# Patient Record
Sex: Female | Born: 1945 | Race: White | Hispanic: No | Smoking: Former smoker
Health system: Southern US, Community
[De-identification: ages and names within clinical notes are randomized; demographics above are authoritative.]

## PROBLEM LIST (undated history)

## (undated) DIAGNOSIS — J449 Chronic obstructive pulmonary disease, unspecified: Secondary | ICD-10-CM

## (undated) DIAGNOSIS — D62 Acute posthemorrhagic anemia: Secondary | ICD-10-CM

## (undated) DIAGNOSIS — K5903 Drug induced constipation: Secondary | ICD-10-CM

## (undated) DIAGNOSIS — I5021 Acute systolic (congestive) heart failure: Secondary | ICD-10-CM

## (undated) DIAGNOSIS — K259 Gastric ulcer, unspecified as acute or chronic, without hemorrhage or perforation: Secondary | ICD-10-CM

## (undated) DIAGNOSIS — S22080A Wedge compression fracture of T11-T12 vertebra, initial encounter for closed fracture: Secondary | ICD-10-CM

## (undated) DIAGNOSIS — I10 Essential (primary) hypertension: Secondary | ICD-10-CM

## (undated) DIAGNOSIS — Z720 Tobacco use: Secondary | ICD-10-CM

## (undated) DIAGNOSIS — F419 Anxiety disorder, unspecified: Secondary | ICD-10-CM

## (undated) DIAGNOSIS — I509 Heart failure, unspecified: Secondary | ICD-10-CM

## (undated) DIAGNOSIS — M1990 Unspecified osteoarthritis, unspecified site: Secondary | ICD-10-CM

## (undated) DIAGNOSIS — N179 Acute kidney failure, unspecified: Secondary | ICD-10-CM

## (undated) DIAGNOSIS — M8580 Other specified disorders of bone density and structure, unspecified site: Secondary | ICD-10-CM

## (undated) DIAGNOSIS — E785 Hyperlipidemia, unspecified: Secondary | ICD-10-CM

## (undated) DIAGNOSIS — K219 Gastro-esophageal reflux disease without esophagitis: Secondary | ICD-10-CM

## (undated) DIAGNOSIS — R570 Cardiogenic shock: Secondary | ICD-10-CM

## (undated) DIAGNOSIS — K449 Diaphragmatic hernia without obstruction or gangrene: Secondary | ICD-10-CM

## (undated) DIAGNOSIS — C3490 Malignant neoplasm of unspecified part of unspecified bronchus or lung: Secondary | ICD-10-CM

## (undated) HISTORY — DX: Wedge compression fracture of T11-T12 vertebra, initial encounter for closed fracture: S22.080A

## (undated) HISTORY — DX: Unspecified osteoarthritis, unspecified site: M19.90

## (undated) HISTORY — DX: Heart failure, unspecified: I50.9

## (undated) HISTORY — DX: Drug induced constipation: K59.03

## (undated) HISTORY — DX: Cardiogenic shock: R57.0

## (undated) HISTORY — DX: Acute kidney failure, unspecified: N17.9

## (undated) HISTORY — DX: Diaphragmatic hernia without obstruction or gangrene: K44.9

## (undated) HISTORY — DX: Anxiety disorder, unspecified: F41.9

## (undated) HISTORY — DX: Acute posthemorrhagic anemia: D62

## (undated) HISTORY — DX: Other specified disorders of bone density and structure, unspecified site: M85.80

## (undated) HISTORY — DX: Gastric ulcer, unspecified as acute or chronic, without hemorrhage or perforation: K25.9

## (undated) HISTORY — DX: Acute systolic (congestive) heart failure: I50.21

---

## 2010-04-05 ENCOUNTER — Emergency Department: Payer: Self-pay | Admitting: Emergency Medicine

## 2012-08-05 HISTORY — PX: LUNG CANCER SURGERY: SHX702

## 2015-12-26 DIAGNOSIS — C3490 Malignant neoplasm of unspecified part of unspecified bronchus or lung: Secondary | ICD-10-CM

## 2016-01-17 DIAGNOSIS — Z85118 Personal history of other malignant neoplasm of bronchus and lung: Secondary | ICD-10-CM

## 2016-04-18 DIAGNOSIS — M8589 Other specified disorders of bone density and structure, multiple sites: Secondary | ICD-10-CM | POA: Diagnosis not present

## 2016-04-18 DIAGNOSIS — Z85118 Personal history of other malignant neoplasm of bronchus and lung: Secondary | ICD-10-CM | POA: Diagnosis not present

## 2016-04-18 DIAGNOSIS — C3412 Malignant neoplasm of upper lobe, left bronchus or lung: Secondary | ICD-10-CM | POA: Diagnosis not present

## 2016-07-15 DIAGNOSIS — R1012 Left upper quadrant pain: Secondary | ICD-10-CM | POA: Diagnosis not present

## 2016-07-16 DIAGNOSIS — J449 Chronic obstructive pulmonary disease, unspecified: Secondary | ICD-10-CM | POA: Diagnosis not present

## 2016-07-16 DIAGNOSIS — S2242XA Multiple fractures of ribs, left side, initial encounter for closed fracture: Secondary | ICD-10-CM | POA: Diagnosis not present

## 2016-07-18 DIAGNOSIS — C3412 Malignant neoplasm of upper lobe, left bronchus or lung: Secondary | ICD-10-CM | POA: Diagnosis not present

## 2016-09-19 DIAGNOSIS — M8589 Other specified disorders of bone density and structure, multiple sites: Secondary | ICD-10-CM | POA: Diagnosis not present

## 2016-09-19 DIAGNOSIS — R978 Other abnormal tumor markers: Secondary | ICD-10-CM | POA: Diagnosis not present

## 2016-09-19 DIAGNOSIS — C3412 Malignant neoplasm of upper lobe, left bronchus or lung: Secondary | ICD-10-CM | POA: Diagnosis not present

## 2016-09-20 DIAGNOSIS — C3412 Malignant neoplasm of upper lobe, left bronchus or lung: Secondary | ICD-10-CM | POA: Diagnosis not present

## 2016-09-20 DIAGNOSIS — R110 Nausea: Secondary | ICD-10-CM | POA: Diagnosis not present

## 2016-09-23 DIAGNOSIS — C3490 Malignant neoplasm of unspecified part of unspecified bronchus or lung: Secondary | ICD-10-CM | POA: Diagnosis not present

## 2016-09-23 DIAGNOSIS — R110 Nausea: Secondary | ICD-10-CM | POA: Diagnosis not present

## 2016-09-23 DIAGNOSIS — C3412 Malignant neoplasm of upper lobe, left bronchus or lung: Secondary | ICD-10-CM | POA: Diagnosis not present

## 2016-10-16 DIAGNOSIS — M8580 Other specified disorders of bone density and structure, unspecified site: Secondary | ICD-10-CM | POA: Diagnosis not present

## 2016-10-16 DIAGNOSIS — D72829 Elevated white blood cell count, unspecified: Secondary | ICD-10-CM | POA: Diagnosis not present

## 2016-10-16 DIAGNOSIS — R110 Nausea: Secondary | ICD-10-CM | POA: Diagnosis not present

## 2016-10-16 DIAGNOSIS — C3490 Malignant neoplasm of unspecified part of unspecified bronchus or lung: Secondary | ICD-10-CM | POA: Diagnosis not present

## 2016-10-16 DIAGNOSIS — E860 Dehydration: Secondary | ICD-10-CM | POA: Diagnosis not present

## 2016-10-16 DIAGNOSIS — R109 Unspecified abdominal pain: Secondary | ICD-10-CM | POA: Diagnosis not present

## 2016-10-16 DIAGNOSIS — M545 Low back pain: Secondary | ICD-10-CM | POA: Diagnosis not present

## 2016-10-17 DIAGNOSIS — G8929 Other chronic pain: Secondary | ICD-10-CM | POA: Diagnosis not present

## 2016-10-17 DIAGNOSIS — Z720 Tobacco use: Secondary | ICD-10-CM | POA: Diagnosis not present

## 2016-10-17 DIAGNOSIS — I21A1 Myocardial infarction type 2: Secondary | ICD-10-CM | POA: Diagnosis not present

## 2016-10-17 DIAGNOSIS — N179 Acute kidney failure, unspecified: Secondary | ICD-10-CM | POA: Diagnosis not present

## 2016-10-17 DIAGNOSIS — Z79891 Long term (current) use of opiate analgesic: Secondary | ICD-10-CM | POA: Diagnosis not present

## 2016-10-17 DIAGNOSIS — R110 Nausea: Secondary | ICD-10-CM | POA: Diagnosis not present

## 2016-10-17 DIAGNOSIS — I249 Acute ischemic heart disease, unspecified: Secondary | ICD-10-CM | POA: Diagnosis not present

## 2016-10-17 DIAGNOSIS — J449 Chronic obstructive pulmonary disease, unspecified: Secondary | ICD-10-CM | POA: Diagnosis not present

## 2016-10-17 DIAGNOSIS — I129 Hypertensive chronic kidney disease with stage 1 through stage 4 chronic kidney disease, or unspecified chronic kidney disease: Secondary | ICD-10-CM | POA: Diagnosis present

## 2016-10-17 DIAGNOSIS — R4182 Altered mental status, unspecified: Secondary | ICD-10-CM | POA: Diagnosis not present

## 2016-10-17 DIAGNOSIS — K253 Acute gastric ulcer without hemorrhage or perforation: Secondary | ICD-10-CM | POA: Diagnosis not present

## 2016-10-17 DIAGNOSIS — R112 Nausea with vomiting, unspecified: Secondary | ICD-10-CM | POA: Diagnosis not present

## 2016-10-17 DIAGNOSIS — K5900 Constipation, unspecified: Secondary | ICD-10-CM | POA: Diagnosis present

## 2016-10-17 DIAGNOSIS — K824 Cholesterolosis of gallbladder: Secondary | ICD-10-CM | POA: Diagnosis not present

## 2016-10-17 DIAGNOSIS — R748 Abnormal levels of other serum enzymes: Secondary | ICD-10-CM | POA: Diagnosis not present

## 2016-10-17 DIAGNOSIS — F17210 Nicotine dependence, cigarettes, uncomplicated: Secondary | ICD-10-CM | POA: Diagnosis present

## 2016-10-17 DIAGNOSIS — K259 Gastric ulcer, unspecified as acute or chronic, without hemorrhage or perforation: Secondary | ICD-10-CM | POA: Diagnosis not present

## 2016-10-17 DIAGNOSIS — D649 Anemia, unspecified: Secondary | ICD-10-CM | POA: Diagnosis not present

## 2016-10-17 DIAGNOSIS — R740 Nonspecific elevation of levels of transaminase and lactic acid dehydrogenase [LDH]: Secondary | ICD-10-CM | POA: Diagnosis present

## 2016-10-17 DIAGNOSIS — I214 Non-ST elevation (NSTEMI) myocardial infarction: Secondary | ICD-10-CM | POA: Diagnosis not present

## 2016-10-17 DIAGNOSIS — D62 Acute posthemorrhagic anemia: Secondary | ICD-10-CM | POA: Diagnosis not present

## 2016-10-17 DIAGNOSIS — I10 Essential (primary) hypertension: Secondary | ICD-10-CM | POA: Diagnosis not present

## 2016-10-17 DIAGNOSIS — N183 Chronic kidney disease, stage 3 (moderate): Secondary | ICD-10-CM | POA: Diagnosis present

## 2016-10-17 DIAGNOSIS — M1990 Unspecified osteoarthritis, unspecified site: Secondary | ICD-10-CM | POA: Diagnosis present

## 2016-10-17 DIAGNOSIS — F419 Anxiety disorder, unspecified: Secondary | ICD-10-CM | POA: Diagnosis present

## 2016-10-17 DIAGNOSIS — K920 Hematemesis: Secondary | ICD-10-CM | POA: Diagnosis not present

## 2016-10-17 DIAGNOSIS — K56699 Other intestinal obstruction unspecified as to partial versus complete obstruction: Secondary | ICD-10-CM | POA: Diagnosis not present

## 2016-10-17 DIAGNOSIS — K449 Diaphragmatic hernia without obstruction or gangrene: Secondary | ICD-10-CM | POA: Diagnosis not present

## 2016-10-17 DIAGNOSIS — Z85118 Personal history of other malignant neoplasm of bronchus and lung: Secondary | ICD-10-CM | POA: Diagnosis not present

## 2016-10-18 DIAGNOSIS — N179 Acute kidney failure, unspecified: Secondary | ICD-10-CM

## 2016-10-18 DIAGNOSIS — K259 Gastric ulcer, unspecified as acute or chronic, without hemorrhage or perforation: Secondary | ICD-10-CM

## 2016-10-18 DIAGNOSIS — D62 Acute posthemorrhagic anemia: Secondary | ICD-10-CM

## 2016-10-18 DIAGNOSIS — K449 Diaphragmatic hernia without obstruction or gangrene: Secondary | ICD-10-CM

## 2016-10-24 DIAGNOSIS — I959 Hypotension, unspecified: Secondary | ICD-10-CM | POA: Diagnosis not present

## 2016-10-24 DIAGNOSIS — K922 Gastrointestinal hemorrhage, unspecified: Secondary | ICD-10-CM | POA: Diagnosis not present

## 2016-10-24 DIAGNOSIS — K253 Acute gastric ulcer without hemorrhage or perforation: Secondary | ICD-10-CM | POA: Diagnosis not present

## 2016-10-24 DIAGNOSIS — K5900 Constipation, unspecified: Secondary | ICD-10-CM | POA: Diagnosis not present

## 2016-10-24 DIAGNOSIS — M818 Other osteoporosis without current pathological fracture: Secondary | ICD-10-CM | POA: Diagnosis not present

## 2016-10-24 DIAGNOSIS — Z79899 Other long term (current) drug therapy: Secondary | ICD-10-CM | POA: Diagnosis not present

## 2016-10-24 DIAGNOSIS — R110 Nausea: Secondary | ICD-10-CM | POA: Diagnosis not present

## 2016-10-24 DIAGNOSIS — Z6825 Body mass index (BMI) 25.0-25.9, adult: Secondary | ICD-10-CM | POA: Diagnosis not present

## 2016-10-24 DIAGNOSIS — M549 Dorsalgia, unspecified: Secondary | ICD-10-CM | POA: Diagnosis not present

## 2016-10-24 DIAGNOSIS — E663 Overweight: Secondary | ICD-10-CM | POA: Diagnosis not present

## 2016-10-25 DIAGNOSIS — E538 Deficiency of other specified B group vitamins: Secondary | ICD-10-CM | POA: Diagnosis not present

## 2016-10-25 DIAGNOSIS — D649 Anemia, unspecified: Secondary | ICD-10-CM | POA: Diagnosis not present

## 2016-10-25 DIAGNOSIS — K253 Acute gastric ulcer without hemorrhage or perforation: Secondary | ICD-10-CM | POA: Diagnosis not present

## 2016-10-25 DIAGNOSIS — K922 Gastrointestinal hemorrhage, unspecified: Secondary | ICD-10-CM | POA: Diagnosis not present

## 2016-10-25 DIAGNOSIS — Z6825 Body mass index (BMI) 25.0-25.9, adult: Secondary | ICD-10-CM | POA: Diagnosis not present

## 2016-10-25 DIAGNOSIS — F17200 Nicotine dependence, unspecified, uncomplicated: Secondary | ICD-10-CM | POA: Diagnosis not present

## 2016-10-25 DIAGNOSIS — K5900 Constipation, unspecified: Secondary | ICD-10-CM | POA: Diagnosis not present

## 2016-10-31 DIAGNOSIS — E538 Deficiency of other specified B group vitamins: Secondary | ICD-10-CM | POA: Diagnosis not present

## 2016-10-31 DIAGNOSIS — K253 Acute gastric ulcer without hemorrhage or perforation: Secondary | ICD-10-CM | POA: Diagnosis not present

## 2016-10-31 DIAGNOSIS — D649 Anemia, unspecified: Secondary | ICD-10-CM | POA: Diagnosis not present

## 2016-10-31 DIAGNOSIS — K5900 Constipation, unspecified: Secondary | ICD-10-CM | POA: Diagnosis not present

## 2016-10-31 DIAGNOSIS — M549 Dorsalgia, unspecified: Secondary | ICD-10-CM | POA: Diagnosis not present

## 2016-10-31 DIAGNOSIS — K922 Gastrointestinal hemorrhage, unspecified: Secondary | ICD-10-CM | POA: Diagnosis not present

## 2016-10-31 DIAGNOSIS — Z6824 Body mass index (BMI) 24.0-24.9, adult: Secondary | ICD-10-CM | POA: Diagnosis not present

## 2016-11-14 DIAGNOSIS — K253 Acute gastric ulcer without hemorrhage or perforation: Secondary | ICD-10-CM | POA: Diagnosis not present

## 2016-11-14 DIAGNOSIS — K5900 Constipation, unspecified: Secondary | ICD-10-CM | POA: Diagnosis not present

## 2016-11-14 DIAGNOSIS — F17200 Nicotine dependence, unspecified, uncomplicated: Secondary | ICD-10-CM | POA: Diagnosis not present

## 2016-11-14 DIAGNOSIS — K259 Gastric ulcer, unspecified as acute or chronic, without hemorrhage or perforation: Secondary | ICD-10-CM | POA: Diagnosis not present

## 2016-11-14 DIAGNOSIS — D649 Anemia, unspecified: Secondary | ICD-10-CM | POA: Diagnosis not present

## 2016-11-14 DIAGNOSIS — K922 Gastrointestinal hemorrhage, unspecified: Secondary | ICD-10-CM | POA: Diagnosis not present

## 2016-11-14 DIAGNOSIS — C3490 Malignant neoplasm of unspecified part of unspecified bronchus or lung: Secondary | ICD-10-CM | POA: Diagnosis not present

## 2016-11-14 DIAGNOSIS — Z6824 Body mass index (BMI) 24.0-24.9, adult: Secondary | ICD-10-CM | POA: Diagnosis not present

## 2016-11-14 DIAGNOSIS — M549 Dorsalgia, unspecified: Secondary | ICD-10-CM | POA: Diagnosis not present

## 2016-11-20 DIAGNOSIS — C3412 Malignant neoplasm of upper lobe, left bronchus or lung: Secondary | ICD-10-CM | POA: Diagnosis not present

## 2016-11-20 DIAGNOSIS — Z85118 Personal history of other malignant neoplasm of bronchus and lung: Secondary | ICD-10-CM | POA: Diagnosis not present

## 2016-11-20 DIAGNOSIS — N289 Disorder of kidney and ureter, unspecified: Secondary | ICD-10-CM | POA: Diagnosis not present

## 2016-11-20 DIAGNOSIS — D500 Iron deficiency anemia secondary to blood loss (chronic): Secondary | ICD-10-CM | POA: Diagnosis not present

## 2016-11-20 DIAGNOSIS — D72829 Elevated white blood cell count, unspecified: Secondary | ICD-10-CM | POA: Diagnosis not present

## 2016-12-16 DIAGNOSIS — F17200 Nicotine dependence, unspecified, uncomplicated: Secondary | ICD-10-CM | POA: Diagnosis not present

## 2016-12-16 DIAGNOSIS — E538 Deficiency of other specified B group vitamins: Secondary | ICD-10-CM | POA: Diagnosis not present

## 2016-12-16 DIAGNOSIS — C3490 Malignant neoplasm of unspecified part of unspecified bronchus or lung: Secondary | ICD-10-CM | POA: Diagnosis not present

## 2016-12-16 DIAGNOSIS — D649 Anemia, unspecified: Secondary | ICD-10-CM | POA: Diagnosis not present

## 2016-12-16 DIAGNOSIS — K5900 Constipation, unspecified: Secondary | ICD-10-CM | POA: Diagnosis not present

## 2016-12-16 DIAGNOSIS — Z6824 Body mass index (BMI) 24.0-24.9, adult: Secondary | ICD-10-CM | POA: Diagnosis not present

## 2016-12-16 DIAGNOSIS — M549 Dorsalgia, unspecified: Secondary | ICD-10-CM | POA: Diagnosis not present

## 2016-12-16 DIAGNOSIS — K922 Gastrointestinal hemorrhage, unspecified: Secondary | ICD-10-CM | POA: Diagnosis not present

## 2017-01-08 DIAGNOSIS — Z79891 Long term (current) use of opiate analgesic: Secondary | ICD-10-CM | POA: Diagnosis not present

## 2017-01-08 DIAGNOSIS — R109 Unspecified abdominal pain: Secondary | ICD-10-CM | POA: Diagnosis not present

## 2017-01-08 DIAGNOSIS — J449 Chronic obstructive pulmonary disease, unspecified: Secondary | ICD-10-CM | POA: Diagnosis not present

## 2017-01-08 DIAGNOSIS — I10 Essential (primary) hypertension: Secondary | ICD-10-CM | POA: Diagnosis not present

## 2017-01-08 DIAGNOSIS — I517 Cardiomegaly: Secondary | ICD-10-CM | POA: Diagnosis not present

## 2017-01-08 DIAGNOSIS — Z720 Tobacco use: Secondary | ICD-10-CM | POA: Diagnosis not present

## 2017-01-08 DIAGNOSIS — Z85118 Personal history of other malignant neoplasm of bronchus and lung: Secondary | ICD-10-CM | POA: Diagnosis not present

## 2017-01-08 DIAGNOSIS — R748 Abnormal levels of other serum enzymes: Secondary | ICD-10-CM | POA: Diagnosis not present

## 2017-01-08 DIAGNOSIS — K219 Gastro-esophageal reflux disease without esophagitis: Secondary | ICD-10-CM | POA: Diagnosis not present

## 2017-01-08 DIAGNOSIS — R110 Nausea: Secondary | ICD-10-CM | POA: Diagnosis not present

## 2017-01-08 DIAGNOSIS — Z862 Personal history of diseases of the blood and blood-forming organs and certain disorders involving the immune mechanism: Secondary | ICD-10-CM | POA: Diagnosis not present

## 2017-01-08 DIAGNOSIS — I214 Non-ST elevation (NSTEMI) myocardial infarction: Secondary | ICD-10-CM | POA: Diagnosis not present

## 2017-01-08 DIAGNOSIS — K2971 Gastritis, unspecified, with bleeding: Secondary | ICD-10-CM | POA: Diagnosis not present

## 2017-01-08 DIAGNOSIS — R918 Other nonspecific abnormal finding of lung field: Secondary | ICD-10-CM | POA: Diagnosis not present

## 2017-01-08 DIAGNOSIS — G8929 Other chronic pain: Secondary | ICD-10-CM | POA: Diagnosis not present

## 2017-01-08 DIAGNOSIS — Z79899 Other long term (current) drug therapy: Secondary | ICD-10-CM | POA: Diagnosis not present

## 2017-01-08 DIAGNOSIS — Z8711 Personal history of peptic ulcer disease: Secondary | ICD-10-CM | POA: Diagnosis not present

## 2017-01-09 ENCOUNTER — Encounter (HOSPITAL_COMMUNITY): Payer: Self-pay | Admitting: *Deleted

## 2017-01-09 ENCOUNTER — Encounter (HOSPITAL_COMMUNITY)
Admission: AD | Disposition: A | Discharge status: Home / Self Care | Payer: Self-pay | Source: Other Acute Inpatient Hospital | Attending: Cardiology

## 2017-01-09 ENCOUNTER — Inpatient Hospital Stay (HOSPITAL_COMMUNITY)
Admission: AD | Admit: 2017-01-09 | Discharge: 2017-01-13 | DRG: 280 | Disposition: A | Discharge status: Home / Self Care | Payer: Medicare Other | Source: Other Acute Inpatient Hospital | Attending: Cardiology | Admitting: Cardiology

## 2017-01-09 DIAGNOSIS — Z923 Personal history of irradiation: Secondary | ICD-10-CM

## 2017-01-09 DIAGNOSIS — R109 Unspecified abdominal pain: Secondary | ICD-10-CM | POA: Diagnosis not present

## 2017-01-09 DIAGNOSIS — I5021 Acute systolic (congestive) heart failure: Secondary | ICD-10-CM

## 2017-01-09 DIAGNOSIS — Z8249 Family history of ischemic heart disease and other diseases of the circulatory system: Secondary | ICD-10-CM | POA: Diagnosis not present

## 2017-01-09 DIAGNOSIS — D649 Anemia, unspecified: Secondary | ICD-10-CM | POA: Diagnosis present

## 2017-01-09 DIAGNOSIS — I428 Other cardiomyopathies: Secondary | ICD-10-CM | POA: Diagnosis present

## 2017-01-09 DIAGNOSIS — F17210 Nicotine dependence, cigarettes, uncomplicated: Secondary | ICD-10-CM | POA: Diagnosis present

## 2017-01-09 DIAGNOSIS — C3490 Malignant neoplasm of unspecified part of unspecified bronchus or lung: Secondary | ICD-10-CM | POA: Diagnosis not present

## 2017-01-09 DIAGNOSIS — I2510 Atherosclerotic heart disease of native coronary artery without angina pectoris: Secondary | ICD-10-CM | POA: Diagnosis present

## 2017-01-09 DIAGNOSIS — R079 Chest pain, unspecified: Secondary | ICD-10-CM | POA: Diagnosis not present

## 2017-01-09 DIAGNOSIS — R570 Cardiogenic shock: Secondary | ICD-10-CM | POA: Diagnosis not present

## 2017-01-09 DIAGNOSIS — J449 Chronic obstructive pulmonary disease, unspecified: Secondary | ICD-10-CM | POA: Diagnosis present

## 2017-01-09 DIAGNOSIS — I360 Nonrheumatic tricuspid (valve) stenosis: Secondary | ICD-10-CM | POA: Diagnosis not present

## 2017-01-09 DIAGNOSIS — E876 Hypokalemia: Secondary | ICD-10-CM | POA: Diagnosis not present

## 2017-01-09 DIAGNOSIS — K219 Gastro-esophageal reflux disease without esophagitis: Secondary | ICD-10-CM | POA: Diagnosis not present

## 2017-01-09 DIAGNOSIS — I10 Essential (primary) hypertension: Secondary | ICD-10-CM | POA: Diagnosis not present

## 2017-01-09 DIAGNOSIS — G8929 Other chronic pain: Secondary | ICD-10-CM | POA: Diagnosis not present

## 2017-01-09 DIAGNOSIS — I2109 ST elevation (STEMI) myocardial infarction involving other coronary artery of anterior wall: Secondary | ICD-10-CM | POA: Diagnosis not present

## 2017-01-09 DIAGNOSIS — R748 Abnormal levels of other serum enzymes: Secondary | ICD-10-CM | POA: Diagnosis not present

## 2017-01-09 DIAGNOSIS — Z85118 Personal history of other malignant neoplasm of bronchus and lung: Secondary | ICD-10-CM | POA: Diagnosis not present

## 2017-01-09 DIAGNOSIS — E785 Hyperlipidemia, unspecified: Secondary | ICD-10-CM | POA: Diagnosis present

## 2017-01-09 DIAGNOSIS — I214 Non-ST elevation (NSTEMI) myocardial infarction: Secondary | ICD-10-CM | POA: Diagnosis present

## 2017-01-09 DIAGNOSIS — R110 Nausea: Secondary | ICD-10-CM | POA: Diagnosis not present

## 2017-01-09 DIAGNOSIS — Z9221 Personal history of antineoplastic chemotherapy: Secondary | ICD-10-CM

## 2017-01-09 DIAGNOSIS — I110 Hypertensive heart disease with heart failure: Secondary | ICD-10-CM | POA: Diagnosis present

## 2017-01-09 DIAGNOSIS — Z8711 Personal history of peptic ulcer disease: Secondary | ICD-10-CM | POA: Diagnosis not present

## 2017-01-09 DIAGNOSIS — I252 Old myocardial infarction: Secondary | ICD-10-CM

## 2017-01-09 HISTORY — DX: Essential (primary) hypertension: I10

## 2017-01-09 HISTORY — DX: Tobacco use: Z72.0

## 2017-01-09 HISTORY — DX: Malignant neoplasm of unspecified part of unspecified bronchus or lung: C34.90

## 2017-01-09 HISTORY — PX: LEFT HEART CATH AND CORONARY ANGIOGRAPHY: CATH118249

## 2017-01-09 HISTORY — DX: Non-ST elevation (NSTEMI) myocardial infarction: I21.4

## 2017-01-09 HISTORY — DX: Chronic obstructive pulmonary disease, unspecified: J44.9

## 2017-01-09 HISTORY — DX: Hyperlipidemia, unspecified: E78.5

## 2017-01-09 HISTORY — DX: Gastro-esophageal reflux disease without esophagitis: K21.9

## 2017-01-09 LAB — TROPONIN I: TROPONIN I: 22.44 ng/mL — AB (ref <0.03)

## 2017-01-09 LAB — CBC
HCT: 35.9 % — ABNORMAL LOW (ref 36.0–46.0)
HEMOGLOBIN: 11.3 g/dL — AB (ref 12.0–15.0)
MCH: 26.9 pg (ref 26.0–34.0)
MCHC: 31.5 g/dL (ref 30.0–36.0)
MCV: 85.5 fL (ref 78.0–100.0)
Platelets: 312 10*3/uL (ref 150–400)
RBC: 4.2 MIL/uL (ref 3.87–5.11)
RDW: 14.9 % (ref 11.5–15.5)
WBC: 12.6 10*3/uL — AB (ref 4.0–10.5)

## 2017-01-09 LAB — MRSA PCR SCREENING: MRSA by PCR: NEGATIVE

## 2017-01-09 LAB — POCT ACTIVATED CLOTTING TIME: ACTIVATED CLOTTING TIME: 307 s

## 2017-01-09 SURGERY — LEFT HEART CATH AND CORONARY ANGIOGRAPHY
Anesthesia: LOCAL

## 2017-01-09 MED ORDER — TICAGRELOR 90 MG PO TABS
ORAL_TABLET | ORAL | Status: DC | PRN
  Administered 2017-01-09: 180 mg via ORAL

## 2017-01-09 MED ORDER — TICAGRELOR 90 MG PO TABS
ORAL_TABLET | ORAL | Status: AC
  Filled 2017-01-09: qty 2

## 2017-01-09 MED ORDER — TICAGRELOR 90 MG PO TABS
90.0000 mg | ORAL_TABLET | Freq: Two times a day (BID) | ORAL | Status: DC
  Administered 2017-01-10 – 2017-01-13 (×7): 90 mg via ORAL
  Filled 2017-01-09 (×7): qty 1

## 2017-01-09 MED ORDER — HEPARIN SODIUM (PORCINE) 1000 UNIT/ML IJ SOLN
INTRAMUSCULAR | Status: AC
  Filled 2017-01-09: qty 1

## 2017-01-09 MED ORDER — NITROGLYCERIN 0.4 MG SL SUBL: 0.4000 mg | SUBLINGUAL_TABLET | SUBLINGUAL | Status: DC | PRN

## 2017-01-09 MED ORDER — HEPARIN (PORCINE) IN NACL 100-0.45 UNIT/ML-% IJ SOLN: 800.0000 [IU]/h | INTRAMUSCULAR | Status: DC

## 2017-01-09 MED ORDER — HEPARIN (PORCINE) IN NACL 2-0.9 UNIT/ML-% IJ SOLN
INTRAMUSCULAR | Status: AC
Start: 2017-01-09 — End: 2017-01-09
  Filled 2017-01-09: qty 1000

## 2017-01-09 MED ORDER — HEPARIN SODIUM (PORCINE) 1000 UNIT/ML IJ SOLN
INTRAMUSCULAR | Status: DC | PRN
  Administered 2017-01-09 (×2): 4000 [IU] via INTRAVENOUS

## 2017-01-09 MED ORDER — HEPARIN (PORCINE) IN NACL 2-0.9 UNIT/ML-% IJ SOLN
INTRAMUSCULAR | Status: DC | PRN
  Administered 2017-01-09: 10 mL via INTRA_ARTERIAL

## 2017-01-09 MED ORDER — SODIUM CHLORIDE 0.9% FLUSH
3.0000 mL | Freq: Two times a day (BID) | INTRAVENOUS | Status: DC
  Administered 2017-01-10 – 2017-01-12 (×6): 3 mL via INTRAVENOUS

## 2017-01-09 MED ORDER — IOPAMIDOL (ISOVUE-370) INJECTION 76%
INTRAVENOUS | Status: AC
Start: 2017-01-09 — End: 2017-01-09
  Filled 2017-01-09: qty 100

## 2017-01-09 MED ORDER — SODIUM CHLORIDE 0.9 % IV SOLN: INTRAVENOUS | Status: DC

## 2017-01-09 MED ORDER — OXYCODONE HCL 5 MG PO TABS
5.0000 mg | ORAL_TABLET | Freq: Four times a day (QID) | ORAL | Status: DC | PRN
  Administered 2017-01-09 – 2017-01-13 (×9): 5 mg via ORAL
  Filled 2017-01-09 (×9): qty 1

## 2017-01-09 MED ORDER — SODIUM CHLORIDE 0.9% FLUSH: 3.0000 mL | INTRAVENOUS | Status: DC | PRN

## 2017-01-09 MED ORDER — IOPAMIDOL (ISOVUE-370) INJECTION 76%
INTRAVENOUS | Status: AC
Start: 2017-01-09 — End: 2017-01-09
  Filled 2017-01-09: qty 50

## 2017-01-09 MED ORDER — SODIUM CHLORIDE 0.9% FLUSH: 3.0000 mL | Freq: Two times a day (BID) | INTRAVENOUS | Status: DC

## 2017-01-09 MED ORDER — ACETAMINOPHEN 325 MG PO TABS: 650.0000 mg | ORAL_TABLET | ORAL | Status: DC | PRN

## 2017-01-09 MED ORDER — IOPAMIDOL (ISOVUE-370) INJECTION 76%
INTRAVENOUS | Status: DC | PRN
  Administered 2017-01-09: 150 mL

## 2017-01-09 MED ORDER — LIDOCAINE HCL (PF) 1 % IJ SOLN
INTRAMUSCULAR | Status: DC | PRN
  Administered 2017-01-09: 2 mL

## 2017-01-09 MED ORDER — VERAPAMIL HCL 2.5 MG/ML IV SOLN
INTRAVENOUS | Status: AC
  Filled 2017-01-09: qty 2

## 2017-01-09 MED ORDER — SODIUM CHLORIDE 0.9 % IV SOLN
INTRAVENOUS | Status: DC
  Administered 2017-01-09: 20:00:00 via INTRAVENOUS

## 2017-01-09 MED ORDER — LIDOCAINE HCL 1 % IJ SOLN
INTRAMUSCULAR | Status: AC
  Filled 2017-01-09: qty 20

## 2017-01-09 MED ORDER — OXYCODONE-ACETAMINOPHEN 10-325 MG PO TABS: 1.0000 | ORAL_TABLET | Freq: Four times a day (QID) | ORAL | Status: DC | PRN

## 2017-01-09 MED ORDER — SODIUM CHLORIDE 0.9 % IV BOLUS (SEPSIS)
250.0000 mL | Freq: Once | INTRAVENOUS | Status: AC
  Administered 2017-01-09: 250 mL via INTRAVENOUS

## 2017-01-09 MED ORDER — SODIUM CHLORIDE 0.9 % IV SOLN: 250.0000 mL | INTRAVENOUS | Status: DC | PRN

## 2017-01-09 MED ORDER — HEPARIN (PORCINE) IN NACL 2-0.9 UNIT/ML-% IJ SOLN
INTRAMUSCULAR | Status: DC | PRN
  Administered 2017-01-09: 18:00:00

## 2017-01-09 MED ORDER — PANTOPRAZOLE SODIUM 40 MG PO TBEC
40.0000 mg | DELAYED_RELEASE_TABLET | Freq: Two times a day (BID) | ORAL | Status: DC
  Administered 2017-01-09 – 2017-01-13 (×8): 40 mg via ORAL
  Filled 2017-01-09 (×8): qty 1

## 2017-01-09 MED ORDER — MORPHINE SULFATE ER 15 MG PO TBCR
15.0000 mg | EXTENDED_RELEASE_TABLET | Freq: Two times a day (BID) | ORAL | Status: DC
  Administered 2017-01-09 – 2017-01-13 (×8): 15 mg via ORAL
  Filled 2017-01-09 (×8): qty 1

## 2017-01-09 MED ORDER — OXYCODONE-ACETAMINOPHEN 5-325 MG PO TABS
1.0000 | ORAL_TABLET | Freq: Four times a day (QID) | ORAL | Status: DC | PRN
  Administered 2017-01-09 – 2017-01-13 (×8): 1 via ORAL
  Filled 2017-01-09 (×8): qty 1

## 2017-01-09 MED ORDER — ONDANSETRON HCL 4 MG/2ML IJ SOLN: 4.0000 mg | Freq: Four times a day (QID) | INTRAMUSCULAR | Status: DC | PRN

## 2017-01-09 MED ORDER — ASPIRIN 81 MG PO CHEW: 81.0000 mg | CHEWABLE_TABLET | ORAL | Status: DC

## 2017-01-09 MED ORDER — LABETALOL HCL 5 MG/ML IV SOLN: 10.0000 mg | INTRAVENOUS | Status: AC | PRN

## 2017-01-09 MED ORDER — ATORVASTATIN CALCIUM 80 MG PO TABS
80.0000 mg | ORAL_TABLET | Freq: Every day | ORAL | Status: DC
  Administered 2017-01-09 – 2017-01-12 (×4): 80 mg via ORAL
  Filled 2017-01-09 (×5): qty 1

## 2017-01-09 MED ORDER — ASPIRIN EC 81 MG PO TBEC
81.0000 mg | DELAYED_RELEASE_TABLET | Freq: Every day | ORAL | Status: DC
  Administered 2017-01-10 – 2017-01-13 (×4): 81 mg via ORAL
  Filled 2017-01-09 (×4): qty 1

## 2017-01-09 MED ORDER — HYDRALAZINE HCL 20 MG/ML IJ SOLN: 5.0000 mg | INTRAMUSCULAR | Status: AC | PRN

## 2017-01-09 SURGICAL SUPPLY — 16 items
CATH INFINITI 5 FR JL3.5 (CATHETERS) ×2 IMPLANT
CATH INFINITI JR4 5F (CATHETERS) ×2 IMPLANT
CATH LAUNCHER 5F EBU3.0 (CATHETERS) ×1 IMPLANT
CATH VISTA GUIDE 6FRXBLAD3.5SH (CATHETERS) ×2 IMPLANT
CATHETER LAUNCHER 5F EBU3.0 (CATHETERS) ×2
DEVICE RAD COMP TR BAND LRG (VASCULAR PRODUCTS) ×2 IMPLANT
GLIDESHEATH SLEND SS 6F .021 (SHEATH) ×2 IMPLANT
GUIDEWIRE INQWIRE 1.5J.035X260 (WIRE) ×1 IMPLANT
INQWIRE 1.5J .035X260CM (WIRE) ×2
KIT ENCORE 26 ADVANTAGE (KITS) ×2 IMPLANT
KIT HEART LEFT (KITS) ×2 IMPLANT
PACK CARDIAC CATHETERIZATION (CUSTOM PROCEDURE TRAY) ×2 IMPLANT
TRANSDUCER W/STOPCOCK (MISCELLANEOUS) ×2 IMPLANT
TUBING CIL FLEX 10 FLL-RA (TUBING) ×2 IMPLANT
WIRE COUGAR XT STRL 190CM (WIRE) ×6 IMPLANT
WIRE HI TORQ WHISPER MS 190CM (WIRE) ×2 IMPLANT

## 2017-01-09 NOTE — Progress Notes (Signed)
Cardiology fellow updated on pt's hypotension SBP 50-70s. Verbal order received to administer 250 cc NS bolus. Will implement and continue to monitor pt closely.

## 2017-01-09 NOTE — Progress Notes (Signed)
Nurse called indicated hypotension Examined patient and BP anywhere from 72-98 Some improvement with saline Currently getting 100cc/hr No signs of CHF  Patient asymptomatic Lungs clear No murmur Abdomen soft  Hct stable 35.9 ECG with evolved anterior MI loss of R waves Telemetry with NSR no ectopy  Bedside Echo performed by myself  Trivial pericardial effusion No mechanical complication of MI No MR/VSD RV normal and IVC flat EF 35% with RWMAls in both LAD/RCA territory suggesting Takatsubo or wrap around LAD infarct  Plan:  Discussed with Dr Ellyn Hack patient to cath lab hydrate has port a cath For access if dopamine needed for support during any intervention Daugther Updated at bedside. Consent for cath signed  Total direct patient care time and performing echo 40 minutes  Jenkins Rouge

## 2017-01-09 NOTE — Interval H&P Note (Signed)
History and Physical Interval Note:  01/09/2017 5:37 PM  Ashley Miles  has presented today for cardiac cath with the diagnosis of chest pain/NSTEMI.  The various methods of treatment have been discussed with the patient and family. After consideration of risks, benefits and other options for treatment, the patient has consented to  Procedure(s): Left Heart Cath and Coronary Angiography (N/A) as a surgical intervention .  The patient's history has been reviewed, patient examined, no change in status, stable for surgery.  I have reviewed the patient's chart and labs.  Questions were answered to the patient's satisfaction.    Cath Lab Visit (complete for each Cath Lab visit)  Clinical Evaluation Leading to the Procedure:   ACS: Yes.    Non-ACS:    Anginal Classification: CCS III  Anti-ischemic medical therapy: No Therapy  Non-Invasive Test Results: No non-invasive testing performed  Prior CABG: No previous CABG           [name]

## 2017-01-09 NOTE — Progress Notes (Signed)
CRITICAL VALUE ALERT  Critical Value:  Troponin 22.44  Date & Time Notied: 01/09/17  Provider Notified: No - this is an expected value that is consistent with previous results  Orders Received/Actions taken: Will trend value with follow labs.

## 2017-01-09 NOTE — H&P (Signed)
History & Physical    Patient ID: Ashley Miles MRN: [id number], DOB/AGE: [birth year]   Admit date: 01/09/2017   Primary Physician: Ocie Doyne., MD Primary Cardiologist: Agustin Cree   Patient Profile    71 yo female with PMH of COPD, GERD, anemia, small cell lung Ca s/p chemo/radiation, chronic pain and tobacco use who presented to [facility] with nausea, abd pain and vomiting.   Past Medical History    Past Medical History:  Diagnosis Date  . COPD (chronic obstructive pulmonary disease) ([place])   . GERD (gastroesophageal reflux disease)   . Hyperlipidemia   . Hypertension   . Small cell lung cancer ([place])   . Tobacco use     No past surgical history on file.   Allergies  No Known Allergies  History of Present Illness    71 yo female with PMH of COPD, GERD, anemia, small cell lung Ca s/p chemo/radiation, and tobacco use. Underwent chemo and radiation about 3/4 years ago and was followed by oncology in [place]. Reports being hospitalized in March with hematemesis, and had an elevated troponin at that time that was felt to be demand ischemia. Cardiology was consulted at that time, but no intervention was felt necessary. Had an echo that admission reported as normal.    Reports she developed a sudden onset of epigastric pain yesterday with nausea. She vomited twice. Presented to the ED at [facility]. Troponin peaked at 23. She was started on IV heparin. Echo there showed newly reduced EF of 30-35% with diffuse akinesis. EKG today showed SR with deep TWI in anterolateral leads. She was transferred to [facility] for cardiac cath after being seen by Dr. Agustin Cree. On arrival to the unit was noted to be hypotensive with systolic blood pressure in the 70s, but asymptomatic. No chest pain, or dyspnea.   Home Medications    Prior to Admission medications   Not on File    Family History     Family History  Problem Relation Age of Onset  . Hypertension Father     Social History      Social History   Social History  . Marital status: Divorced    Spouse name: N/A  . Number of children: N/A  . Years of education: N/A   Occupational History  . Not on file.   Social History Main Topics  . Smoking status: Current Every Day Smoker    Types: Cigarettes  . Smokeless tobacco: Never Used  . Alcohol use Not on file  . Drug use: Unknown  . Sexual activity: Not on file   Other Topics Concern  . Not on file   Social History Narrative  . No narrative on file     Review of Systems    See HPI  All other systems reviewed and are otherwise negative except as noted above.  Physical Exam    Blood pressure (!) 91/42, pulse 79, resp. rate 18, height 5\' 6"  (1.676 m), weight 145 lb 8.1 oz (66 kg), SpO2 97 %.  General: Pleasant, older female NAD Psych: Normal affect. Neuro: Alert and oriented X 3. Moves all extremities spontaneously. HEENT: Normal  Neck: Supple without bruits or JVD. Lungs:  Resp regular and unlabored, CTA. Heart: RRR no s3, s4, or murmurs. Abdomen: Soft, non-tender, non-distended, BS + x 4.  Extremities: No clubbing, cyanosis or edema. DP/PT/Radials 2+ and equal bilaterally.  Labs    Troponin (Point of Care Test) No results for input(s): TROPIPOC in the last 72 hours.  No results for input(s): CKTOTAL, CKMB, TROPONINI in the last 72 hours. Lab Results  Component Value Date   WBC 12.6 (H) 01/09/2017   HGB 11.3 (L) 01/09/2017   HCT 35.9 (L) 01/09/2017   MCV 85.5 01/09/2017   PLT 312 01/09/2017   No results for input(s): NA, K, CL, CO2, BUN, CREATININE, CALCIUM, PROT, BILITOT, ALKPHOS, ALT, AST, GLUCOSE in the last 168 hours.  Invalid input(s): LABALBU No results found for: CHOL, HDL, LDLCALC, TRIG No results found for: [facility]   Radiology Studies    No results found.  ECG & Cardiac Imaging    EKG: SR with deep TWI in anterolateral leads  Echo: 01/08/17 (At [facility])  Newly reduced EF of 30-35% with diffuse akinesis  Assessment & Plan     71 yo female with PMH of COPD, GERD, anemia, small cell lung Ca s/p chemo/radiation, chronic pain and tobacco use who presented to [facility] with nausea, abd pain and vomiting.   1. NSTEMI: Presented to [facility] with atypical symptoms of abd pain, with nausea. Trop peaked at 23. Echo with new EF of 30%. EKG with TWI in anterolateral leads. Given these findings it was recommended that she undergo cath after being seen by Dr. Agustin Cree. Hgb A1c 5.6. Cr 0.9, Hgb 13.5. Suspicious of RCA infarct given hypotension.  -- continue IV heparin  -- no BB at this time as she is hypotensive on admission -- plan cardiac cath -- The patient understands that risks included but are not limited to stroke (1 in 1000), death (1 in 1000), kidney failure [usually temporary] (1 in 500), bleeding (1 in 200), allergic reaction [possibly serious] (1 in 200).   2. HL: LDL 162 at [place], AST 159 -- recheck CMET, consider the addition of statin therapy  3. COPD: stable  4. Small Cell Lung Ca s/p chemo, radiation: Followed in [place]  5. Anemia: Hgb 13.5 this morning. Hypotensive on arrival, thought asymtomatic -- recheck CBC  6. Hypotension: Initially in the 77O systolic on arrival, but improved to the 90s.  -- will hold on aggressive IVFs given reduced EF.   Barnet Pall, NP-C Pager [phone] 01/09/2017, 3:38 PM   Patient examined chart reviewed. Atypical symptoms history of ulcer mostly epigastric pain And nausea. ECG with evolved anterior MI no ST elevation now. Suspect subacute LAD occlusion Vs Takatsubo DCM.  Hct stable at 39  Exam with pale chronically ill female Portacath right subclavian from cancer Rx. Lungs clear no murmur no edema abdomen soft Plus 2 radial pulse Discussed cath with patient and daughter. Willing to proceed Lab called orders written should be next case for Dr Robyne Askew

## 2017-01-10 ENCOUNTER — Encounter (HOSPITAL_COMMUNITY): Payer: Self-pay | Admitting: Cardiovascular Disease

## 2017-01-10 ENCOUNTER — Inpatient Hospital Stay (HOSPITAL_COMMUNITY): Payer: Medicare Other

## 2017-01-10 DIAGNOSIS — I360 Nonrheumatic tricuspid (valve) stenosis: Secondary | ICD-10-CM

## 2017-01-10 LAB — CBC
HCT: 31 % — ABNORMAL LOW (ref 36.0–46.0)
Hemoglobin: 9.7 g/dL — ABNORMAL LOW (ref 12.0–15.0)
MCH: 26.9 pg (ref 26.0–34.0)
MCHC: 31.3 g/dL (ref 30.0–36.0)
MCV: 85.9 fL (ref 78.0–100.0)
PLATELETS: 274 10*3/uL (ref 150–400)
RBC: 3.61 MIL/uL — ABNORMAL LOW (ref 3.87–5.11)
RDW: 15.2 % (ref 11.5–15.5)
WBC: 9.5 10*3/uL (ref 4.0–10.5)

## 2017-01-10 LAB — HEMOGLOBIN AND HEMATOCRIT, BLOOD
HEMATOCRIT: 31.8 % — AB (ref 36.0–46.0)
HEMOGLOBIN: 10 g/dL — AB (ref 12.0–15.0)

## 2017-01-10 LAB — COMPREHENSIVE METABOLIC PANEL
ALBUMIN: 2.4 g/dL — AB (ref 3.5–5.0)
ALT: 13 U/L — ABNORMAL LOW (ref 14–54)
ANION GAP: 7 (ref 5–15)
AST: 57 U/L — ABNORMAL HIGH (ref 15–41)
Alkaline Phosphatase: 48 U/L (ref 38–126)
BILIRUBIN TOTAL: 0.7 mg/dL (ref 0.3–1.2)
BUN: 18 mg/dL (ref 6–20)
CALCIUM: 7.8 mg/dL — AB (ref 8.9–10.3)
CO2: 22 mmol/L (ref 22–32)
Chloride: 103 mmol/L (ref 101–111)
Creatinine, Ser: 1.09 mg/dL — ABNORMAL HIGH (ref 0.44–1.00)
GFR calc non Af Amer: 50 mL/min — ABNORMAL LOW (ref >60)
GFR, EST AFRICAN AMERICAN: 58 mL/min — AB (ref >60)
GLUCOSE: 88 mg/dL (ref 65–99)
POTASSIUM: 3.4 mmol/L — AB (ref 3.5–5.1)
SODIUM: 132 mmol/L — AB (ref 135–145)
TOTAL PROTEIN: 4.9 g/dL — AB (ref 6.5–8.1)

## 2017-01-10 LAB — BASIC METABOLIC PANEL
Anion gap: 6 (ref 5–15)
BUN: 17 mg/dL (ref 6–20)
CHLORIDE: 103 mmol/L (ref 101–111)
CO2: 24 mmol/L (ref 22–32)
CREATININE: 1.1 mg/dL — AB (ref 0.44–1.00)
Calcium: 7.9 mg/dL — ABNORMAL LOW (ref 8.9–10.3)
GFR calc Af Amer: 58 mL/min — ABNORMAL LOW (ref >60)
GFR calc non Af Amer: 50 mL/min — ABNORMAL LOW (ref >60)
Glucose, Bld: 94 mg/dL (ref 65–99)
Potassium: 3.4 mmol/L — ABNORMAL LOW (ref 3.5–5.1)
SODIUM: 133 mmol/L — AB (ref 135–145)

## 2017-01-10 LAB — LACTIC ACID, PLASMA: LACTIC ACID, VENOUS: 1.4 mmol/L (ref 0.5–1.9)

## 2017-01-10 LAB — TYPE AND SCREEN
ABO/RH(D): A POS
Antibody Screen: NEGATIVE

## 2017-01-10 LAB — ECHOCARDIOGRAM COMPLETE
Height: 66 in
Weight: 2328.06 oz

## 2017-01-10 LAB — TROPONIN I
TROPONIN I: 19.75 ng/mL — AB (ref <0.03)
Troponin I: 24.11 ng/mL (ref <0.03)

## 2017-01-10 LAB — ABO/RH: ABO/RH(D): A POS

## 2017-01-10 MED ORDER — NICOTINE 14 MG/24HR TD PT24
14.0000 mg | MEDICATED_PATCH | Freq: Every day | TRANSDERMAL | Status: DC
  Administered 2017-01-10 – 2017-01-13 (×4): 14 mg via TRANSDERMAL
  Filled 2017-01-10 (×4): qty 1

## 2017-01-10 MED ORDER — ADULT MULTIVITAMIN W/MINERALS CH
1.0000 | ORAL_TABLET | Freq: Every day | ORAL | Status: DC
  Administered 2017-01-10 – 2017-01-13 (×4): 1 via ORAL
  Filled 2017-01-10 (×4): qty 1

## 2017-01-10 MED ORDER — METOPROLOL TARTRATE 12.5 MG HALF TABLET
12.5000 mg | ORAL_TABLET | Freq: Two times a day (BID) | ORAL | Status: DC
  Administered 2017-01-10 – 2017-01-13 (×7): 12.5 mg via ORAL
  Filled 2017-01-10 (×7): qty 1

## 2017-01-10 MED ORDER — POTASSIUM CHLORIDE CRYS ER 20 MEQ PO TBCR
40.0000 meq | EXTENDED_RELEASE_TABLET | Freq: Once | ORAL | Status: AC
  Administered 2017-01-10: 40 meq via ORAL
  Filled 2017-01-10: qty 2

## 2017-01-10 NOTE — Progress Notes (Signed)
EKG CRITICAL VALUE     12 lead EKG performed.  Critical value noted.  Janett Billow, RN notified.   Martie Lee, CCT 01/10/2017 7:01 AM

## 2017-01-10 NOTE — Care Management Note (Signed)
Case Management Note Marvetta Gibbons RN, BSN Unit 2W-Case Manager-- [place] coverage [phone]  Patient Details  Name: Ashley Miles MRN: [id number] Date of Birth: 16-Aug-1945  Subjective/Objective:  Nstemi- admitted on transfer from [facility] on 6/7 with n, v, abd pain w/ subacute anterior MI  cath w/ occluded diag  unsuccessful PCI  med rx.                  Action/Plan: PTA pt lived at home- per MD note- plan for [facility] consult- CM to follow  Expected Discharge Date:                  Expected Discharge Plan:     In-House Referral:     Discharge planning Services  CM Consult  Post Acute Care Choice:    Choice offered to:     DME Arranged:    DME Agency:     HH Arranged:    [place] Agency:     Status of Service:  In process, will continue to follow  If discussed at Long Length of Stay Meetings, dates discussed:    Discharge Disposition:   Additional Comments:  Dawayne Patricia, RN 01/10/2017, 10:12 AM

## 2017-01-10 NOTE — Progress Notes (Signed)
Progress Note  Patient Name: Ashley Miles Date of Encounter: 01/10/2017  Primary Cardiologist: Ashley Miles  Subjective   No chest pain, sob, n, v.    Inpatient Medications    Scheduled Meds: . aspirin EC  81 mg Oral Daily  . atorvastatin  80 mg Oral q1800  . morphine  15 mg Oral BID  . pantoprazole  40 mg Oral BID  . sodium chloride flush  3 mL Intravenous Q12H  . ticagrelor  90 mg Oral BID   Continuous Infusions: . sodium chloride     PRN Meds: sodium chloride, acetaminophen, nitroGLYCERIN, ondansetron (ZOFRAN) IV, oxyCODONE-acetaminophen **AND** oxyCODONE, sodium chloride flush   Vital Signs    Vitals:   01/10/17 0630 01/10/17 0645 01/10/17 0700 01/10/17 0800  BP: (!) 107/53 (!) 110/51 (!) 116/51   Pulse: 82 84 81   Resp: 14 13 15    Temp:    99.3 F (37.4 C)  TempSrc:    Oral  SpO2: 96% 95% 96%   Weight:      Height:        Intake/Output Summary (Last 24 hours) at 01/10/17 0946 Last data filed at 01/10/17 0700  Gross per 24 hour  Intake             1110 ml  Output              200 ml  Net              910 ml   Filed Weights   01/09/17 1430  Weight: 145 lb 8.1 oz (66 kg)    Physical Exam   GEN: Well nourished, well developed, in no acute distress.  HEENT: Grossly normal.  Neck: Supple, no JVD, carotid bruits, or masses. Cardiac: RRR, no murmurs, rubs, or gallops. No clubbing, cyanosis, edema.  Radials/DP/PT 2+ and equal bilaterally. R wrist cath site w/o bleeding/bruit/hematoma. Respiratory:  Respirations regular and unlabored, coarse breath sounds bilat with exp wheezing. GI: Soft, nontender, nondistended, BS + x 4. MS: no deformity or atrophy. Skin: warm and dry, no rash. Neuro:  Strength and sensation are intact. Psych: AAOx3.  Normal affect.  Labs    Chemistry Recent Labs Lab 01/09/17 2350 01/10/17 0452  NA 132* 133*  K 3.4* 3.4*  CL 103 103  CO2 22 24  GLUCOSE 88 94  BUN 18 17  CREATININE 1.09* 1.10*  CALCIUM 7.8* 7.9*  PROT  4.9*  --   ALBUMIN 2.4*  --   AST 57*  --   ALT 13*  --   ALKPHOS 48  --   BILITOT 0.7  --   GFRNONAA 50* 50*  GFRAA 58* 58*  ANIONGAP 7 6     Hematology Recent Labs Lab 01/09/17 1520 01/10/17 0801  WBC 12.6* 9.5  RBC 4.20 3.61*  HGB 11.3* 9.7*  HCT 35.9* 31.0*  MCV 85.5 85.9  MCH 26.9 26.9  MCHC 31.5 31.3  RDW 14.9 15.2  PLT 312 274    Cardiac Enzymes Recent Labs Lab 01/09/17 2137 01/09/17 2350 01/10/17 0452  TROPONINI 22.44* 24.11* 19.75*       Radiology    No results found.  Telemetry    Sinus, pvc's - Personally Reviewed  ECG    RSR 81, inf, anterolateral deep twi with mild anterolateral ST elev - similar to yesterday - Personally Reviewed  Cardiac Studies   Cardiac Catheterization 6.7.2018   Ost RCA lesion, 80 %stenosed.  Prox RCA lesion, 70 %stenosed.  Ramus lesion,  60 %stenosed.  Ost 2nd Diag to 2nd Diag lesion, 100 %stenosed.  Mid LAD lesion, 30 %stenosed.   1. Cardiomyopathy in setting of elevated troponin. The diagonal is occluded. This may have occurred in the last 36 hours. This could explain the elevated troponin. The small non-dominant RCA has severe disease. The degree of LV systolic dysfunction is out of proportion to the degree of CAD. 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  2. Unsuccessful attempt at PCI of the occluded Diagonal branch 3. Cardiogenic shock   Recommendations: I will admit her to the ICU. At this time, I do not think that she needs a hemodynamic support device. Her LV filling pressure is low. Will attempt gentle hydration. No good options for PCI. Medical management of CAD and cardiomyopathy. I have reviewed her films with the rounding team and the IC team. I have suggested that she consider stopping smoking given her end stage lung disease, h/o lung cancer and now cardiomyopathy. Consider palliative care consult to begin planning for end of life issues.  _____________   Patient  Profile     71 y.o. female w/ a h/o COPD, GERD, PUD, anemia, small cell lung cancer s/p chemo/radiation, chronic pain, and tobacco abuse, who was admitted on transfer from [facility] on 6/7 with n, v, abd pain w/ subacute anterior MI  cath w/ occluded diag  unsuccessful PCI  med rx.  Assessment & Plan    1.  NSTEMI/Evolved anterior MI/CAD/Cardiogenic shock:  Pt admitted on tx from [facility] on 6/7 with a h/o n, v, and anterior ST/T changes on ECG.  Cath revealed an occluded diag and moderate dzs in a nondominant RCA.  Attempt was made @ PCI of the diag but was unsuccessful, thus she will be medically managed.  She has been hypotensive but has not required vasopressors.  No recurrent nausea/vomiting.  No chest pain.  Troponin peaked @ 24.11 and now trending down.  Cont asa, brilinta (consider switch to plavix in setting of Med rx only), and high potency statin therapy.  No  blocker added yesterday due to hypotension.  Will add low dose today and see if she tolerates.  If so, can consider low dose arb next.  F/u echo.  Cardiac rehab.  Smoking cessation.  2.  Hypotension:  In setting of above/cardiogenic shock.  BPs better this am.  Follow on low dose  blocker w/ strict hold parameters.  3.  HL:  F/u lipids.  Cont high potency statin therapy.  4.  Normocytic anemia:  In setting of PUD with peptic ulcers dx in 10/2016.  Cont BID PPI.  Guaiac stools.  Type and screen and f/u H/H later today and in am.  She denies any recent dark stools/blood in stools.  5.  Tob Abuse/COPD:  Says she has quit as of this admission.  Add nicotine patch (one was placed @ [facility], but no order here).  Smoking cessation advised.  6.  Hypokalemia:  Supplemented this am.  Signed, Ashley Hodgkins, NP  01/10/2017, 9:46 AM    Patient examined chart reviewed. Large diagonal occlusion with ? Component Takatsubo unsuccessful attempt at PCI/ EF 35% [facility] Rx limited by low BP Start low dose beta blocker. Exam with pale  chronically ill female Port a cath under right clavicle Rhonchi no murmur no LE edema.  Needs to stop smoking follow anemia. Continue Brillinta for now given troponin 20 but may need to stop if anemia gets Worse Recent w/u  for ulcer guaiac stools  Overall prognosis poor  Jenkins Rouge

## 2017-01-10 NOTE — Progress Notes (Signed)
Dr. Grandville Silos (Cardiology) updated on pt's morning labs - K+ 3.4, will be replaced with 40 mEq PO.

## 2017-01-10 NOTE — Progress Notes (Signed)
Initial Nutrition Assessment  INTERVENTION:   Magic cup TID with meals, each supplement provides 290 kcal and 9 grams of protein  MVI daily  Encouraged adequate nutrition intake at home to prevent weight loss.   NUTRITION DIAGNOSIS:   Inadequate oral intake related to  (decreased appetite) as evidenced by per patient/family report.  GOAL:   Patient will meet greater than or equal to 90% of their needs  MONITOR:   PO intake, Supplement acceptance, Weight trends  REASON FOR ASSESSMENT:   Malnutrition Screening Tool    ASSESSMENT:   Pt with PMH of COPD, GERD, anemia, small cell lung Ca s/p chemo/radiation, chronic pain and tobacco use admitted with nausea, abd pain and vomiting transferred to [facility] ICU for NSTEMI and planned cath lab.    Pt reports weight loss starting in March 2018 due to bleeding ulcer but has seen no changes in how her clothes are fitting. She reports decreased appetite.  Pt lives alone and prepares her own meals. Breakfast is cereal, lunch is sandwich and chips, dinner is meat and vegetable. She does reports sometimes skipping meals.  Pt reports no change to her functional status.    Nutrition-Focused physical exam completed. Findings are no fat depletion, mild/moderate muscle depletion hands and calves, and no edema.   Labs reviewed: K+ 3.4 (L) Meal Completion: 50%  Diet Order:  Diet Heart Room service appropriate? Yes; Fluid consistency: Thin  Skin:  Reviewed, no issues  Last BM:  6/7  Height:   Ht Readings from Last 1 Encounters:  01/09/17 5\' 6"  (1.676 m)    Weight:   Wt Readings from Last 1 Encounters:  01/09/17 145 lb 8.1 oz (66 kg)    Ideal Body Weight:  59 kg  BMI:  Body mass index is 23.48 kg/m.  Estimated Nutritional Needs:   Kcal:  1600-1800  Protein:  75-85 grams  Fluid:  >/= 1.6 L/day  EDUCATION NEEDS:   Education needs addressed  Maylon Peppers RD, [place], [place] Pager [phone] After Hours Pager

## 2017-01-11 LAB — CBC
HCT: 31.3 % — ABNORMAL LOW (ref 36.0–46.0)
Hemoglobin: 9.9 g/dL — ABNORMAL LOW (ref 12.0–15.0)
MCH: 27.3 pg (ref 26.0–34.0)
MCHC: 31.6 g/dL (ref 30.0–36.0)
MCV: 86.2 fL (ref 78.0–100.0)
PLATELETS: 273 10*3/uL (ref 150–400)
RBC: 3.63 MIL/uL — ABNORMAL LOW (ref 3.87–5.11)
RDW: 15 % (ref 11.5–15.5)
WBC: 9.3 10*3/uL (ref 4.0–10.5)

## 2017-01-11 LAB — LIPID PANEL
Cholesterol: 164 mg/dL (ref 0–200)
HDL: 33 mg/dL — AB (ref >40)
LDL Cholesterol: 104 mg/dL — ABNORMAL HIGH (ref 0–99)
Total CHOL/HDL Ratio: 5 RATIO
Triglycerides: 133 mg/dL (ref <150)
VLDL: 27 mg/dL (ref 0–40)

## 2017-01-11 LAB — BASIC METABOLIC PANEL
ANION GAP: 8 (ref 5–15)
BUN: 14 mg/dL (ref 6–20)
CHLORIDE: 103 mmol/L (ref 101–111)
CO2: 23 mmol/L (ref 22–32)
Calcium: 8.1 mg/dL — ABNORMAL LOW (ref 8.9–10.3)
Creatinine, Ser: 1.03 mg/dL — ABNORMAL HIGH (ref 0.44–1.00)
GFR calc Af Amer: >60 mL/min (ref >60)
GFR calc non Af Amer: 54 mL/min — ABNORMAL LOW (ref >60)
Glucose, Bld: 102 mg/dL — ABNORMAL HIGH (ref 65–99)
POTASSIUM: 4 mmol/L (ref 3.5–5.1)
SODIUM: 134 mmol/L — AB (ref 135–145)

## 2017-01-11 MED ORDER — LORAZEPAM 0.5 MG PO TABS
0.5000 mg | ORAL_TABLET | Freq: Once | ORAL | Status: AC
  Administered 2017-01-11: 0.5 mg via ORAL
  Filled 2017-01-11: qty 1

## 2017-01-11 NOTE — Progress Notes (Signed)
CARDIAC REHAB PHASE I   PRE:  Rate/Rhythm: 75 SR  BP:  Supine: 119/59 Sitting:   Standing:    SaO2: 100% RA  MODE:  Ambulation: 150 ft   POST:  Rate/Rhythm: 77 SR  BP:  Supine: 121/54 Sitting:   Standing:    SaO2: 100% RA  [id number] Patient ambulated 150 ft with assist x2 and pushing a rollator. Gait slow, steady, one standing rest break taken. To bed after ambulation, VSS. MI education complete with patient and patient's family including restrictions, Brilinta use, CP, NTG use and calling 911, risk factor modification, tobacco cessation and activity progression. Pt verbalizes understanding of instructions given. MI book given. Discussed Phase 2 cardiac rehab, and patient is interested in the program at [facility], will send referral.  Seward Carol, MS, ACSM CEP

## 2017-01-11 NOTE — Progress Notes (Signed)
Progress Note  Patient Name: Ashley Miles Date of Encounter: 01/11/2017  Primary Cardiologist: Agustin Cree  Subjective   Denies chest pain or shortness of breath. Wants to go home.  Inpatient Medications    Scheduled Meds: . aspirin EC  81 mg Oral Daily  . atorvastatin  80 mg Oral q1800  . metoprolol tartrate  12.5 mg Oral BID  . morphine  15 mg Oral BID  . multivitamin with minerals  1 tablet Oral Daily  . nicotine  14 mg Transdermal Daily  . pantoprazole  40 mg Oral BID  . sodium chloride flush  3 mL Intravenous Q12H  . ticagrelor  90 mg Oral BID   Continuous Infusions: . sodium chloride     PRN Meds: sodium chloride, acetaminophen, nitroGLYCERIN, ondansetron (ZOFRAN) IV, oxyCODONE-acetaminophen **AND** oxyCODONE, sodium chloride flush   Vital Signs    Vitals:   01/11/17 0500 01/11/17 0600 01/11/17 0700 01/11/17 0747  BP: (!) 112/50 (!) 135/57 129/68   Pulse: 80 76 79   Resp: (!) 22 14 (!) 21   Temp:    98.2 F (36.8 C)  TempSrc:    Oral  SpO2: 97% 98% 97%   Weight:      Height:        Intake/Output Summary (Last 24 hours) at 01/11/17 3086 Last data filed at 01/11/17 0700  Gross per 24 hour  Intake              473 ml  Output              800 ml  Net             -327 ml   Filed Weights   01/09/17 1430  Weight: 145 lb 8.1 oz (66 kg)    Telemetry    Normal sinus rhythm - Personally Reviewed  ECG    Normal sinus rhythm with persistent anterolateral ST elevation - Personally Reviewed  Physical Exam   GEN: Tearful, No acute distress.   Neck:  6 cm JVD Cardiac: RRR, no murmurs, rubs, or gallops.  Respiratory: Clear to auscultation bilaterally. GI: Soft, nontender, non-distended  MS: No edema; No deformity. Neuro:  Nonfocal  Psych: Normal affect   Labs    Chemistry Recent Labs Lab 01/09/17 2350 01/10/17 0452 01/11/17 0521  NA 132* 133* 134*  K 3.4* 3.4* 4.0  CL 103 103 103  CO2 22 24 23   GLUCOSE 88 94 102*  BUN 18 17 14     CREATININE 1.09* 1.10* 1.03*  CALCIUM 7.8* 7.9* 8.1*  PROT 4.9*  --   --   ALBUMIN 2.4*  --   --   AST 57*  --   --   ALT 13*  --   --   ALKPHOS 48  --   --   BILITOT 0.7  --   --   GFRNONAA 50* 50* 54*  GFRAA 58* 58* >60  ANIONGAP 7 6 8      Hematology Recent Labs Lab 01/09/17 1520 01/10/17 0801 01/10/17 1700 01/11/17 0521  WBC 12.6* 9.5  --  9.3  RBC 4.20 3.61*  --  3.63*  HGB 11.3* 9.7* 10.0* 9.9*  HCT 35.9* 31.0* 31.8* 31.3*  MCV 85.5 85.9  --  86.2  MCH 26.9 26.9  --  27.3  MCHC 31.5 31.3  --  31.6  RDW 14.9 15.2  --  15.0  PLT 312 274  --  273    Cardiac Enzymes Recent Labs Lab 01/09/17  2137 01/09/17 2350 01/10/17 0452  TROPONINI 22.44* 24.11* 19.75*   No results for input(s): TROPIPOC in the last 168 hours.   BNPNo results for input(s): BNP, PROBNP in the last 168 hours.   DDimer No results for input(s): DDIMER in the last 168 hours.   Radiology    No results found.  Cardiac Studies   Left heart cath results reviewed personally  Patient Profile     71 y.o. female admitted with acute anterior myocardial infarction, with diagonal occlusion status post attempted angioplasty, unsuccessful in crossing the stenosis which was 100% occluded in the setting of severe LV dysfunction  Assessment & Plan    1. Acute anterior myocardial infarction secondary to diagonal occlusion, probably with late presentation - she will continue with medical therapy as described previously. Troponin peak at 24 2. Hypotension - her blood pressure has improved. We will start/continue low-dose beta blocker therapy, and will plan to add afterload reduction as blood pressure allows 3. Dyslipidemia - continue statin therapy 4. Ongoing tobacco abuse - the patient states that she has stop smoking. Previously she smoked 2 packs a day 5. Anemia, normocytic - her hemoglobin appears to have stabilized at around 10. No obvious active bleeding at the present time. She will continue acid  suppression.  Signed, Cristopher Peru, MD  01/11/2017, 8:22 AM  Patient ID: Ashley Miles, female   DOB: [birth year], 71 y.o.   MRN: [id number]

## 2017-01-12 LAB — CBC
HEMATOCRIT: 33.3 % — AB (ref 36.0–46.0)
Hemoglobin: 10.6 g/dL — ABNORMAL LOW (ref 12.0–15.0)
MCH: 27.2 pg (ref 26.0–34.0)
MCHC: 31.8 g/dL (ref 30.0–36.0)
MCV: 85.6 fL (ref 78.0–100.0)
Platelets: 312 10*3/uL (ref 150–400)
RBC: 3.89 MIL/uL (ref 3.87–5.11)
RDW: 15.2 % (ref 11.5–15.5)
WBC: 10.6 10*3/uL — ABNORMAL HIGH (ref 4.0–10.5)

## 2017-01-12 MED ORDER — CHLORHEXIDINE GLUCONATE CLOTH 2 % EX PADS
6.0000 | MEDICATED_PAD | Freq: Every day | CUTANEOUS | Status: DC
  Administered 2017-01-12 – 2017-01-13 (×2): 6 via TOPICAL

## 2017-01-12 NOTE — Progress Notes (Signed)
Progress Note  Patient Name: Ashley Miles Date of Encounter: 01/12/2017  Primary Cardiologist: Agustin Cree  Subjective   Denies chest pain or shortness of breath. Wants to go home  Inpatient Medications    Scheduled Meds: . aspirin EC  81 mg Oral Daily  . atorvastatin  80 mg Oral q1800  . Chlorhexidine Gluconate Cloth  6 each Topical Q0600  . metoprolol tartrate  12.5 mg Oral BID  . morphine  15 mg Oral BID  . multivitamin with minerals  1 tablet Oral Daily  . nicotine  14 mg Transdermal Daily  . pantoprazole  40 mg Oral BID  . sodium chloride flush  3 mL Intravenous Q12H  . ticagrelor  90 mg Oral BID   Continuous Infusions: . sodium chloride     PRN Meds: sodium chloride, acetaminophen, nitroGLYCERIN, ondansetron (ZOFRAN) IV, oxyCODONE-acetaminophen **AND** oxyCODONE, sodium chloride flush   Vital Signs    Vitals:   01/12/17 0500 01/12/17 0600 01/12/17 0700 01/12/17 0806  BP: (!) 109/46  (!) 119/53   Pulse: 72 70 72   Resp: 13 (!) 9 20   Temp:    97.7 F (36.5 C)  TempSrc:    Oral  SpO2: 96% 96% 97%   Weight:      Height:        Intake/Output Summary (Last 24 hours) at 01/12/17 0912 Last data filed at 01/12/17 2355  Gross per 24 hour  Intake              850 ml  Output              650 ml  Net              200 ml   Filed Weights   01/09/17 1430  Weight: 145 lb 8.1 oz (66 kg)    Telemetry    Normal sinus rhythm - Personally Reviewed  ECG    Normal sinus rhythm with residual ST elevation - Personally Reviewed  Physical Exam   GEN: No acute distress.   Neck: 6 cm JVD Cardiac: RRR, no murmurs, rubs, or gallops.  Respiratory: Clear to auscultation bilaterally. GI: Soft, nontender, non-distended  MS: No edema; No deformity. Neuro:  Nonfocal  Psych: Normal affect   Labs    Chemistry Recent Labs Lab 01/09/17 2350 01/10/17 0452 01/11/17 0521  NA 132* 133* 134*  K 3.4* 3.4* 4.0  CL 103 103 103  CO2 22 24 23   GLUCOSE 88 94 102*    BUN 18 17 14   CREATININE 1.09* 1.10* 1.03*  CALCIUM 7.8* 7.9* 8.1*  PROT 4.9*  --   --   ALBUMIN 2.4*  --   --   AST 57*  --   --   ALT 13*  --   --   ALKPHOS 48  --   --   BILITOT 0.7  --   --   GFRNONAA 50* 50* 54*  GFRAA 58* 58* >60  ANIONGAP 7 6 8      Hematology Recent Labs Lab 01/09/17 1520 01/10/17 0801 01/10/17 1700 01/11/17 0521  WBC 12.6* 9.5  --  9.3  RBC 4.20 3.61*  --  3.63*  HGB 11.3* 9.7* 10.0* 9.9*  HCT 35.9* 31.0* 31.8* 31.3*  MCV 85.5 85.9  --  86.2  MCH 26.9 26.9  --  27.3  MCHC 31.5 31.3  --  31.6  RDW 14.9 15.2  --  15.0  PLT 312 274  --  273  Cardiac Enzymes Recent Labs Lab 01/09/17 2137 01/09/17 2350 01/10/17 0452  TROPONINI 22.44* 24.11* 19.75*   No results for input(s): TROPIPOC in the last 168 hours.   BNPNo results for input(s): BNP, PROBNP in the last 168 hours.   DDimer No results for input(s): DDIMER in the last 168 hours.   Radiology    No results found.  Cardiac Studies  Moderate left ventricular dysfunction   Patient Profile     71 y.o. female admitted with an acute MI, status post attempted PCI, now chest pain-free.  Assessment & Plan    1. Acute MI - the patient is hemodynamically stable and pain-free. We'll plan to transfer the patient to telemetry with the expectation that she be discharged home tomorrow 2. Ongoing tobacco abuse - I strongly encouraged the patient to stop smoking. 3. Dyslipidemia - she will be discharged on aggressive statin therapy 4. Anemia, normocytic - her hemoglobin remained stable. No evidence of bleeding.  Signed, Cristopher Peru, MD  01/12/2017, 9:12 AM  Patient ID: Ashley Miles, female   DOB: 1946-04-16, 71 y.o.   MRN: [id number]

## 2017-01-12 NOTE — Progress Notes (Signed)
Report called at this time to RN on 2W

## 2017-01-12 NOTE — Progress Notes (Signed)
RN called to patients room this AM. Pt tearful stating that she found a gnat in her french toast. RN saw [employer] around food. Pt is upset about this. Rn offered to get patient a new meal or whatever she needed to feel better. Patient declined stating that she did not even want lunch to be delivered today. RN will continue to monitor.

## 2017-01-13 ENCOUNTER — Telehealth: Payer: Self-pay | Admitting: Cardiology

## 2017-01-13 DIAGNOSIS — I2510 Atherosclerotic heart disease of native coronary artery without angina pectoris: Secondary | ICD-10-CM

## 2017-01-13 DIAGNOSIS — E785 Hyperlipidemia, unspecified: Secondary | ICD-10-CM

## 2017-01-13 DIAGNOSIS — I5021 Acute systolic (congestive) heart failure: Secondary | ICD-10-CM

## 2017-01-13 LAB — CBC
HEMATOCRIT: 32 % — AB (ref 36.0–46.0)
Hemoglobin: 10 g/dL — ABNORMAL LOW (ref 12.0–15.0)
MCH: 27.1 pg (ref 26.0–34.0)
MCHC: 31.3 g/dL (ref 30.0–36.0)
MCV: 86.7 fL (ref 78.0–100.0)
Platelets: 324 10*3/uL (ref 150–400)
RBC: 3.69 MIL/uL — ABNORMAL LOW (ref 3.87–5.11)
RDW: 15.5 % (ref 11.5–15.5)
WBC: 8.6 10*3/uL (ref 4.0–10.5)

## 2017-01-13 MED ORDER — METOPROLOL TARTRATE 25 MG PO TABS: 12.5000 mg | ORAL_TABLET | Freq: Two times a day (BID) | ORAL | 6 refills | Status: DC

## 2017-01-13 MED ORDER — NITROGLYCERIN 0.4 MG SL SUBL: 0.4000 mg | SUBLINGUAL_TABLET | SUBLINGUAL | 12 refills | Status: DC | PRN

## 2017-01-13 MED ORDER — ASPIRIN 81 MG PO TBEC: 81.0000 mg | DELAYED_RELEASE_TABLET | Freq: Every day | ORAL | 11 refills | Status: DC

## 2017-01-13 MED ORDER — HEPARIN SOD (PORK) LOCK FLUSH 100 UNIT/ML IV SOLN
500.0000 [IU] | INTRAVENOUS | Status: AC | PRN
  Administered 2017-01-13: 500 [IU]

## 2017-01-13 MED ORDER — ATORVASTATIN CALCIUM 80 MG PO TABS: 80.0000 mg | ORAL_TABLET | Freq: Every day | ORAL | 6 refills | Status: DC

## 2017-01-13 MED ORDER — TICAGRELOR 90 MG PO TABS: 90.0000 mg | ORAL_TABLET | Freq: Two times a day (BID) | ORAL | 11 refills | Status: DC

## 2017-01-13 NOTE — Telephone Encounter (Signed)
New message       TCM appt on 01-20-17 with Ashley Miles per Cephus Shelling

## 2017-01-13 NOTE — Discharge Summary (Signed)
Discharge Summary    Patient ID: Ashley Miles,  MRN: [id number], DOB/AGE: 71-Aug-1947 71 y.o.  Admit date: 01/09/2017 Discharge date: 01/13/2017  Primary Care Provider: Ocie Doyne Primary Cardiologist: Dr. Agustin Cree   Discharge Diagnoses    Active Problems:   NSTEMI (non-ST elevated myocardial infarction) [facility])   Cardiogenic shock ([place])   Acute systolic heart failure (HCC)   Hypotension   HLD   COPD/tobacco abuse  Hypokalemia   Allergies No Known Allergies  Diagnostic Studies/Procedures    Cardiac Catheterization 01/09/17   Ost RCA lesion, 80 %stenosed.  Prox RCA lesion, 70 %stenosed.  Ramus lesion, 60 %stenosed.  Ost 2nd Diag to 2nd Diag lesion, 100 %stenosed.  Mid LAD lesion, 30 %stenosed.  1. Cardiomyopathy in setting of elevated troponin. The diagonal is occluded. This may have occurred in the last 36 hours. This could explain the elevated troponin. The small non-dominant RCA has severe disease. The degree of LV systolic dysfunction is out of proportion to the degree of CAD. 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  2. Unsuccessful attempt at PCI of the occluded Diagonal branch 3. Cardiogenic shock  Recommendations: I will admit her to the ICU. At this time, I do not think that she needs a hemodynamic support device. Her LV filling pressure is low. Will attempt gentle hydration. No good options for PCI. Medical management of CAD and cardiomyopathy. I have reviewed her films with the rounding team and the IC team. I have suggested that she consider stopping smoking given her end stage lung disease, h/o lung cancer and now cardiomyopathy. Consider palliative care consult to begin planning for end of life issues  Echo 01/10/17 Study Conclusions  - Left ventricle: Septal and apical akinesis. Distal and mid   anterior and inferior hypokinesis Normal basal function The   cavity size was mildly dilated. Wall thickness was  increased in a   pattern of mild LVH. Systolic function was moderately to severely   reduced. The estimated ejection fraction was in the range of 30%   to 35%. - Atrial septum: No defect or patent foramen ovale was identified. - Pericardium, extracardiac: A trivial pericardial effusion was   identified.   History of Present Illness     71 yo female with PMH of COPD, GERD, anemia, small cell lung Ca s/p chemo/radiation, chronic pain and tobacco use who presented to [facility] with nausea, abd pain and vomiting.   Reports she developed a sudden onset of epigastric pain yesterday with nausea. She vomited twice. Presented to the ED at [facility]. Troponin peaked at 23. She was started on IV heparin. Echo there showed newly reduced EF of 30-35% with diffuse akinesis. EKG today showed SR with deep TWI in anterolateral leads. She was transferred to [facility] for cardiac cath after being seen by Dr. Agustin Cree.  Hospital Course     Consultants: None  1.  NSTEMI/subacute anterior MI/CAD/Cardiogenic shock:   - Pt admitted on tx from [facility] on 6/7 with a h/o n, v, and anterior ST/T changes on ECG.  Cath revealed an occluded diag and moderate dzs in a nondominant RCA.  Attempt was made @ PCI of the diag but was unsuccessful, thus she will be medically managed.  She was hypotensive but has not required vasopressors.   No chest pain.  Troponin peaked @ 24.11 and now trending down.  Cont asa, brilinta (consider switch to plavix in setting of Med rx only), and high potency  statin therapy.  Cardiac rehab.  Smoking cessation.  2.  Hypotension:  In setting ofardiogenic shock.  Able to tolerate low dose BB.   3.  HL: 01/11/2017: Cholesterol 164; HDL 33; LDL Cholesterol 104; Triglycerides 133; VLDL 27  - LFT and Lipid in 6 weeks.   4.  Normocytic anemia:  In setting of PUD with peptic ulcers dx in 10/2016.  Cont BID PPI.   5.  Tob Abuse/COPD:  On patch during admission. Smoking cessation advised.  6.   Hypokalemia: Resolved with supplementation.   The patient has been seen by Dr. Debara Pickett  today and deemed ready for discharge home. All follow-up appointments have been scheduled. Discharge medications are listed below.    Discharge Vitals Blood pressure (!) 99/59, pulse 78, temperature 98.4 F (36.9 C), temperature source Oral, resp. rate 20, height 5\' 6"  (1.676 m), weight 144 lb 6.4 oz (65.5 kg), SpO2 99 %.  Filed Weights   01/09/17 1430 01/12/17 1054 01/13/17 0359  Weight: 145 lb 8.1 oz (66 kg) 147 lb 4.3 oz (66.8 kg) 144 lb 6.4 oz (65.5 kg)    Labs & Radiologic Studies     CBC  Recent Labs  01/12/17 1117 01/13/17 0154  WBC 10.6* 8.6  HGB 10.6* 10.0*  HCT 33.3* 32.0*  MCV 85.6 86.7  PLT 312 169   Basic Metabolic Panel  Recent Labs  01/11/17 0521  NA 134*  K 4.0  CL 103  CO2 23  GLUCOSE 102*  BUN 14  CREATININE 1.03*  CALCIUM 8.1*  Fasting Lipid Panel  Recent Labs  01/11/17 0521  CHOL 164  HDL 33*  LDLCALC 104*  TRIG 133  CHOLHDL 5.0     Disposition   Pt is being discharged home today in good condition.  Follow-up Plans & Appointments    Follow-up Information    Isaiah Serge, NP. Go on 01/20/2017.   Specialties:  Cardiology, Radiology Why:  @2 :30 post hospital follow up Contact information: [place] [id number] [phone]        [facility]. Go on 02/24/2017.   Specialty:  Cardiology Why:  @9 :40 am for pospital follow up with Dr.  Napoleon Form information: [address] [place] Follow up.   Why:  rollator arranged- to be delivered to room prior to discharge Contact information: [address] [place] [address] [phone]          Discharge Instructions    Amb Referral to Cardiac Rehabilitation    Complete by:  As directed    Diagnosis:  NSTEMI   Diet - low sodium heart  healthy    Complete by:  As directed    Discharge instructions    Complete by:  As directed    NO HEAVY LIFTING (>10lbs) X 2 WEEKS. NO SEXUAL ACTIVITY X 2 WEEKS. NO DRIVING X 1 WEEK. NO SOAKING BATHS, HOT TUBS, POOLS, ETC., X 7 DAYS.   Increase activity slowly    Complete by:  As directed       Discharge Medications   Current Discharge Medication List    START taking these medications   Details  aspirin EC 81 MG EC tablet Take 1 tablet (81 mg total) by mouth daily. Qty: 30 tablet, Refills: 11    atorvastatin (LIPITOR) 80 MG tablet Take 1 tablet (80 mg total) by  mouth daily at 6 PM. Qty: 30 tablet, Refills: 6    metoprolol tartrate (LOPRESSOR) 25 MG tablet Take 0.5 tablets (12.5 mg total) by mouth 2 (two) times daily. Qty: 30 tablet, Refills: 6    nitroGLYCERIN (NITROSTAT) 0.4 MG SL tablet Place 1 tablet (0.4 mg total) under the tongue every 5 (five) minutes x 3 doses as needed for chest pain. Qty: 25 tablet, Refills: 12    ticagrelor (BRILINTA) 90 MG TABS tablet Take 1 tablet (90 mg total) by mouth 2 (two) times daily. Qty: 60 tablet, Refills: 11      CONTINUE these medications which have NOT CHANGED   Details  morphine (MS CONTIN) 15 MG 12 hr tablet Take 15 mg by mouth 2 (two) times daily.    MOVANTIK 25 MG TABS tablet Take 25 mg by mouth daily.    oxyCODONE-acetaminophen (PERCOCET) 10-325 MG tablet Take 1 tablet by mouth every 6 (six) hours as needed for pain.    pantoprazole (PROTONIX) 40 MG tablet Take 40 mg by mouth 2 (two) times daily.    prochlorperazine (COMPAZINE) 10 MG tablet Take 10 mg by mouth 3 (three) times daily as needed for nausea/vomiting.         Aspirin prescribed at discharge?  Yes High Intensity Statin Prescribed? (Lipitor 40-80mg  or Crestor 20-40mg ): Yes Beta Blocker Prescribed? Yes For EF 45% or less, Was ACEI/ARB Prescribed? Unable to add due to soft BP ADP Receptor Inhibitor Prescribed? (i.e. Plavix etc.-Includes Medically Managed  Patients): Yes For EF <40%, Aldosterone Inhibitor Prescribed? Unable to add due to soft BP Was EF assessed during THIS hospitalization? Yes Was Cardiac Rehab II ordered? (Included Medically managed Patients): Yes   Outstanding Labs/Studies   LFT and lipid panel in 6 weeks  Duration of Discharge Encounter   Greater than 30 minutes including physician time.  Signed, Latravia Southgate PA-C 01/13/2017, 1:41 PM

## 2017-01-13 NOTE — Progress Notes (Signed)
CARDIAC REHAB PHASE I   PRE:  Rate/Rhythm: 81 SR  BP:  Sitting: 99/62        SaO2: 99 RA  MODE:  Ambulation: 350 ft   POST:  Rate/Rhythm: 102 ST  BP:  Sitting: 100/50         SaO2: 99 RA  Pt in bed, eager to go home, agreeable to walk.  Pt incontinent of stool, assisted in personal hygiene prior to ambulation. Pt ambulated 350 ft on RA, rolling walker, assist x1, mildly unsteady gait, tolerated fairly well. Pt c/o DOE, fatigue with distance and back pain, declined rest stop. Pt lives alone, fairly sedentary, does not have a walker at home. Pt would benefit from rollator for home use, RN aware. Pt to recliner after walk, feet elevated, call bell within reach. Will follow.   [id number] Lenna Sciara, RN, BSN 01/13/2017 8:39 AM

## 2017-01-13 NOTE — Progress Notes (Signed)
Per insurance check for Brilinta-  # 1.  MEDICARE EFF-DATE 05/06/2011       [place] [id number]      CO-PAY- $ 3.70 FOR EACH RX

## 2017-01-13 NOTE — Care Management Note (Signed)
Case Management Note Marvetta Gibbons RN, BSN Unit 2W-Case Manager-- [place] coverage [phone]  Patient Details  Name: Ashley Miles MRN: [id number] Date of Birth: 1945/10/02  Subjective/Objective:  Nstemi- admitted on transfer from [facility] on 6/7 with n, v, abd pain w/ subacute anterior MI  cath w/ occluded diag  unsuccessful PCI  med rx.                  Action/Plan: PTA pt lived at home- per MD note- plan for [facility] consult- CM to follow  Expected Discharge Date:  01/13/17               Expected Discharge Plan:  Home/Self Care  In-House Referral:     Discharge planning Services  CM Consult, Medication Assistance  Post Acute Care Choice:  Durable Medical Equipment Choice offered to:  Patient  DME Arranged:  Walker rolling with seat DME Agency:  [place]:  NA [place] Agency:  NA  Status of Service:  Completed, signed off  If discussed at [place] of Stay Meetings, dates discussed:    Discharge Disposition: home/self care   Additional Comments:  01/13/17- 1130 - Steffanie Dunn Geovanny Sartin rN, CM- pt for d/c home today- has been started on Brilinta- spoke with pt at bedside- 30 day free card given to pt to use on discharge- per pt she uses [facility]- call made to pharmacy- however they do not have drug in stock- [facility] [place]- the one on [address]- has drug in stock to fill today- pt informed of this. Pt also requested rollator for home- order has been placed- karen with [place] notified- DME to be delivered to room prior to discharge.  Per insurance check on Brilinta- # 1.  MEDICARE EFF-DATE 05/06/2011       [place]FF[place] [id number]      CO-PAY- $ 3.70 FOR [address]   Marvetta Gibbons [place], [place] 01/13/2017, 11:50 AM

## 2017-01-13 NOTE — Progress Notes (Signed)
DAILY PROGRESS NOTE   Patient Name: Ashley Miles Date of Encounter: 01/13/2017  Hospital Problem List   Active Problems:   NSTEMI (non-ST elevated myocardial infarction) [facility])   Cardiogenic shock [facility])    Chief Complaint   "Wants to go home"  Subjective   Ambulated with cardiac rehab today. Some stool incontinence noted today. Found to have new cardiomyopathy, mixed picture with underlying CAD but PCI attempted and not successful. No chest pain complaints. On aspirin and Brillinta for NSTEMI - troponin up to 24.11. Also metoprolol and atorvastatin. BP will not allow addition of ACE-I/ARB or Entresto at this time.  Objective   Vitals:   01/12/17 1054 01/12/17 2004 01/12/17 2110 01/13/17 0359  BP: (!) 192/69 (!) 90/59 (!) 91/52 (!) 99/59  Pulse: 94 87 76 78  Resp: 20 18  20   Temp: 98.3 F (36.8 C) 98.6 F (37 C)  98.4 F (36.9 C)  TempSrc: Oral Oral  Oral  SpO2: 98% 100%  99%  Weight: 147 lb 4.3 oz (66.8 kg)   144 lb 6.4 oz (65.5 kg)  Height:        Intake/Output Summary (Last 24 hours) at 01/13/17 0957 Last data filed at 01/12/17 1000  Gross per 24 hour  Intake              240 ml  Output              200 ml  Net               40 ml   Filed Weights   01/09/17 1430 01/12/17 1054 01/13/17 0359  Weight: 145 lb 8.1 oz (66 kg) 147 lb 4.3 oz (66.8 kg) 144 lb 6.4 oz (65.5 kg)    Physical Exam   General appearance: alert and no distress Lungs: clear to auscultation bilaterally Heart: regular rate and rhythm Extremities: extremities normal, atraumatic, no cyanosis or edema Neurologic: Grossly normal  Inpatient Medications    Scheduled Meds: . aspirin EC  81 mg Oral Daily  . atorvastatin  80 mg Oral q1800  . Chlorhexidine Gluconate Cloth  6 each Topical Q0600  . metoprolol tartrate  12.5 mg Oral BID  . morphine  15 mg Oral BID  . multivitamin with minerals  1 tablet Oral Daily  . nicotine  14 mg Transdermal Daily  . pantoprazole  40 mg Oral BID  .  sodium chloride flush  3 mL Intravenous Q12H  . ticagrelor  90 mg Oral BID    Continuous Infusions: . sodium chloride      PRN Meds: sodium chloride, acetaminophen, nitroGLYCERIN, ondansetron (ZOFRAN) IV, oxyCODONE-acetaminophen **AND** oxyCODONE, sodium chloride flush   Labs   Results for orders placed or performed during the hospital encounter of 01/09/17 (from the past 48 hour(s))  CBC     Status: Abnormal   Collection Time: 01/12/17 11:17 AM  Result Value Ref Range   WBC 10.6 (H) 4.0 - 10.5 K/uL   RBC 3.89 3.87 - 5.11 MIL/uL   Hemoglobin 10.6 (L) 12.0 - 15.0 g/dL   HCT 33.3 (L) 36.0 - 46.0 %   MCV 85.6 78.0 - 100.0 fL   MCH 27.2 26.0 - 34.0 pg   MCHC 31.8 30.0 - 36.0 g/dL   RDW 15.2 11.5 - 15.5 %   Platelets 312 150 - 400 K/uL  CBC     Status: Abnormal   Collection Time: 01/13/17  1:54 AM  Result Value Ref Range   WBC 8.6  4.0 - 10.5 K/uL   RBC 3.69 (L) 3.87 - 5.11 MIL/uL   Hemoglobin 10.0 (L) 12.0 - 15.0 g/dL   HCT 32.0 (L) 36.0 - 46.0 %   MCV 86.7 78.0 - 100.0 fL   MCH 27.1 26.0 - 34.0 pg   MCHC 31.3 30.0 - 36.0 g/dL   RDW 15.5 11.5 - 15.5 %   Platelets 324 150 - 400 K/uL    ECG   None today  Telemetry   NSR - Personally Reviewed  Radiology    No results found.  Cardiac Studies   LV EF: 30% -   35%  ------------------------------------------------------------------- Indications:      Chest pain 786.51.  ------------------------------------------------------------------- History:   PMH:   Chronic obstructive pulmonary disease.  Risk factors:  Hypertension. Dyslipidemia.  ------------------------------------------------------------------- Study Conclusions  - Left ventricle: Septal and apical akinesis. Distal and mid   anterior and inferior hypokinesis Normal basal function The   cavity size was mildly dilated. Wall thickness was increased in a   pattern of mild LVH. Systolic function was moderately to severely   reduced. The estimated  ejection fraction was in the range of 30%   to 35%. - Atrial septum: No defect or patent foramen ovale was identified. - Pericardium, extracardiac: A trivial pericardial effusion was   identified.  Procedures   Left Heart Cath and Coronary Angiography  Conclusion     Ost RCA lesion, 80 %stenosed.  Prox RCA lesion, 70 %stenosed.  Ramus lesion, 60 %stenosed.  Ost 2nd Diag to 2nd Diag lesion, 100 %stenosed.  Mid LAD lesion, 30 %stenosed.   1. Cardiomyopathy in setting of elevated troponin. The diagonal is occluded. This may have occurred in the last 36 hours. This could explain the elevated troponin. The small non-dominant RCA has severe disease. The degree of LV systolic dysfunction is out of proportion to the degree of CAD. 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  2. Unsuccessful attempt at PCI of the occluded Diagonal branch 3. Cardiogenic shock  Recommendations: I will admit her to the ICU. At this time, I do not think that she needs a hemodynamic support device. Her LV filling pressure is low. Will attempt gentle hydration. No good options for PCI. Medical management of CAD and cardiomyopathy. I have reviewed her films with the rounding team and the IC team. I have suggested that she consider stopping smoking given her end stage lung disease, h/o lung cancer and now cardiomyopathy. Consider palliative care consult to begin planning for end of life issues    Assessment   1. Active Problems: 2.   NSTEMI (non-ST elevated myocardial infarction) ([place]) 3.   Cardiogenic shock ([place]) 4. CAD with unsuccessful PCI 5. Dyslipidemia - LDL-C goal <70 (current 104)  Plan   1. Stable and appears euvolemic. BP stable on low dose metoprolol. ASA and Brillinta for NSTEMI - if cost is an issue, may consider switching Brillinta to Plavix at some point. Will not d/c on lasix - appears euvolemic. Not a candidate for ACE/ARB/Entresto at this time d/t resolving  hypotension. Will provide order for rolling-walker at home. Contact case management for [place] co-pay card. Ok to d/c home today. Follow-up with Dr. Agustin Cree in [place] or [facility]  Time Spent Directly with Patient:  15 minutes  Length of Stay:  LOS: 4 days   Pixie Casino, MD, [place]  Attending Cardiologist  Direct Dial: [phone]  Fax: [phone]  Website:  [email or web address] 01/13/2017, 9:57 AM

## 2017-01-13 NOTE — Progress Notes (Signed)
Discussed with the patient and all questioned fully answered. She will call me if any problems arise.  IV removed. Portacath deaccessed by IV team. Telemetry removed, CCMD notified. Pt given paper prescriptions, rollerator, and brilinta card. Discharge instructions gone over with pt and her daughter.   Fritz Pickerel, RN

## 2017-01-14 NOTE — Telephone Encounter (Signed)
Called pt. RN was unable to leave a message due to mailbox is not set up at this time.

## 2017-01-15 NOTE — Telephone Encounter (Signed)
Attempted to call Pt at # provided.  Call goes directly to VM and states VM is not set up.  Per review of Pt chart, 2 numbers listed for Geanie Kenning, emergency contact.  Call placed to [employer] [phone]).  Per Maudie Mercury, she is patient's daughter and emergency contact.  Will have Pt fill out DPR at upcoming TCM visit.  Per daughter, Pt has cell phone but does not get good service.  Pt states she will give all information to mother.  Patient contacted regarding discharge from [facility] 01/13/2017. Patient understands to follow up with provider Cecilie Kicks 01/21/2015 @ 2:30 pm @ [facility] office Patient understands discharge instructions? Did not speak with Pt Patient understands medications and regiment? Asked daughter if Pt was able to get her Brilinta.  Daughter states she did. Patient understands to bring all medications to this visit? Requested daughter have Pt bring all her medications to TCM visit Monday.  Daughter indicates understanding.  Daughter states Pt is doing ok.  No needs expressed during call.

## 2017-01-17 DIAGNOSIS — Z79899 Other long term (current) drug therapy: Secondary | ICD-10-CM | POA: Diagnosis not present

## 2017-01-17 DIAGNOSIS — D649 Anemia, unspecified: Secondary | ICD-10-CM | POA: Diagnosis not present

## 2017-01-17 DIAGNOSIS — C3490 Malignant neoplasm of unspecified part of unspecified bronchus or lung: Secondary | ICD-10-CM | POA: Diagnosis not present

## 2017-01-17 DIAGNOSIS — K922 Gastrointestinal hemorrhage, unspecified: Secondary | ICD-10-CM | POA: Diagnosis not present

## 2017-01-17 DIAGNOSIS — E538 Deficiency of other specified B group vitamins: Secondary | ICD-10-CM | POA: Diagnosis not present

## 2017-01-20 ENCOUNTER — Ambulatory Visit: Payer: Medicare Other | Admitting: Cardiology

## 2017-01-20 NOTE — Progress Notes (Deleted)
Cardiology Office Note   Date:  01/20/2017   ID:  Ashley Miles, DOB [birth year], MRN [id number]  PCP:  Ashley Miles., MD  Cardiologist:  Dr. Agustin Cree     No chief complaint on file.     History of Present Illness: Ashley Miles is a 71 y.o. female who presents for post hospitalization for NSTEMI 01/09/2017 to 01/13/2017.  Pt also with cardiogenic shock,.   Pt with hx of COPD, GERD with PUD in March, anemia, small cell lund Ca and chemo and radiation with chronic pain and tobacco use.  She presented to [facility] 01/09/17 with nausea, abd pain and vomiting.   She had been hospitalized in March with hematemesis and had elevated troponin thought to be demand ischemia and cards saw, no other studies but echo which was normal.     With recent admit she had epigastric pain and vomiting.  Her troponin pk was 23 and final pk ar [facility] 24.11and echo with EF 30-35% with diffuse akinesis.  EKG with SR with deep TWI in anterolateral leads.  She was transferred to [facility] and on arrival systolic BP was 70, though no pain or complaints.   Plan was for cardiac cath.  Found to have 80% ost. RCA lesion, prox RCA at 70%, ramus 60% stenosis, ost 2nd diag, to 2nd diag 100% stenosis, mLAD 30% stenosis.  Per Dr. Milton Ferguson "This may have occurred in the last 36 hours. This could explain the elevated troponin. The small non-dominant RCA has severe disease. The degree of LV systolic dysfunction is out of proportion to the degree of CAD. 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 "  She had unsuccessful attempt at PCI of the occluded Diag branch.   Also with cardiogenic shock.  Gentle hydration was started.   Echo post cath with EF 30-35%, trivial pericardial effusion noted.  She progressed nd BB was added at low dose but no ACE or ARB due to low BP  She is on high dose lipitor  Asa and brilinta.  Today***    Past Medical History:  Diagnosis Date  . COPD (chronic  obstructive pulmonary disease) ([place])   . GERD (gastroesophageal reflux disease)   . Hyperlipidemia   . Hypertension   . Small cell lung cancer ([place])   . Tobacco use     Past Surgical History:  Procedure Laterality Date  . LEFT HEART CATH AND CORONARY ANGIOGRAPHY N/A 01/09/2017   Procedure: Left Heart Cath and Coronary Angiography;  Surgeon: Burnell Blanks, MD;  Location: [facility];  Service: Cardiovascular;  Laterality: N/A;     Current Outpatient Prescriptions  Medication Sig Dispense Refill  . aspirin EC 81 MG EC tablet Take 1 tablet (81 mg total) by mouth daily. 30 tablet 11  . atorvastatin (LIPITOR) 80 MG tablet Take 1 tablet (80 mg total) by mouth daily at 6 PM. 30 tablet 6  . metoprolol tartrate (LOPRESSOR) 25 MG tablet Take 0.5 tablets (12.5 mg total) by mouth 2 (two) times daily. 30 tablet 6  . morphine (MS CONTIN) 15 MG 12 hr tablet Take 15 mg by mouth 2 (two) times daily.    Marland Kitchen MOVANTIK 25 MG TABS tablet Take 25 mg by mouth daily.    . nitroGLYCERIN (NITROSTAT) 0.4 MG SL tablet Place 1 tablet (0.4 mg total) under the tongue every 5 (five) minutes x 3 doses as needed for chest pain. 25 tablet 12  . oxyCODONE-acetaminophen (PERCOCET) 10-325  MG tablet Take 1 tablet by mouth every 6 (six) hours as needed for pain.    . pantoprazole (PROTONIX) 40 MG tablet Take 40 mg by mouth 2 (two) times daily.    . prochlorperazine (COMPAZINE) 10 MG tablet Take 10 mg by mouth 3 (three) times daily as needed for nausea/vomiting.    . ticagrelor (BRILINTA) 90 MG TABS tablet Take 1 tablet (90 mg total) by mouth 2 (two) times daily. 60 tablet 11   No current facility-administered medications for this visit.     Allergies:   Patient has no known allergies.    Social History:  The patient  reports that she has been smoking Cigarettes.  She has never used smokeless tobacco.   Family History:  The patient's family history includes Hypertension in her father.    ROS:  General:no  colds or fevers, no weight changes Skin:no rashes or ulcers HEENT:no blurred vision, no congestion CV:see HPI PUL:see HPI GI:no diarrhea constipation or melena, no indigestion GU:no hematuria, no dysuria MS:no joint pain, no claudication Neuro:no syncope, no lightheadedness Endo:no diabetes, no thyroid disease Wt Readings from Last 3 Encounters:  01/13/17 144 lb 6.4 oz (65.5 kg)     PHYSICAL EXAM: VS:  There were no vitals taken for this visit. , BMI There is no height or weight on file to calculate BMI. General:Pleasant affect, NAD Skin:Warm and dry, brisk capillary refill HEENT:normocephalic, sclera clear, mucus membranes moist Neck:supple, no JVD, no bruits  Heart:S1S2 RRR without murmur, gallup, rub or click Lungs:clear without rales, rhonchi, or wheezes KDT:OIZT, non tender, + BS, do not palpate liver spleen or masses Ext:no lower ext edema, 2+ pedal pulses, 2+ radial pulses Neuro:alert and oriented, MAE, follows commands, + facial symmetry    EKG:  EKG is ordered today. The ekg ordered today demonstrates ***   Recent Labs: 01/09/2017: ALT 13 01/11/2017: BUN 14; Creatinine, Ser 1.03; Potassium 4.0; Sodium 134 01/13/2017: Hemoglobin 10.0; Platelets 324    Lipid Panel    Component Value Date/Time   CHOL 164 01/11/2017 0521   TRIG 133 01/11/2017 0521   HDL 33 (L) 01/11/2017 0521   CHOLHDL 5.0 01/11/2017 0521   VLDL 27 01/11/2017 0521   LDLCALC 104 (H) 01/11/2017 0521       Other studies Reviewed: Additional studies/ records that were reviewed today include: ***.   ASSESSMENT AND PLAN:  1.  ***   Current medicines are reviewed with the patient today.  The patient Has no concerns regarding medicines.  The following changes have been made:  See above Labs/ tests ordered today include:see above  Disposition:   FU:  see above  Signed, Cecilie Kicks, NP  01/20/2017 12:25 PM    [facility] [place], [place], [place], [place] Phone: [phone]; Fax: [phone]

## 2017-01-21 DIAGNOSIS — D649 Anemia, unspecified: Secondary | ICD-10-CM | POA: Diagnosis not present

## 2017-01-21 DIAGNOSIS — E538 Deficiency of other specified B group vitamins: Secondary | ICD-10-CM | POA: Diagnosis not present

## 2017-01-21 DIAGNOSIS — M549 Dorsalgia, unspecified: Secondary | ICD-10-CM | POA: Diagnosis not present

## 2017-01-21 DIAGNOSIS — E784 Other hyperlipidemia: Secondary | ICD-10-CM | POA: Diagnosis not present

## 2017-01-28 DIAGNOSIS — Z1389 Encounter for screening for other disorder: Secondary | ICD-10-CM | POA: Diagnosis not present

## 2017-01-28 DIAGNOSIS — C3490 Malignant neoplasm of unspecified part of unspecified bronchus or lung: Secondary | ICD-10-CM | POA: Diagnosis not present

## 2017-01-28 DIAGNOSIS — I2510 Atherosclerotic heart disease of native coronary artery without angina pectoris: Secondary | ICD-10-CM | POA: Diagnosis not present

## 2017-01-28 DIAGNOSIS — E784 Other hyperlipidemia: Secondary | ICD-10-CM | POA: Diagnosis not present

## 2017-02-03 ENCOUNTER — Ambulatory Visit (INDEPENDENT_AMBULATORY_CARE_PROVIDER_SITE_OTHER): Payer: Medicare Other | Admitting: Nurse Practitioner

## 2017-02-03 ENCOUNTER — Encounter: Payer: Self-pay | Admitting: Nurse Practitioner

## 2017-02-03 VITALS — BP 102/68 | HR 76 | Ht 66.0 in | Wt 140.1 lb

## 2017-02-03 DIAGNOSIS — I214 Non-ST elevation (NSTEMI) myocardial infarction: Secondary | ICD-10-CM | POA: Diagnosis not present

## 2017-02-03 NOTE — Patient Instructions (Addendum)
We will be checking the following labs today - BMET, LFTs & CBC   Medication Instructions:    Continue with your current medicines.     Testing/Procedures To Be Arranged:  N/A  Follow-Up:   See Dr. Agustin Cree in 2 to 3 weeks with fasting labs    Other Special Instructions:   Keep working on smoking cessation    If you need a refill on your cardiac medications before your next appointment, please call your pharmacy.   Call the [place] office at [phone] if you have any questions, problems or concerns.

## 2017-02-03 NOTE — Progress Notes (Signed)
CARDIOLOGY OFFICE NOTE  Date:  02/03/2017    Ashley Miles Date of Birth: 05-24-1946 Medical Record #[id number]  PCP:  Ocie Doyne., MD  Cardiologist:  Agustin Cree    Chief Complaint  Patient presents with  . Coronary Artery Disease    Post hospital visit - seen for Dr. Agustin Cree    History of Present Illness: Ashley Miles is a 71 y.o. female who presents today for a post hospital visit/TOC visit. She is out of the timeframe for TOC. Seen for Dr. Agustin Cree.   She has a PMH of COPD, GERD, anemia, small cell lung Ca s/p chemo/radiation, chronic pain and tobacco use who presented earlier this month to [facility] [place] with nausea, abd pain and vomiting.  Troponin peaked at 23. She was started on IV heparin. Echo there showed newly reduced EF of 30-35% with diffuse akinesis. EKG showed SR with deep TWI in anterolateral leads. She was transferred to [facility] for cardiac cath after being seen by Dr. Agustin Cree.  Cath revealed an occluded diag and moderate dzs in a nondominant RCA. Attempt was made @ PCI of the diag but was unsuccessful, thus she will be medically managed. She was hypotensive but did not require vasopressors. Troponin peaked @ 24.11. Placed on asa, brilinta (consider switch to plavix in setting of Med rx only), and high potency statin therapy. Smoking cessation encouraged. She was able to tolerate only low dose beta blocker.   Comes in today. Here with her daughter. Home about 3 weeks. No real problems. No chest pain. Breathing is stable. Smoking still - trying to quit. Sounds like she is tapering down. Says her lung cancer is "fine". Chronic back pain - on chronic narcotics. No chest pain. Taking her medicines. Not doing much in the way of activities but overall stable.   Past Medical History:  Diagnosis Date  . COPD (chronic obstructive pulmonary disease) ([place])   . GERD (gastroesophageal reflux disease)   . Hyperlipidemia   . Hypertension   . Small cell lung cancer  ([place])   . Tobacco use     Past Surgical History:  Procedure Laterality Date  . CESAREAN SECTION    . LEFT HEART CATH AND CORONARY ANGIOGRAPHY N/A 01/09/2017   Procedure: Left Heart Cath and Coronary Angiography;  Surgeon: Burnell Blanks, MD;  Location: [facility];  Service: Cardiovascular;  Laterality: N/A;  . LUNG CANCER SURGERY  2014     Medications: Current Meds  Medication Sig  . aspirin EC 81 MG EC tablet Take 1 tablet (81 mg total) by mouth daily.  Marland Kitchen atorvastatin (LIPITOR) 80 MG tablet Take 1 tablet (80 mg total) by mouth daily at 6 PM.  . buPROPion (WELLBUTRIN SR) 150 MG 12 hr tablet Take 150 mg by mouth 2 (two) times daily.  . Ferrous Sulfate (FEROSUL PO) Take 325 mg by mouth daily.  . metoprolol tartrate (LOPRESSOR) 25 MG tablet Take 0.5 tablets (12.5 mg total) by mouth 2 (two) times daily.  Marland Kitchen morphine (MS CONTIN) 15 MG 12 hr tablet Take 15 mg by mouth 2 (two) times daily.  Marland Kitchen MOVANTIK 25 MG TABS tablet Take 25 mg by mouth daily.  . nitroGLYCERIN (NITROSTAT) 0.4 MG SL tablet Place 1 tablet (0.4 mg total) under the tongue every 5 (five) minutes x 3 doses as needed for chest pain.  Marland Kitchen ondansetron (ZOFRAN) 4 MG tablet Take 4 mg by mouth every 8 (eight) hours as needed for nausea or vomiting.  Marland Kitchen  oxyCODONE-acetaminophen (PERCOCET) 10-325 MG tablet Take 1 tablet by mouth every 6 (six) hours as needed for pain.  . pantoprazole (PROTONIX) 40 MG tablet Take 40 mg by mouth 2 (two) times daily.  . polyethylene glycol (MIRALAX / GLYCOLAX) packet Take 17 g by mouth daily.  . prochlorperazine (COMPAZINE) 10 MG tablet Take 10 mg by mouth 3 (three) times daily as needed for nausea/vomiting.  . ticagrelor (BRILINTA) 90 MG TABS tablet Take 1 tablet (90 mg total) by mouth 2 (two) times daily.     Allergies: No Known Allergies  Social History: The patient  reports that she has been smoking Cigarettes.  She has never used smokeless tobacco.   Family History: The patient's  family history includes Blindness in her mother; Cancer in her father; Hypertension in her father.   Review of Systems: Please see the history of present illness.   Otherwise, the review of systems is positive for none.   All other systems are reviewed and negative.   Physical Exam: VS:  BP 102/68 (BP Location: Left Arm, Patient Position: Sitting, Cuff Size: Normal)   Pulse 76   Ht 5\' 6"  (1.676 m)   Wt 140 lb 1.9 oz (63.6 kg)   BMI 22.62 kg/m  .  BMI Body mass index is 22.62 kg/m.  Wt Readings from Last 3 Encounters:  02/03/17 140 lb 1.9 oz (63.6 kg)  01/13/17 144 lb 6.4 oz (65.5 kg)    General: Pleasant. Chronically ill. Quite pale. She is alert and in no acute distress.   HEENT: Normal.  Neck: Supple, no JVD, carotid bruits, or masses noted.  Cardiac: Regular rate and rhythm. No murmurs, rubs, or gallops. No edema.  Respiratory:  Lungs are coarse but with normal work of breathing.  GI: Soft and nontender.  MS: No deformity or atrophy. Gait and ROM intact.  Skin: Warm and dry. Color is normal.  Neuro:  Strength and sensation are intact and no gross focal deficits noted.  Psych: Alert, appropriate and with normal affect.   LABORATORY DATA:  EKG:  EKG is not ordered today.  Lab Results  Component Value Date   WBC 8.6 01/13/2017   HGB 10.0 (L) 01/13/2017   HCT 32.0 (L) 01/13/2017   PLT 324 01/13/2017   GLUCOSE 102 (H) 01/11/2017   CHOL 164 01/11/2017   TRIG 133 01/11/2017   HDL 33 (L) 01/11/2017   LDLCALC 104 (H) 01/11/2017   ALT 13 (L) 01/09/2017   AST 57 (H) 01/09/2017   NA 134 (L) 01/11/2017   K 4.0 01/11/2017   CL 103 01/11/2017   CREATININE 1.03 (H) 01/11/2017   BUN 14 01/11/2017   CO2 23 01/11/2017     BNP (last 3 results) No results for input(s): BNP in the last 8760 hours.  ProBNP (last 3 results) No results for input(s): PROBNP in the last 8760 hours.   Other Studies Reviewed Today:  Cardiac Catheterization6/7/18   Ost RCA lesion, 80  %stenosed.  Prox RCA lesion, 70 %stenosed.  [place]amus lesion, 60 %stenosed.  Ost 2nd Diag to 2nd Diag lesion, 100 %stenosed.  Mid LAD lesion, 30 %stenosed.  1. Cardiomyopathy in setting of elevated troponin. The diagonal is occluded. This may have occurred in the last 36 hours. This could explain the elevated troponin. The small non-dominant RCA has severe disease. The degree of LV systolic dysfunction is out of proportion to the degree of CAD. 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  2. Unsuccessful  attempt at PCI of the occluded Diagonal branch 3. Cardiogenic shock  Recommendations: I will admit her to the ICU. At this time, I do not think that she needs a hemodynamic support device. Her LV filling pressure is low. Will attempt gentle hydration. No good options for PCI. Medical management of CAD and cardiomyopathy. I have reviewed her films with the rounding team and the IC team. I have suggested that she consider stopping smoking given her end stage lung disease, h/o lung cancer and now cardiomyopathy. Consider palliative care consult to begin planning for end of life issues  Echo 01/10/17 Study Conclusions  - Left ventricle: Septal and apical akinesis. Distal and mid anterior and inferior hypokinesis Normal basal function The cavity size was mildly dilated. Wall thickness was increased in a pattern of mild LVH. Systolic function was moderately to severely reduced. The estimated ejection fraction was in the range of 30% to 35%. - Atrial septum: No defect or patent foramen ovale was identified. - Pericardium, extracardiac: A trivial pericardial effusion was identified.     Assessment/Plan:  1. NSTEMI/subacute anterior MI/CAD/Cardiogenic shock:  - Pt admitted on tx from [facility] on 6/7 with a h/o n, v, and anterior ST/T changes on ECG. Cath revealed an occluded diag and moderate dzs in a nondominant RCA. Attempt was made @  PCI of the diag but was unsuccessful, thus she will be medically managed. She was hypotensive but did not required vasopressors. No chest pain. Troponin peaked @ 24.11She is on asa, brilinta (consider switch to plavix in setting of Med rx only), and high potency statin therapy.  Smoking cessation encouraged greatly.  She is doing ok clinically. BP will not support additional medicine at this time. Lab today.   2. Chronic systolic HF - not able to place on ARB - weight stable. Symptoms stable. Lab today. Plan for repeat echo in 3 months to reassess LV function.   3. Hypotension: BP still soft.  4. HLD - on statin therapy - needs labs on return. LFTs were up - rechecking today.   5. Normocytic anemia: In setting of PUD with peptic ulcers dx in 10/2016. Cont BID PPI.   6. Tob Abuse/COPD: Smoking cessation advised.  Current medicines are reviewed with the patient today.  The patient does not have concerns regarding medicines other than what has been noted above.  The following changes have been made:  See above.  Labs/ tests ordered today include:    Orders Placed This Encounter  Procedures  . Basic metabolic panel  . CBC  . Hepatic function panel     Disposition:   FU with her primary cardiologist in 2 to 3 weeks with fasting labs.   Patient is agreeable to this plan and will call if any problems develop in the interim.   SignedTruitt Merle, NP  02/03/2017 11:51 AM  [facility] [address] [place], [place]  [address] Phone: [phone] Fax: [phone]

## 2017-02-04 LAB — BASIC METABOLIC PANEL
BUN/Creatinine Ratio: 10 — ABNORMAL LOW (ref 12–28)
BUN: 14 mg/dL (ref 8–27)
CO2: 21 mmol/L (ref 20–29)
Calcium: 9.2 mg/dL (ref 8.7–10.3)
Chloride: 103 mmol/L (ref 96–106)
Creatinine, Ser: 1.36 mg/dL — ABNORMAL HIGH (ref 0.57–1.00)
GFR calc Af Amer: 45 mL/min/{1.73_m2} — ABNORMAL LOW (ref >59)
GFR calc non Af Amer: 39 mL/min/{1.73_m2} — ABNORMAL LOW (ref >59)
Glucose: 89 mg/dL (ref 65–99)
Potassium: 4.8 mmol/L (ref 3.5–5.2)
Sodium: 144 mmol/L (ref 134–144)

## 2017-02-04 LAB — HEPATIC FUNCTION PANEL
ALT: 10 IU/L (ref 0–32)
AST: 17 IU/L (ref 0–40)
Albumin: 3.9 g/dL (ref 3.5–4.8)
Alkaline Phosphatase: 83 IU/L (ref 39–117)
Bilirubin Total: 0.2 mg/dL (ref 0.0–1.2)
Bilirubin, Direct: 0.07 mg/dL (ref 0.00–0.40)
Total Protein: 6.7 g/dL (ref 6.0–8.5)

## 2017-02-04 LAB — CBC
Hematocrit: 34.1 % (ref 34.0–46.6)
Hemoglobin: 10.7 g/dL — ABNORMAL LOW (ref 11.1–15.9)
MCH: 26.9 pg (ref 26.6–33.0)
MCHC: 31.4 g/dL — ABNORMAL LOW (ref 31.5–35.7)
MCV: 86 fL (ref 79–97)
Platelets: 406 10*3/uL — ABNORMAL HIGH (ref 150–379)
RBC: 3.98 x10E6/uL (ref 3.77–5.28)
RDW: 16.6 % — ABNORMAL HIGH (ref 12.3–15.4)
WBC: 8.2 10*3/uL (ref 3.4–10.8)

## 2017-02-17 DIAGNOSIS — K449 Diaphragmatic hernia without obstruction or gangrene: Secondary | ICD-10-CM | POA: Diagnosis not present

## 2017-02-17 DIAGNOSIS — I10 Essential (primary) hypertension: Secondary | ICD-10-CM | POA: Diagnosis not present

## 2017-02-17 DIAGNOSIS — K219 Gastro-esophageal reflux disease without esophagitis: Secondary | ICD-10-CM | POA: Diagnosis not present

## 2017-02-17 DIAGNOSIS — I252 Old myocardial infarction: Secondary | ICD-10-CM | POA: Diagnosis not present

## 2017-02-17 DIAGNOSIS — Z79899 Other long term (current) drug therapy: Secondary | ICD-10-CM | POA: Diagnosis not present

## 2017-02-17 DIAGNOSIS — J449 Chronic obstructive pulmonary disease, unspecified: Secondary | ICD-10-CM | POA: Diagnosis not present

## 2017-02-17 DIAGNOSIS — Z85118 Personal history of other malignant neoplasm of bronchus and lung: Secondary | ICD-10-CM | POA: Diagnosis not present

## 2017-02-17 DIAGNOSIS — K3189 Other diseases of stomach and duodenum: Secondary | ICD-10-CM | POA: Diagnosis not present

## 2017-02-17 DIAGNOSIS — K319 Disease of stomach and duodenum, unspecified: Secondary | ICD-10-CM | POA: Diagnosis not present

## 2017-02-17 DIAGNOSIS — K259 Gastric ulcer, unspecified as acute or chronic, without hemorrhage or perforation: Secondary | ICD-10-CM | POA: Diagnosis not present

## 2017-02-17 DIAGNOSIS — Z09 Encounter for follow-up examination after completed treatment for conditions other than malignant neoplasm: Secondary | ICD-10-CM | POA: Diagnosis not present

## 2017-02-17 HISTORY — PX: ESOPHAGOGASTRODUODENOSCOPY: SHX1529

## 2017-02-18 DIAGNOSIS — E538 Deficiency of other specified B group vitamins: Secondary | ICD-10-CM | POA: Diagnosis not present

## 2017-02-18 DIAGNOSIS — E784 Other hyperlipidemia: Secondary | ICD-10-CM | POA: Diagnosis not present

## 2017-02-18 DIAGNOSIS — C3490 Malignant neoplasm of unspecified part of unspecified bronchus or lung: Secondary | ICD-10-CM | POA: Diagnosis not present

## 2017-02-18 DIAGNOSIS — I2510 Atherosclerotic heart disease of native coronary artery without angina pectoris: Secondary | ICD-10-CM | POA: Diagnosis not present

## 2017-02-18 DIAGNOSIS — D649 Anemia, unspecified: Secondary | ICD-10-CM | POA: Diagnosis not present

## 2017-02-18 DIAGNOSIS — K5900 Constipation, unspecified: Secondary | ICD-10-CM | POA: Diagnosis not present

## 2017-02-18 DIAGNOSIS — R110 Nausea: Secondary | ICD-10-CM | POA: Diagnosis not present

## 2017-02-19 ENCOUNTER — Encounter: Payer: Self-pay | Admitting: *Deleted

## 2017-02-19 DIAGNOSIS — M4680 Other specified inflammatory spondylopathies, site unspecified: Secondary | ICD-10-CM | POA: Diagnosis not present

## 2017-02-19 DIAGNOSIS — Z9221 Personal history of antineoplastic chemotherapy: Secondary | ICD-10-CM | POA: Diagnosis not present

## 2017-02-19 DIAGNOSIS — Z85118 Personal history of other malignant neoplasm of bronchus and lung: Secondary | ICD-10-CM | POA: Diagnosis not present

## 2017-02-19 DIAGNOSIS — Z923 Personal history of irradiation: Secondary | ICD-10-CM | POA: Diagnosis not present

## 2017-02-19 DIAGNOSIS — E876 Hypokalemia: Secondary | ICD-10-CM | POA: Diagnosis not present

## 2017-02-19 DIAGNOSIS — N189 Chronic kidney disease, unspecified: Secondary | ICD-10-CM | POA: Diagnosis not present

## 2017-02-19 DIAGNOSIS — D649 Anemia, unspecified: Secondary | ICD-10-CM | POA: Diagnosis not present

## 2017-02-19 DIAGNOSIS — G8929 Other chronic pain: Secondary | ICD-10-CM | POA: Diagnosis not present

## 2017-02-20 DIAGNOSIS — Z1231 Encounter for screening mammogram for malignant neoplasm of breast: Secondary | ICD-10-CM | POA: Diagnosis not present

## 2017-02-22 DIAGNOSIS — R197 Diarrhea, unspecified: Secondary | ICD-10-CM | POA: Diagnosis not present

## 2017-02-22 DIAGNOSIS — G8929 Other chronic pain: Secondary | ICD-10-CM | POA: Diagnosis not present

## 2017-02-22 DIAGNOSIS — M549 Dorsalgia, unspecified: Secondary | ICD-10-CM | POA: Diagnosis not present

## 2017-02-22 DIAGNOSIS — I509 Heart failure, unspecified: Secondary | ICD-10-CM | POA: Diagnosis not present

## 2017-02-22 DIAGNOSIS — R110 Nausea: Secondary | ICD-10-CM | POA: Diagnosis not present

## 2017-02-22 DIAGNOSIS — Z7982 Long term (current) use of aspirin: Secondary | ICD-10-CM | POA: Diagnosis not present

## 2017-02-22 DIAGNOSIS — Z79891 Long term (current) use of opiate analgesic: Secondary | ICD-10-CM | POA: Diagnosis not present

## 2017-02-22 DIAGNOSIS — I252 Old myocardial infarction: Secondary | ICD-10-CM | POA: Diagnosis not present

## 2017-02-22 DIAGNOSIS — Z79899 Other long term (current) drug therapy: Secondary | ICD-10-CM | POA: Diagnosis not present

## 2017-02-22 DIAGNOSIS — E860 Dehydration: Secondary | ICD-10-CM | POA: Diagnosis not present

## 2017-02-22 DIAGNOSIS — J449 Chronic obstructive pulmonary disease, unspecified: Secondary | ICD-10-CM | POA: Diagnosis not present

## 2017-02-22 DIAGNOSIS — I110 Hypertensive heart disease with heart failure: Secondary | ICD-10-CM | POA: Diagnosis not present

## 2017-02-23 DIAGNOSIS — I2510 Atherosclerotic heart disease of native coronary artery without angina pectoris: Secondary | ICD-10-CM | POA: Diagnosis not present

## 2017-02-23 DIAGNOSIS — R0600 Dyspnea, unspecified: Secondary | ICD-10-CM | POA: Diagnosis not present

## 2017-02-23 DIAGNOSIS — R112 Nausea with vomiting, unspecified: Secondary | ICD-10-CM | POA: Diagnosis not present

## 2017-02-23 DIAGNOSIS — I509 Heart failure, unspecified: Secondary | ICD-10-CM | POA: Diagnosis not present

## 2017-02-23 DIAGNOSIS — I110 Hypertensive heart disease with heart failure: Secondary | ICD-10-CM | POA: Diagnosis not present

## 2017-02-23 DIAGNOSIS — R1110 Vomiting, unspecified: Secondary | ICD-10-CM | POA: Diagnosis not present

## 2017-02-23 DIAGNOSIS — K529 Noninfective gastroenteritis and colitis, unspecified: Secondary | ICD-10-CM | POA: Diagnosis not present

## 2017-02-23 DIAGNOSIS — Z7982 Long term (current) use of aspirin: Secondary | ICD-10-CM | POA: Diagnosis not present

## 2017-02-23 DIAGNOSIS — Z79899 Other long term (current) drug therapy: Secondary | ICD-10-CM | POA: Diagnosis not present

## 2017-02-23 DIAGNOSIS — Z85118 Personal history of other malignant neoplasm of bronchus and lung: Secondary | ICD-10-CM | POA: Diagnosis not present

## 2017-02-23 DIAGNOSIS — J449 Chronic obstructive pulmonary disease, unspecified: Secondary | ICD-10-CM | POA: Diagnosis not present

## 2017-02-23 DIAGNOSIS — I252 Old myocardial infarction: Secondary | ICD-10-CM | POA: Diagnosis not present

## 2017-02-24 ENCOUNTER — Ambulatory Visit: Payer: Medicare Other | Admitting: Cardiology

## 2017-02-24 ENCOUNTER — Telehealth: Payer: Self-pay | Admitting: Cardiology

## 2017-02-24 NOTE — Telephone Encounter (Signed)
Attempted to call and no answer. Pt should be taking Brilinta 90 mg twice daily.

## 2017-02-24 NOTE — Telephone Encounter (Signed)
[facility] needs verficcation on [place],, please call them

## 2017-02-25 DIAGNOSIS — R140 Abdominal distension (gaseous): Secondary | ICD-10-CM | POA: Diagnosis not present

## 2017-02-25 DIAGNOSIS — M549 Dorsalgia, unspecified: Secondary | ICD-10-CM | POA: Diagnosis not present

## 2017-02-25 DIAGNOSIS — R748 Abnormal levels of other serum enzymes: Secondary | ICD-10-CM | POA: Diagnosis not present

## 2017-02-25 DIAGNOSIS — I5042 Chronic combined systolic (congestive) and diastolic (congestive) heart failure: Secondary | ICD-10-CM | POA: Diagnosis not present

## 2017-02-25 DIAGNOSIS — M1990 Unspecified osteoarthritis, unspecified site: Secondary | ICD-10-CM | POA: Diagnosis not present

## 2017-02-25 DIAGNOSIS — N179 Acute kidney failure, unspecified: Secondary | ICD-10-CM | POA: Diagnosis not present

## 2017-02-25 DIAGNOSIS — Z7982 Long term (current) use of aspirin: Secondary | ICD-10-CM | POA: Diagnosis not present

## 2017-02-25 DIAGNOSIS — I130 Hypertensive heart and chronic kidney disease with heart failure and stage 1 through stage 4 chronic kidney disease, or unspecified chronic kidney disease: Secondary | ICD-10-CM | POA: Diagnosis not present

## 2017-02-25 DIAGNOSIS — K219 Gastro-esophageal reflux disease without esophagitis: Secondary | ICD-10-CM | POA: Diagnosis not present

## 2017-02-25 DIAGNOSIS — K5909 Other constipation: Secondary | ICD-10-CM | POA: Diagnosis not present

## 2017-02-25 DIAGNOSIS — M4854XA Collapsed vertebra, not elsewhere classified, thoracic region, initial encounter for fracture: Secondary | ICD-10-CM | POA: Diagnosis not present

## 2017-02-25 DIAGNOSIS — E785 Hyperlipidemia, unspecified: Secondary | ICD-10-CM | POA: Diagnosis not present

## 2017-02-25 DIAGNOSIS — R079 Chest pain, unspecified: Secondary | ICD-10-CM | POA: Diagnosis not present

## 2017-02-25 DIAGNOSIS — Z79891 Long term (current) use of opiate analgesic: Secondary | ICD-10-CM | POA: Diagnosis not present

## 2017-02-25 DIAGNOSIS — J449 Chronic obstructive pulmonary disease, unspecified: Secondary | ICD-10-CM | POA: Diagnosis not present

## 2017-02-25 DIAGNOSIS — I214 Non-ST elevation (NSTEMI) myocardial infarction: Secondary | ICD-10-CM | POA: Diagnosis not present

## 2017-02-25 DIAGNOSIS — I2510 Atherosclerotic heart disease of native coronary artery without angina pectoris: Secondary | ICD-10-CM | POA: Diagnosis not present

## 2017-02-25 DIAGNOSIS — D649 Anemia, unspecified: Secondary | ICD-10-CM | POA: Diagnosis not present

## 2017-02-25 DIAGNOSIS — K253 Acute gastric ulcer without hemorrhage or perforation: Secondary | ICD-10-CM | POA: Diagnosis not present

## 2017-02-25 DIAGNOSIS — I255 Ischemic cardiomyopathy: Secondary | ICD-10-CM | POA: Diagnosis not present

## 2017-02-25 DIAGNOSIS — R9431 Abnormal electrocardiogram [ECG] [EKG]: Secondary | ICD-10-CM | POA: Diagnosis not present

## 2017-02-25 DIAGNOSIS — G8929 Other chronic pain: Secondary | ICD-10-CM | POA: Diagnosis not present

## 2017-02-25 DIAGNOSIS — I509 Heart failure, unspecified: Secondary | ICD-10-CM | POA: Diagnosis not present

## 2017-02-25 DIAGNOSIS — R110 Nausea: Secondary | ICD-10-CM | POA: Diagnosis not present

## 2017-02-25 DIAGNOSIS — I252 Old myocardial infarction: Secondary | ICD-10-CM | POA: Diagnosis not present

## 2017-02-25 DIAGNOSIS — N183 Chronic kidney disease, stage 3 (moderate): Secondary | ICD-10-CM | POA: Diagnosis not present

## 2017-02-25 DIAGNOSIS — Z79899 Other long term (current) drug therapy: Secondary | ICD-10-CM | POA: Diagnosis not present

## 2017-02-25 DIAGNOSIS — K279 Peptic ulcer, site unspecified, unspecified as acute or chronic, without hemorrhage or perforation: Secondary | ICD-10-CM | POA: Diagnosis not present

## 2017-02-26 DIAGNOSIS — R748 Abnormal levels of other serum enzymes: Secondary | ICD-10-CM | POA: Diagnosis not present

## 2017-02-26 DIAGNOSIS — I2510 Atherosclerotic heart disease of native coronary artery without angina pectoris: Secondary | ICD-10-CM | POA: Diagnosis not present

## 2017-02-26 DIAGNOSIS — R110 Nausea: Secondary | ICD-10-CM | POA: Diagnosis not present

## 2017-02-26 DIAGNOSIS — M1990 Unspecified osteoarthritis, unspecified site: Secondary | ICD-10-CM | POA: Diagnosis not present

## 2017-02-26 DIAGNOSIS — I509 Heart failure, unspecified: Secondary | ICD-10-CM | POA: Diagnosis not present

## 2017-02-26 DIAGNOSIS — N183 Chronic kidney disease, stage 3 (moderate): Secondary | ICD-10-CM | POA: Diagnosis not present

## 2017-02-26 DIAGNOSIS — R112 Nausea with vomiting, unspecified: Secondary | ICD-10-CM | POA: Diagnosis not present

## 2017-02-26 DIAGNOSIS — N179 Acute kidney failure, unspecified: Secondary | ICD-10-CM | POA: Diagnosis not present

## 2017-02-26 DIAGNOSIS — I214 Non-ST elevation (NSTEMI) myocardial infarction: Secondary | ICD-10-CM | POA: Diagnosis not present

## 2017-02-27 DIAGNOSIS — R112 Nausea with vomiting, unspecified: Secondary | ICD-10-CM | POA: Diagnosis not present

## 2017-02-27 DIAGNOSIS — I509 Heart failure, unspecified: Secondary | ICD-10-CM | POA: Diagnosis not present

## 2017-02-27 DIAGNOSIS — R110 Nausea: Secondary | ICD-10-CM | POA: Diagnosis not present

## 2017-02-27 DIAGNOSIS — R1314 Dysphagia, pharyngoesophageal phase: Secondary | ICD-10-CM | POA: Diagnosis not present

## 2017-02-27 DIAGNOSIS — N183 Chronic kidney disease, stage 3 (moderate): Secondary | ICD-10-CM | POA: Diagnosis not present

## 2017-02-27 DIAGNOSIS — R748 Abnormal levels of other serum enzymes: Secondary | ICD-10-CM | POA: Diagnosis not present

## 2017-02-27 DIAGNOSIS — I214 Non-ST elevation (NSTEMI) myocardial infarction: Secondary | ICD-10-CM | POA: Diagnosis not present

## 2017-02-27 DIAGNOSIS — N179 Acute kidney failure, unspecified: Secondary | ICD-10-CM | POA: Diagnosis not present

## 2017-02-27 DIAGNOSIS — M1990 Unspecified osteoarthritis, unspecified site: Secondary | ICD-10-CM | POA: Diagnosis not present

## 2017-02-27 DIAGNOSIS — I2510 Atherosclerotic heart disease of native coronary artery without angina pectoris: Secondary | ICD-10-CM | POA: Diagnosis not present

## 2017-02-28 DIAGNOSIS — R748 Abnormal levels of other serum enzymes: Secondary | ICD-10-CM | POA: Diagnosis not present

## 2017-02-28 DIAGNOSIS — R110 Nausea: Secondary | ICD-10-CM | POA: Diagnosis not present

## 2017-02-28 DIAGNOSIS — N179 Acute kidney failure, unspecified: Secondary | ICD-10-CM | POA: Diagnosis not present

## 2017-02-28 DIAGNOSIS — I2510 Atherosclerotic heart disease of native coronary artery without angina pectoris: Secondary | ICD-10-CM | POA: Diagnosis not present

## 2017-02-28 DIAGNOSIS — I509 Heart failure, unspecified: Secondary | ICD-10-CM | POA: Diagnosis not present

## 2017-03-03 ENCOUNTER — Encounter: Payer: Self-pay | Admitting: Cardiology

## 2017-03-03 ENCOUNTER — Ambulatory Visit (INDEPENDENT_AMBULATORY_CARE_PROVIDER_SITE_OTHER): Payer: Medicare Other | Admitting: Cardiology

## 2017-03-03 VITALS — BP 100/56 | HR 76 | Resp 16 | Ht 65.0 in | Wt 146.0 lb

## 2017-03-03 DIAGNOSIS — C3490 Malignant neoplasm of unspecified part of unspecified bronchus or lung: Secondary | ICD-10-CM

## 2017-03-03 DIAGNOSIS — I214 Non-ST elevation (NSTEMI) myocardial infarction: Secondary | ICD-10-CM

## 2017-03-03 DIAGNOSIS — I5022 Chronic systolic (congestive) heart failure: Secondary | ICD-10-CM

## 2017-03-03 DIAGNOSIS — I2510 Atherosclerotic heart disease of native coronary artery without angina pectoris: Secondary | ICD-10-CM

## 2017-03-03 DIAGNOSIS — I428 Other cardiomyopathies: Secondary | ICD-10-CM

## 2017-03-03 HISTORY — DX: Malignant neoplasm of unspecified part of unspecified bronchus or lung: C34.90

## 2017-03-03 HISTORY — DX: Atherosclerotic heart disease of native coronary artery without angina pectoris: I25.10

## 2017-03-03 HISTORY — DX: Chronic systolic (congestive) heart failure: I50.22

## 2017-03-03 MED ORDER — CLOPIDOGREL BISULFATE 75 MG PO TABS: 75.0000 mg | ORAL_TABLET | Freq: Every day | ORAL | 6 refills | Status: DC

## 2017-03-03 NOTE — Patient Instructions (Signed)
Medication Instructions:  Please stop taking the Brilinta. The provider would like you to start Plavix 75 mg daily. In 2 weeks, call our office and let us know how the medication switch is doing and if the nausea has gotten better.   Labwork: None   Testing/Procedures: None   Follow-Up: Your physician recommends that you schedule a follow-up appointment in: 1 month with Dr. Agustin Cree.   Any Other Special Instructions Will Be Listed Below (If Applicable).     If you need a refill on your cardiac medications before your next appointment, please call your pharmacy.

## 2017-03-03 NOTE — Progress Notes (Signed)
Cardiology Office Note:    Date:  03/03/2017   ID:  Ashley Miles, [name] 10/15/1945, MRN [id number]  PCP:  Ocie Doyne., MD  Cardiologist:  Jenne Campus, MD    Referring MD: Ocie Doyne., MD   Chief Complaint  Patient presents with  . Follow-up  Coronary artery disease, coronary myopathy  History of Present Illness:    Ashley Miles is a 71 y.o. female  with cardiomyopathy, lung cancer which appears to be in remission, penicillin and aspirin about one and a half month ago when she can with non-STEMI. She was also find to have significant coronary myopathy worse than before. She was transferred for cardiac catheterization which showed moderate disease of right coronary artery with completely occluded diagonal branch. Attempt of opening diagonal branch failed, right coronary artery was very small and nondominant therefore was not intervened on. She comes today to my office for follow-up. Chief complaint is nausea and vomiting. She thinks it may be related to some of her medication Brilinta. We had a long discussion about that she would like to try Plavix for 2 weeks to see if she concern about her. Hemodynamically appears to be compensated. On small dose of beta blocker but not ACE inhibitor. We'll retrieve data from hospital regarding her kidney function. Low blood pressure prevent me from increasing dose of beta blocker or adding ACE inhibitor. Therefore for now since she is compensated we'll continue present management.  Past Medical History:  Diagnosis Date  . COPD (chronic obstructive pulmonary disease) ([place])   . GERD (gastroesophageal reflux disease)   . Hyperlipidemia   . Hypertension   . Small cell lung cancer ([place])   . Tobacco use     Past Surgical History:  Procedure Laterality Date  . CESAREAN SECTION    . LEFT HEART CATH AND CORONARY ANGIOGRAPHY N/A 01/09/2017   Procedure: Left Heart Cath and Coronary Angiography;  Surgeon: Burnell Blanks, MD;  Location:  [facility];  Service: Cardiovascular;  Laterality: N/A;  . LUNG CANCER SURGERY  2014    Current Medications: Current Meds  Medication Sig  . aspirin EC 81 MG EC tablet Take 1 tablet (81 mg total) by mouth daily.  Marland Kitchen atorvastatin (LIPITOR) 80 MG tablet Take 1 tablet (80 mg total) by mouth daily at 6 PM.  . buPROPion (WELLBUTRIN SR) 150 MG 12 hr tablet Take 150 mg by mouth 2 (two) times daily.  Marland Kitchen doxylamine, Sleep, (UNISOM) 25 MG tablet Take 25 mg by mouth at bedtime as needed.  . Ferrous Sulfate (FEROSUL PO) Take 325 mg by mouth daily.  Marland Kitchen lubiprostone (AMITIZA) 24 MCG capsule Take 24 mcg by mouth 2 (two) times daily with a meal.  . metoprolol tartrate (LOPRESSOR) 25 MG tablet Take 0.5 tablets (12.5 mg total) by mouth 2 (two) times daily. (Patient taking differently: Take 12.5 mg by mouth at bedtime. )  . morphine (MS CONTIN) 15 MG 12 hr tablet Take 15 mg by mouth 2 (two) times daily.  Marland Kitchen MOVANTIK 25 MG TABS tablet Take 25 mg by mouth daily.  . nitroGLYCERIN (NITROSTAT) 0.4 MG SL tablet Place 1 tablet (0.4 mg total) under the tongue every 5 (five) minutes x 3 doses as needed for chest pain.  Marland Kitchen ondansetron (ZOFRAN) 4 MG tablet Take 4 mg by mouth every 8 (eight) hours as needed for nausea or vomiting.  Marland Kitchen oxyCODONE-acetaminophen (PERCOCET) 10-325 MG tablet Take 1 tablet by mouth every 6 (six) hours as needed  for pain.  . polyethylene glycol (MIRALAX / GLYCOLAX) packet Take 17 g by mouth daily.  . potassium chloride (K-DUR) 10 MEQ tablet Take 10 mEq by mouth daily.  . prochlorperazine (COMPAZINE) 10 MG tablet Take 10 mg by mouth 3 (three) times daily as needed for nausea/vomiting.  . ticagrelor (BRILINTA) 90 MG TABS tablet Take 1 tablet (90 mg total) by mouth 2 (two) times daily.     Allergies:   Patient has no known allergies.   Social History   Social History  . Marital status: Divorced    Spouse name: N/A  . Number of children: N/A  . Years of education: N/A   Social History  Main Topics  . Smoking status: Current Every Day Smoker    Types: Cigarettes  . Smokeless tobacco: Never Used  . Alcohol use No  . Drug use: No  . Sexual activity: Not Asked   Other Topics Concern  . None   Social History Narrative  . None     Family History: The patient's family history includes Blindness in her mother; Cancer in her father; Hypertension in her father. ROS:   Please see the history of present illness.    All 14 point review of systems negative except as described per history of present illness  EKGs/Labs/Other Studies Reviewed:      Recent Labs: 02/03/2017: ALT 10; BUN 14; Creatinine, Ser 1.36; Hemoglobin 10.7; Platelets 406; Potassium 4.8; Sodium 144  Recent Lipid Panel    Component Value Date/Time   CHOL 164 01/11/2017 0521   TRIG 133 01/11/2017 0521   HDL 33 (L) 01/11/2017 0521   CHOLHDL 5.0 01/11/2017 0521   VLDL 27 01/11/2017 0521   LDLCALC 104 (H) 01/11/2017 0521    Physical Exam:    VS:  BP (!) 100/56   Pulse 76   Resp 16   Ht 5\' 5"  (1.651 m)   Wt 146 lb (66.2 kg) Comment: Reported, Wheel chair  BMI 24.30 kg/m     Wt Readings from Last 3 Encounters:  03/03/17 146 lb (66.2 kg)  02/03/17 140 lb 1.9 oz (63.6 kg)  01/13/17 144 lb 6.4 oz (65.5 kg)     GEN:  Well nourished, well developed in no acute distress HEENT: Normal NECK: No JVD; No carotid bruits LYMPHATICS: No lymphadenopathy CARDIAC: RRR, no murmurs, no rubs, no gallops RESPIRATORY:  Clear to auscultation without rales, wheezing or rhonchi  ABDOMEN: Soft, non-tender, non-distended MUSCULOSKELETAL:  No edema; No deformity  SKIN: Warm and dry LOWER EXTREMITIES: no swelling NEUROLOGIC:  Alert and oriented x 3 PSYCHIATRIC:  Normal affect   ASSESSMENT:    1. NSTEMI (non-ST elevated myocardial infarction) ([place])   2. Coronary artery disease involving native coronary artery of native heart without angina pectoris   3. Nonischemic cardiomyopathy ([place])   4. Malignant neoplasm of  lung, unspecified laterality, unspecified part of lung ([place])    PLAN:    In order of problems listed above:  1. History of non-STEMI, seems to be doing well from that point we will continue present management. 2. Nonischemic cardiomyopathy. I cannot increase medication anymore because of low blood pressure. 3. History of lung cancer in remission now.   Medication Adjustments/Labs and Tests Ordered: Current medicines are reviewed at length with the patient today.  Concerns regarding medicines are outlined above.  No orders of the defined types were placed in this encounter.  Medication changes: No orders of the defined types were placed in this encounter.  Signed, Park Liter, MD, [facility] 03/03/2017 4:42 PM    [facility]

## 2017-03-04 DIAGNOSIS — I2510 Atherosclerotic heart disease of native coronary artery without angina pectoris: Secondary | ICD-10-CM | POA: Diagnosis not present

## 2017-03-04 DIAGNOSIS — D649 Anemia, unspecified: Secondary | ICD-10-CM | POA: Diagnosis not present

## 2017-03-04 DIAGNOSIS — R110 Nausea: Secondary | ICD-10-CM | POA: Diagnosis not present

## 2017-03-04 DIAGNOSIS — M549 Dorsalgia, unspecified: Secondary | ICD-10-CM | POA: Diagnosis not present

## 2017-03-04 DIAGNOSIS — Z79899 Other long term (current) drug therapy: Secondary | ICD-10-CM | POA: Diagnosis not present

## 2017-03-07 ENCOUNTER — Encounter: Payer: Self-pay | Admitting: *Deleted

## 2017-04-03 ENCOUNTER — Encounter: Payer: Self-pay | Admitting: Cardiology

## 2017-04-03 ENCOUNTER — Ambulatory Visit (INDEPENDENT_AMBULATORY_CARE_PROVIDER_SITE_OTHER): Payer: Medicare Other | Admitting: Cardiology

## 2017-04-03 VITALS — BP 110/68 | HR 84 | Resp 16 | Ht 65.0 in | Wt 142.8 lb

## 2017-04-03 DIAGNOSIS — I2510 Atherosclerotic heart disease of native coronary artery without angina pectoris: Secondary | ICD-10-CM

## 2017-04-03 DIAGNOSIS — C3490 Malignant neoplasm of unspecified part of unspecified bronchus or lung: Secondary | ICD-10-CM | POA: Diagnosis not present

## 2017-04-03 DIAGNOSIS — E785 Hyperlipidemia, unspecified: Secondary | ICD-10-CM | POA: Diagnosis not present

## 2017-04-03 HISTORY — DX: Hyperlipidemia, unspecified: E78.5

## 2017-04-03 NOTE — Patient Instructions (Signed)
Medication Instructions:  Your physician recommends that you continue on your current medications as directed. Please refer to the Current Medication list given to you today.  Labwork: Your physician recommends that you have labs today.  Testing/Procedures: None   Follow-Up: Your physician recommends that you schedule a follow-up appointment in: 6 weeks   Any Other Special Instructions Will Be Listed Below (If Applicable).  Please note that any paperwork needing to be filled out by the provider will need to be addressed at the front desk prior to seeing the provider. Please note that any paperwork FMLA, Disability or other documents regarding health condition is subject to a $25.00 charge that must be received prior to completion of paperwork in the form of a money order or check.    If you need a refill on your cardiac medications before your next appointment, please call your pharmacy.

## 2017-04-03 NOTE — Progress Notes (Signed)
Cardiology Office Note:    Date:  04/03/2017   ID:  Ashley Miles, [name] Oct 12, 1945, MRN [id number]  PCP:  Ocie Doyne., MD  Cardiologist:  Jenne Campus, MD    Referring MD: Ocie Doyne., MD   Chief Complaint  Patient presents with  . 1 month follow up  Doing much better  History of Present Illness:    Ashley Miles is a 71 y.o. female  with coronary artery disease status post myocardial infarction. Last time I saw her she was taking Brelinta and simply didn't feel well. She is complaining of being tired and fatigued and short of breath ice end up switching her to Plavix and she is feeling dramatically better. Denies having any nausea or vomiting and overall doing well. She said she can go to [place] and do shopping with no difficulties.  Past Medical History:  Diagnosis Date  . COPD (chronic obstructive pulmonary disease) ([place])   . GERD (gastroesophageal reflux disease)   . Hyperlipidemia   . Hypertension   . Small cell lung cancer ([place])   . Tobacco use     Past Surgical History:  Procedure Laterality Date  . CESAREAN SECTION    . LEFT HEART CATH AND CORONARY ANGIOGRAPHY N/A 01/09/2017   Procedure: Left Heart Cath and Coronary Angiography;  Surgeon: Burnell Blanks, MD;  Location: [place] [facility];  Service: Cardiovascular;  Laterality: N/A;  . LUNG CANCER SURGERY  2014    Current Medications: Current Meds  Medication Sig  . aspirin EC 81 MG EC tablet Take 1 tablet (81 mg total) by mouth daily.  Marland Kitchen atorvastatin (LIPITOR) 80 MG tablet Take 1 tablet (80 mg total) by mouth daily at 6 PM.  . buPROPion (WELLBUTRIN SR) 150 MG 12 hr tablet Take 150 mg by mouth 2 (two) times daily.  . clopidogrel (PLAVIX) 75 MG tablet Take 1 tablet (75 mg total) by mouth daily.  Marland Kitchen doxylamine, Sleep, (UNISOM) 25 MG tablet Take 25 mg by mouth at bedtime as needed.  . Ferrous Sulfate (FEROSUL PO) Take 325 mg by mouth daily.  Marland Kitchen lubiprostone (AMITIZA) 24 MCG capsule Take 24 mcg  by mouth 2 (two) times daily with a meal.  . metoprolol tartrate (LOPRESSOR) 25 MG tablet Take 0.5 tablets (12.5 mg total) by mouth 2 (two) times daily. (Patient taking differently: Take 12.5 mg by mouth at bedtime. )  . morphine (MS CONTIN) 15 MG 12 hr tablet Take 15 mg by mouth 2 (two) times daily.  Marland Kitchen MOVANTIK 25 MG TABS tablet Take 25 mg by mouth daily.  . nitroGLYCERIN (NITROSTAT) 0.4 MG SL tablet Place 1 tablet (0.4 mg total) under the tongue every 5 (five) minutes x 3 doses as needed for chest pain.  Marland Kitchen ondansetron (ZOFRAN) 4 MG tablet Take 4 mg by mouth every 8 (eight) hours as needed for nausea or vomiting.  Marland Kitchen oxyCODONE-acetaminophen (PERCOCET) 10-325 MG tablet Take 1 tablet by mouth every 6 (six) hours as needed for pain.  . polyethylene glycol (MIRALAX / GLYCOLAX) packet Take 17 g by mouth daily.  . potassium chloride (K-DUR) 10 MEQ tablet Take 10 mEq by mouth daily.  . prochlorperazine (COMPAZINE) 10 MG tablet Take 10 mg by mouth 3 (three) times daily as needed for nausea/vomiting.     Allergies:   Patient has no known allergies.   Social History   Social History  . Marital status: Divorced    Spouse name: N/A  . Number of children: N/A  .  Years of education: N/A   Social History Main Topics  . Smoking status: Current Every Day Smoker    Types: Cigarettes  . Smokeless tobacco: Never Used  . Alcohol use No  . Drug use: No  . Sexual activity: Not Asked   Other Topics Concern  . None   Social History Narrative  . None     Family History: The patient's family history includes Blindness in her mother; Cancer in her father; Hypertension in her father. ROS:   Please see the history of present illness.    All 14 point review of systems negative except as described per history of present illness  EKGs/Labs/Other Studies Reviewed:      Recent Labs: 02/03/2017: ALT 10; BUN 14; Creatinine, Ser 1.36; Hemoglobin 10.7; Platelets 406; Potassium 4.8; Sodium 144  Recent Lipid  Panel    Component Value Date/Time   CHOL 164 01/11/2017 0521   TRIG 133 01/11/2017 0521   HDL 33 (L) 01/11/2017 0521   CHOLHDL 5.0 01/11/2017 0521   VLDL 27 01/11/2017 0521   LDLCALC 104 (H) 01/11/2017 0521    Physical Exam:    VS:  BP 110/68   Pulse 84   Resp 16   Ht 5\' 5"  (1.651 m)   Wt 142 lb 12.8 oz (64.8 kg)   BMI 23.76 kg/m     Wt Readings from Last 3 Encounters:  04/03/17 142 lb 12.8 oz (64.8 kg)  03/03/17 146 lb (66.2 kg)  02/03/17 140 lb 1.9 oz (63.6 kg)     GEN:  Well nourished, well developed in no acute distress HEENT: Normal NECK: No JVD; No carotid bruits LYMPHATICS: No lymphadenopathy CARDIAC: RRR, no murmurs, no rubs, no gallops RESPIRATORY:  Clear to auscultation without rales, wheezing or rhonchi  ABDOMEN: Soft, non-tender, non-distended MUSCULOSKELETAL:  No edema; No deformity  SKIN: Warm and dry LOWER EXTREMITIES: no swelling NEUROLOGIC:  Alert and oriented x 3 PSYCHIATRIC:  Normal affect   ASSESSMENT:    1. Coronary artery disease involving native coronary artery of native heart without angina pectoris   2. Malignant neoplasm of lung, unspecified laterality, unspecified part of lung ([place])   3. Dyslipidemia    PLAN:    In order of problems listed above:  1. Coronary artery disease: Doing well from that point of view we'll continue present management. 2. Malignant lung cancer followed by oncologist. 3. Dyslipidemia: We'll check her cholesterol today. I will also ask him to have Chem-7 checked. Based on the results of this test will be able to determine if we can put her on small dose of ACE inhibitor   Medication Adjustments/Labs and Tests Ordered: Current medicines are reviewed at length with the patient today.  Concerns regarding medicines are outlined above.  No orders of the defined types were placed in this encounter.  Medication changes: No orders of the defined types were placed in this encounter.   Signed, Park Liter,  MD, [facility] 04/03/2017 4:01 PM    [facility]

## 2017-04-04 ENCOUNTER — Telehealth: Payer: Self-pay

## 2017-04-04 LAB — BASIC METABOLIC PANEL
BUN / CREAT RATIO: 9 — AB (ref 12–28)
BUN: 11 mg/dL (ref 8–27)
CO2: 20 mmol/L (ref 20–29)
CREATININE: 1.2 mg/dL — AB (ref 0.57–1.00)
Calcium: 9.3 mg/dL (ref 8.7–10.3)
Chloride: 105 mmol/L (ref 96–106)
GFR calc non Af Amer: 46 mL/min/{1.73_m2} — ABNORMAL LOW (ref >59)
GFR, EST AFRICAN AMERICAN: 53 mL/min/{1.73_m2} — AB (ref >59)
Glucose: 109 mg/dL — ABNORMAL HIGH (ref 65–99)
Potassium: 4.7 mmol/L (ref 3.5–5.2)
SODIUM: 141 mmol/L (ref 134–144)

## 2017-04-04 LAB — LIPID PANEL
CHOL/HDL RATIO: 3.8 ratio (ref 0.0–4.4)
Cholesterol, Total: 203 mg/dL — ABNORMAL HIGH (ref 100–199)
HDL: 54 mg/dL (ref >39)
LDL CALC: 126 mg/dL — AB (ref 0–99)
TRIGLYCERIDES: 115 mg/dL (ref 0–149)
VLDL Cholesterol Cal: 23 mg/dL (ref 5–40)

## 2017-04-04 MED ORDER — EZETIMIBE 10 MG PO TABS: 10.0000 mg | ORAL_TABLET | Freq: Every day | ORAL | 6 refills | Status: DC

## 2017-04-04 NOTE — Telephone Encounter (Signed)
-----   Message from Park Liter, MD sent at 04/04/2017  8:33 AM EDT ----- Chol still elevated, please ask her if she is taking lipitor if not she needs to take it if yes add zetia 10 mg po qd

## 2017-04-08 DIAGNOSIS — E538 Deficiency of other specified B group vitamins: Secondary | ICD-10-CM | POA: Diagnosis not present

## 2017-04-08 DIAGNOSIS — E784 Other hyperlipidemia: Secondary | ICD-10-CM | POA: Diagnosis not present

## 2017-04-08 DIAGNOSIS — D649 Anemia, unspecified: Secondary | ICD-10-CM | POA: Diagnosis not present

## 2017-04-08 DIAGNOSIS — C3490 Malignant neoplasm of unspecified part of unspecified bronchus or lung: Secondary | ICD-10-CM | POA: Diagnosis not present

## 2017-04-08 DIAGNOSIS — K253 Acute gastric ulcer without hemorrhage or perforation: Secondary | ICD-10-CM | POA: Diagnosis not present

## 2017-05-07 DIAGNOSIS — Z923 Personal history of irradiation: Secondary | ICD-10-CM | POA: Diagnosis not present

## 2017-05-07 DIAGNOSIS — N189 Chronic kidney disease, unspecified: Secondary | ICD-10-CM | POA: Diagnosis not present

## 2017-05-07 DIAGNOSIS — I2510 Atherosclerotic heart disease of native coronary artery without angina pectoris: Secondary | ICD-10-CM | POA: Diagnosis not present

## 2017-05-07 DIAGNOSIS — G8929 Other chronic pain: Secondary | ICD-10-CM | POA: Diagnosis not present

## 2017-05-07 DIAGNOSIS — D500 Iron deficiency anemia secondary to blood loss (chronic): Secondary | ICD-10-CM | POA: Diagnosis not present

## 2017-05-07 DIAGNOSIS — Z85118 Personal history of other malignant neoplasm of bronchus and lung: Secondary | ICD-10-CM | POA: Diagnosis not present

## 2017-05-07 DIAGNOSIS — Z9221 Personal history of antineoplastic chemotherapy: Secondary | ICD-10-CM | POA: Diagnosis not present

## 2017-05-07 DIAGNOSIS — E876 Hypokalemia: Secondary | ICD-10-CM | POA: Diagnosis not present

## 2017-05-07 DIAGNOSIS — I252 Old myocardial infarction: Secondary | ICD-10-CM | POA: Diagnosis not present

## 2017-05-07 DIAGNOSIS — M4680 Other specified inflammatory spondylopathies, site unspecified: Secondary | ICD-10-CM | POA: Diagnosis not present

## 2017-05-12 DIAGNOSIS — C3490 Malignant neoplasm of unspecified part of unspecified bronchus or lung: Secondary | ICD-10-CM | POA: Diagnosis not present

## 2017-05-12 DIAGNOSIS — E538 Deficiency of other specified B group vitamins: Secondary | ICD-10-CM | POA: Diagnosis not present

## 2017-05-12 DIAGNOSIS — D649 Anemia, unspecified: Secondary | ICD-10-CM | POA: Diagnosis not present

## 2017-05-12 DIAGNOSIS — K253 Acute gastric ulcer without hemorrhage or perforation: Secondary | ICD-10-CM | POA: Diagnosis not present

## 2017-05-12 DIAGNOSIS — M549 Dorsalgia, unspecified: Secondary | ICD-10-CM | POA: Diagnosis not present

## 2017-05-12 DIAGNOSIS — E876 Hypokalemia: Secondary | ICD-10-CM | POA: Diagnosis not present

## 2017-05-13 DIAGNOSIS — R112 Nausea with vomiting, unspecified: Secondary | ICD-10-CM | POA: Diagnosis not present

## 2017-05-15 ENCOUNTER — Ambulatory Visit: Payer: Medicare Other | Admitting: Cardiology

## 2017-05-28 ENCOUNTER — Encounter: Payer: Self-pay | Admitting: Cardiology

## 2017-05-28 ENCOUNTER — Ambulatory Visit (INDEPENDENT_AMBULATORY_CARE_PROVIDER_SITE_OTHER): Payer: Medicare Other | Admitting: Cardiology

## 2017-05-28 VITALS — BP 120/74 | HR 76 | Resp 16 | Ht 65.0 in | Wt 142.1 lb

## 2017-05-28 DIAGNOSIS — I5022 Chronic systolic (congestive) heart failure: Secondary | ICD-10-CM | POA: Diagnosis not present

## 2017-05-28 DIAGNOSIS — C3490 Malignant neoplasm of unspecified part of unspecified bronchus or lung: Secondary | ICD-10-CM | POA: Diagnosis not present

## 2017-05-28 DIAGNOSIS — I5021 Acute systolic (congestive) heart failure: Secondary | ICD-10-CM | POA: Diagnosis not present

## 2017-05-28 DIAGNOSIS — I2510 Atherosclerotic heart disease of native coronary artery without angina pectoris: Secondary | ICD-10-CM | POA: Diagnosis not present

## 2017-05-28 DIAGNOSIS — E785 Hyperlipidemia, unspecified: Secondary | ICD-10-CM | POA: Diagnosis not present

## 2017-05-28 NOTE — Patient Instructions (Signed)
Medication Instructions:  Your physician recommends that you continue on your current medications as directed. Please refer to the Current Medication list given to you today.   Labwork: Basic metabolic panel today  Testing/Procedures: None  Follow-Up: Your physician recommends that you schedule a follow-up appointment in: 4 months  Any Other Special Instructions Will Be Listed Below (If Applicable).     If you need a refill on your cardiac medications before your next appointment, please call your pharmacy.   [place], RN, BSN

## 2017-05-28 NOTE — Progress Notes (Signed)
Cardiology Office Note:    Date:  05/28/2017   ID:  Ashley Miles, [name] 09-19-1945, MRN [id number]  PCP:  Ocie Doyne., MD  Cardiologist:  Jenne Campus, MD    Referring MD: Ocie Doyne., MD   Chief Complaint  Patient presents with  . 6 week follow up  I'm doing great  History of Present Illness:    Ashley Miles is a 71 y.o. female  with nonischemic cardiac myopathy, lung cancer, coronary artery disease she said she feels great no chest pain tightness squeezing pressure burn chest. Shortness of breath improved quite significantly. Denies having any palpitations or dizziness, no swelling of lower extremities overall she feels good.  Past Medical History:  Diagnosis Date  . COPD (chronic obstructive pulmonary disease) ([place])   . GERD (gastroesophageal reflux disease)   . Hyperlipidemia   . Hypertension   . Small cell lung cancer ([place])   . Tobacco use     Past Surgical History:  Procedure Laterality Date  . CESAREAN SECTION    . LEFT HEART CATH AND CORONARY ANGIOGRAPHY N/A 01/09/2017   Procedure: Left Heart Cath and Coronary Angiography;  Surgeon: Burnell Blanks, MD;  Location: [place] [facility];  Service: Cardiovascular;  Laterality: N/A;  . LUNG CANCER SURGERY  2014    Current Medications: Current Meds  Medication Sig  . aspirin EC 81 MG EC tablet Take 1 tablet (81 mg total) by mouth daily.  Marland Kitchen atorvastatin (LIPITOR) 80 MG tablet Take 1 tablet (80 mg total) by mouth daily at 6 PM.  . buPROPion (WELLBUTRIN SR) 150 MG 12 hr tablet Take 150 mg by mouth 2 (two) times daily.  . clopidogrel (PLAVIX) 75 MG tablet Take 1 tablet (75 mg total) by mouth daily.  Marland Kitchen doxylamine, Sleep, (UNISOM) 25 MG tablet Take 25 mg by mouth at bedtime as needed.  . ezetimibe (ZETIA) 10 MG tablet Take 1 tablet (10 mg total) by mouth daily.  . Ferrous Sulfate (FEROSUL PO) Take 325 mg by mouth daily.  Marland Kitchen lubiprostone (AMITIZA) 24 MCG capsule Take 24 mcg by mouth 2 (two) times daily  with a meal.  . metoprolol tartrate (LOPRESSOR) 25 MG tablet Take 0.5 tablets (12.5 mg total) by mouth 2 (two) times daily. (Patient taking differently: Take 12.5 mg by mouth at bedtime. )  . morphine (MS CONTIN) 15 MG 12 hr tablet Take 15 mg by mouth 2 (two) times daily.  Marland Kitchen MOVANTIK 25 MG TABS tablet Take 25 mg by mouth daily.  . nitroGLYCERIN (NITROSTAT) 0.4 MG SL tablet Place 1 tablet (0.4 mg total) under the tongue every 5 (five) minutes x 3 doses as needed for chest pain.  Marland Kitchen ondansetron (ZOFRAN) 4 MG tablet Take 4 mg by mouth every 8 (eight) hours as needed for nausea or vomiting.  Marland Kitchen oxyCODONE-acetaminophen (PERCOCET) 10-325 MG tablet Take 1 tablet by mouth every 6 (six) hours as needed for pain.  . polyethylene glycol (MIRALAX / GLYCOLAX) packet Take 17 g by mouth daily.  . potassium chloride (K-DUR) 10 MEQ tablet Take 10 mEq by mouth daily.  . prochlorperazine (COMPAZINE) 10 MG tablet Take 10 mg by mouth 3 (three) times daily as needed for nausea/vomiting.     Allergies:   Patient has no known allergies.   Social History   Social History  . Marital status: Divorced    Spouse name: N/A  . Number of children: N/A  . Years of education: N/A   Social History Main  Topics  . Smoking status: Current Every Day Smoker    Types: Cigarettes  . Smokeless tobacco: Never Used  . Alcohol use No  . Drug use: No  . Sexual activity: Not Asked   Other Topics Concern  . None   Social History Narrative  . None     Family History: The patient's family history includes Blindness in her mother; Cancer in her father; Hypertension in her father. ROS:   Please see the history of present illness.    All 14 point review of systems negative except as described per history of present illness  EKGs/Labs/Other Studies Reviewed:      Recent Labs: 02/03/2017: ALT 10; Hemoglobin 10.7; Platelets 406 04/03/2017: BUN 11; Creatinine, Ser 1.20; Potassium 4.7; Sodium 141  Recent Lipid Panel      Component Value Date/Time   CHOL 203 (H) 04/03/2017 1651   TRIG 115 04/03/2017 1651   HDL 54 04/03/2017 1651   CHOLHDL 3.8 04/03/2017 1651   CHOLHDL 5.0 01/11/2017 0521   VLDL 27 01/11/2017 0521   LDLCALC 126 (H) 04/03/2017 1651    Physical Exam:    VS:  BP 120/74   Pulse 76   Resp 16   Ht 5\' 5"  (1.651 m)   Wt 142 lb 1.9 oz (64.5 kg)   BMI 23.65 kg/m     Wt Readings from Last 3 Encounters:  05/28/17 142 lb 1.9 oz (64.5 kg)  04/03/17 142 lb 12.8 oz (64.8 kg)  03/03/17 146 lb (66.2 kg)     GEN:  Well nourished, well developed in no acute distress HEENT: Normal NECK: No JVD; No carotid bruits LYMPHATICS: No lymphadenopathy CARDIAC: RRR, no murmurs, no rubs, no gallops RESPIRATORY:  Clear to auscultation without rales, wheezing or rhonchi  ABDOMEN: Soft, non-tender, non-distended MUSCULOSKELETAL:  No edema; No deformity  SKIN: Warm and dry LOWER EXTREMITIES: no swelling NEUROLOGIC:  Alert and oriented x 3 PSYCHIATRIC:  Normal affect   ASSESSMENT:    1. Acute systolic heart failure ([place])   2. Chronic systolic heart failure ([place])   3. Coronary artery disease involving native coronary artery of native heart without angina pectoris   4. Malignant neoplasm of lung, unspecified laterality, unspecified part of lung ([place])   5. Dyslipidemia    PLAN:    In order of problems listed above:  1. Coronary artery disease: Noncritical continue conservative approach because of comorbidities 2. Congestive heart faint: Appears to be compensated. I will check Chem-7 to see if I can start some ACE inhibitor. 3. Malignant neoplasm of lungs: Followed by oncology 4. Dyslipidemia: We'll continue with statin   Medication Adjustments/Labs and Tests Ordered: Current medicines are reviewed at length with the patient today.  Concerns regarding medicines are outlined above.  Orders Placed This Encounter  Procedures  . Basic Metabolic Panel (BMET)   Medication changes: No orders of the  defined types were placed in this encounter.   Signed, Park Liter, MD, [facility] 05/28/2017 4:27 PM    [place]

## 2017-05-29 LAB — BASIC METABOLIC PANEL
BUN/Creatinine Ratio: 8 — ABNORMAL LOW (ref 12–28)
BUN: 10 mg/dL (ref 8–27)
CHLORIDE: 100 mmol/L (ref 96–106)
CO2: 25 mmol/L (ref 20–29)
Calcium: 9.5 mg/dL (ref 8.7–10.3)
Creatinine, Ser: 1.2 mg/dL — ABNORMAL HIGH (ref 0.57–1.00)
GFR calc Af Amer: 53 mL/min/{1.73_m2} — ABNORMAL LOW (ref >59)
GFR calc non Af Amer: 46 mL/min/{1.73_m2} — ABNORMAL LOW (ref >59)
GLUCOSE: 103 mg/dL — AB (ref 65–99)
POTASSIUM: 4.9 mmol/L (ref 3.5–5.2)
SODIUM: 139 mmol/L (ref 134–144)

## 2017-06-17 DIAGNOSIS — K5900 Constipation, unspecified: Secondary | ICD-10-CM | POA: Diagnosis not present

## 2017-06-17 DIAGNOSIS — M549 Dorsalgia, unspecified: Secondary | ICD-10-CM | POA: Diagnosis not present

## 2017-06-17 DIAGNOSIS — E538 Deficiency of other specified B group vitamins: Secondary | ICD-10-CM | POA: Diagnosis not present

## 2017-06-17 DIAGNOSIS — E785 Hyperlipidemia, unspecified: Secondary | ICD-10-CM | POA: Diagnosis not present

## 2017-06-17 DIAGNOSIS — I2510 Atherosclerotic heart disease of native coronary artery without angina pectoris: Secondary | ICD-10-CM | POA: Diagnosis not present

## 2017-06-17 DIAGNOSIS — C3490 Malignant neoplasm of unspecified part of unspecified bronchus or lung: Secondary | ICD-10-CM | POA: Diagnosis not present

## 2017-07-17 DIAGNOSIS — M25512 Pain in left shoulder: Secondary | ICD-10-CM | POA: Diagnosis not present

## 2017-07-17 DIAGNOSIS — I2510 Atherosclerotic heart disease of native coronary artery without angina pectoris: Secondary | ICD-10-CM | POA: Diagnosis not present

## 2017-07-17 DIAGNOSIS — E538 Deficiency of other specified B group vitamins: Secondary | ICD-10-CM | POA: Diagnosis not present

## 2017-07-17 DIAGNOSIS — E785 Hyperlipidemia, unspecified: Secondary | ICD-10-CM | POA: Diagnosis not present

## 2017-07-17 DIAGNOSIS — K253 Acute gastric ulcer without hemorrhage or perforation: Secondary | ICD-10-CM | POA: Diagnosis not present

## 2017-08-19 DIAGNOSIS — I252 Old myocardial infarction: Secondary | ICD-10-CM | POA: Diagnosis not present

## 2017-08-19 DIAGNOSIS — I509 Heart failure, unspecified: Secondary | ICD-10-CM | POA: Diagnosis not present

## 2017-08-19 DIAGNOSIS — R079 Chest pain, unspecified: Secondary | ICD-10-CM | POA: Diagnosis not present

## 2017-08-19 DIAGNOSIS — R109 Unspecified abdominal pain: Secondary | ICD-10-CM | POA: Diagnosis not present

## 2017-08-19 DIAGNOSIS — R1013 Epigastric pain: Secondary | ICD-10-CM | POA: Diagnosis not present

## 2017-08-19 DIAGNOSIS — R110 Nausea: Secondary | ICD-10-CM | POA: Diagnosis not present

## 2017-08-19 DIAGNOSIS — I110 Hypertensive heart disease with heart failure: Secondary | ICD-10-CM | POA: Diagnosis not present

## 2017-08-19 DIAGNOSIS — R911 Solitary pulmonary nodule: Secondary | ICD-10-CM | POA: Diagnosis not present

## 2017-08-19 DIAGNOSIS — J449 Chronic obstructive pulmonary disease, unspecified: Secondary | ICD-10-CM | POA: Diagnosis not present

## 2017-08-19 DIAGNOSIS — J8410 Pulmonary fibrosis, unspecified: Secondary | ICD-10-CM | POA: Diagnosis not present

## 2017-08-19 DIAGNOSIS — I2510 Atherosclerotic heart disease of native coronary artery without angina pectoris: Secondary | ICD-10-CM | POA: Diagnosis not present

## 2017-09-02 DIAGNOSIS — D649 Anemia, unspecified: Secondary | ICD-10-CM | POA: Diagnosis not present

## 2017-09-02 DIAGNOSIS — I2510 Atherosclerotic heart disease of native coronary artery without angina pectoris: Secondary | ICD-10-CM | POA: Diagnosis not present

## 2017-09-02 DIAGNOSIS — E876 Hypokalemia: Secondary | ICD-10-CM | POA: Diagnosis not present

## 2017-09-02 DIAGNOSIS — E538 Deficiency of other specified B group vitamins: Secondary | ICD-10-CM | POA: Diagnosis not present

## 2017-09-02 DIAGNOSIS — C3490 Malignant neoplasm of unspecified part of unspecified bronchus or lung: Secondary | ICD-10-CM | POA: Diagnosis not present

## 2017-09-02 DIAGNOSIS — K5900 Constipation, unspecified: Secondary | ICD-10-CM | POA: Diagnosis not present

## 2017-09-02 DIAGNOSIS — K922 Gastrointestinal hemorrhage, unspecified: Secondary | ICD-10-CM | POA: Diagnosis not present

## 2017-09-03 DIAGNOSIS — R918 Other nonspecific abnormal finding of lung field: Secondary | ICD-10-CM | POA: Diagnosis not present

## 2017-09-03 DIAGNOSIS — Z9221 Personal history of antineoplastic chemotherapy: Secondary | ICD-10-CM | POA: Diagnosis not present

## 2017-09-03 DIAGNOSIS — Z85118 Personal history of other malignant neoplasm of bronchus and lung: Secondary | ICD-10-CM | POA: Diagnosis not present

## 2017-09-03 DIAGNOSIS — Z923 Personal history of irradiation: Secondary | ICD-10-CM | POA: Diagnosis not present

## 2017-10-06 DIAGNOSIS — J189 Pneumonia, unspecified organism: Secondary | ICD-10-CM | POA: Diagnosis not present

## 2017-10-06 DIAGNOSIS — Z79899 Other long term (current) drug therapy: Secondary | ICD-10-CM | POA: Diagnosis not present

## 2017-10-06 DIAGNOSIS — E538 Deficiency of other specified B group vitamins: Secondary | ICD-10-CM | POA: Diagnosis not present

## 2017-10-06 DIAGNOSIS — D649 Anemia, unspecified: Secondary | ICD-10-CM | POA: Diagnosis not present

## 2017-10-06 DIAGNOSIS — M549 Dorsalgia, unspecified: Secondary | ICD-10-CM | POA: Diagnosis not present

## 2017-10-06 DIAGNOSIS — C3490 Malignant neoplasm of unspecified part of unspecified bronchus or lung: Secondary | ICD-10-CM | POA: Diagnosis not present

## 2017-11-04 ENCOUNTER — Ambulatory Visit: Payer: Medicare Other | Admitting: Cardiology

## 2017-11-06 DIAGNOSIS — K253 Acute gastric ulcer without hemorrhage or perforation: Secondary | ICD-10-CM | POA: Diagnosis not present

## 2017-11-06 DIAGNOSIS — M818 Other osteoporosis without current pathological fracture: Secondary | ICD-10-CM | POA: Diagnosis not present

## 2017-11-06 DIAGNOSIS — I2510 Atherosclerotic heart disease of native coronary artery without angina pectoris: Secondary | ICD-10-CM | POA: Diagnosis not present

## 2017-11-06 DIAGNOSIS — D649 Anemia, unspecified: Secondary | ICD-10-CM | POA: Diagnosis not present

## 2017-11-06 DIAGNOSIS — E538 Deficiency of other specified B group vitamins: Secondary | ICD-10-CM | POA: Diagnosis not present

## 2017-11-06 DIAGNOSIS — R110 Nausea: Secondary | ICD-10-CM | POA: Diagnosis not present

## 2017-11-06 DIAGNOSIS — R739 Hyperglycemia, unspecified: Secondary | ICD-10-CM | POA: Diagnosis not present

## 2017-11-06 DIAGNOSIS — E785 Hyperlipidemia, unspecified: Secondary | ICD-10-CM | POA: Diagnosis not present

## 2017-11-20 ENCOUNTER — Encounter: Payer: Self-pay | Admitting: Cardiology

## 2017-11-20 ENCOUNTER — Encounter: Payer: Self-pay | Admitting: *Deleted

## 2017-11-20 ENCOUNTER — Ambulatory Visit (INDEPENDENT_AMBULATORY_CARE_PROVIDER_SITE_OTHER): Payer: Medicare Other | Admitting: Cardiology

## 2017-11-20 VITALS — BP 104/60 | HR 68 | Ht 65.0 in | Wt 148.8 lb

## 2017-11-20 DIAGNOSIS — F17200 Nicotine dependence, unspecified, uncomplicated: Secondary | ICD-10-CM | POA: Insufficient documentation

## 2017-11-20 DIAGNOSIS — IMO0001 Reserved for inherently not codable concepts without codable children: Secondary | ICD-10-CM

## 2017-11-20 DIAGNOSIS — E785 Hyperlipidemia, unspecified: Secondary | ICD-10-CM

## 2017-11-20 DIAGNOSIS — I5022 Chronic systolic (congestive) heart failure: Secondary | ICD-10-CM | POA: Diagnosis not present

## 2017-11-20 DIAGNOSIS — C3490 Malignant neoplasm of unspecified part of unspecified bronchus or lung: Secondary | ICD-10-CM | POA: Diagnosis not present

## 2017-11-20 DIAGNOSIS — I2510 Atherosclerotic heart disease of native coronary artery without angina pectoris: Secondary | ICD-10-CM | POA: Diagnosis not present

## 2017-11-20 HISTORY — DX: Reserved for inherently not codable concepts without codable children: IMO0001

## 2017-11-20 HISTORY — DX: Nicotine dependence, unspecified, uncomplicated: F17.200

## 2017-11-20 NOTE — Progress Notes (Signed)
Cardiology Office Note:    Date:  11/20/2017   ID:  Ashley Miles, [name] Sep 23, 1945, MRN [id number]  PCP:  Ashley Miles., MD  Cardiologist:  Jenne Campus, MD    Referring MD: Ashley Miles., MD   Chief Complaint  Patient presents with  . Follow-up  Doing well  History of Present Illness:    Ashley Miles is a 72 y.o. female with coronary artery disease, cardiomyopathy severely diminished ejection fraction, smoking, history of lung cancer.  She comes today for follow-up she says she is doing well complaint of having shortness of breath as usual.  Interesting on my questioning her about her lung condition her lung cancer she said she does not know anything about it.  Denies have any chest pain tightness squeezing pressure burning chest.  Past Medical History:  Diagnosis Date  . COPD (chronic obstructive pulmonary disease) ([place])   . GERD (gastroesophageal reflux disease)   . Hyperlipidemia   . Hypertension   . Small cell lung cancer ([place])   . Tobacco use     Past Surgical History:  Procedure Laterality Date  . CESAREAN SECTION    . LEFT HEART CATH AND CORONARY ANGIOGRAPHY N/A 01/09/2017   Procedure: Left Heart Cath and Coronary Angiography;  Surgeon: Burnell Blanks, MD;  Location: [place] [facility];  Service: Cardiovascular;  Laterality: N/A;  . LUNG CANCER SURGERY  2014    Current Medications: Current Meds  Medication Sig  . aspirin EC 81 MG EC tablet Take 1 tablet (81 mg total) by mouth daily.  Marland Kitchen buPROPion (WELLBUTRIN SR) 150 MG 12 hr tablet Take 150 mg by mouth 2 (two) times daily.  . clopidogrel (PLAVIX) 75 MG tablet Take 1 tablet (75 mg total) by mouth daily.  Marland Kitchen doxylamine, Sleep, (UNISOM) 25 MG tablet Take 25 mg by mouth at bedtime as needed.  . ezetimibe (ZETIA) 10 MG tablet Take 1 tablet (10 mg total) by mouth daily.  . Ferrous Sulfate (FEROSUL PO) Take 325 mg by mouth daily.  Marland Kitchen lubiprostone (AMITIZA) 24 MCG capsule Take 24 mcg by mouth 2 (two)  times daily with a meal.  . metoprolol tartrate (LOPRESSOR) 25 MG tablet Take 0.5 tablets (12.5 mg total) by mouth 2 (two) times daily. (Patient taking differently: Take 12.5 mg by mouth at bedtime. )  . morphine (MS CONTIN) 15 MG 12 hr tablet Take 15 mg by mouth 2 (two) times daily.  Marland Kitchen MOVANTIK 25 MG TABS tablet Take 25 mg by mouth daily.  . nitroGLYCERIN (NITROSTAT) 0.4 MG SL tablet Place 1 tablet (0.4 mg total) under the tongue every 5 (five) minutes x 3 doses as needed for chest pain.  Marland Kitchen ondansetron (ZOFRAN) 4 MG tablet Take 4 mg by mouth every 8 (eight) hours as needed for nausea or vomiting.  Marland Kitchen oxyCODONE-acetaminophen (PERCOCET) 10-325 MG tablet Take 1 tablet by mouth every 6 (six) hours as needed for pain.  . polyethylene glycol (MIRALAX / GLYCOLAX) packet Take 17 g by mouth daily.  . potassium chloride (K-DUR) 10 MEQ tablet Take 10 mEq by mouth daily.  . prochlorperazine (COMPAZINE) 10 MG tablet Take 10 mg by mouth 3 (three) times daily as needed for nausea/vomiting.  . rosuvastatin (CRESTOR) 10 MG tablet Take 1 tablet by mouth daily.  . Vitamin D, Ergocalciferol, (DRISDOL) 50000 units CAPS capsule Take 50,000 Units by mouth every 7 (seven) days.     Allergies:   Patient has no known allergies.   Social History  Socioeconomic History  . Marital status: Divorced    Spouse name: Not on file  . Number of children: Not on file  . Years of education: Not on file  . Highest education level: Not on file  Occupational History  . Not on file  Social Needs  . Financial resource strain: Not on file  . Food insecurity:    Worry: Not on file    Inability: Not on file  . Transportation needs:    Medical: Not on file    Non-medical: Not on file  Tobacco Use  . Smoking status: Current Every Day Smoker    Types: Cigarettes  . Smokeless tobacco: Never Used  Substance and Sexual Activity  . Alcohol use: No  . Drug use: No  . Sexual activity: Not on file  Lifestyle  . Physical  activity:    Days per week: Not on file    Minutes per session: Not on file  . Stress: Not on file  Relationships  . Social connections:    Talks on phone: Not on file    Gets together: Not on file    Attends religious service: Not on file    Active member of club or organization: Not on file    Attends meetings of clubs or organizations: Not on file    Relationship status: Not on file  Other Topics Concern  . Not on file  Social History Narrative  . Not on file     Family History: The patient's family history includes Blindness in her mother; Cancer in her father; Hypertension in her father. ROS:   Please see the history of present illness.    All 14 point review of systems negative except as described per history of present illness  EKGs/Labs/Other Studies Reviewed:      Recent Labs: 02/03/2017: ALT 10; Hemoglobin 10.7; Platelets 406 05/28/2017: BUN 10; Creatinine, Ser 1.20; Potassium 4.9; Sodium 139  Recent Lipid Panel    Component Value Date/Time   CHOL 203 (H) 04/03/2017 1651   TRIG 115 04/03/2017 1651   HDL 54 04/03/2017 1651   CHOLHDL 3.8 04/03/2017 1651   CHOLHDL 5.0 01/11/2017 0521   VLDL 27 01/11/2017 0521   LDLCALC 126 (H) 04/03/2017 1651    Physical Exam:    VS:  BP 104/60   Pulse 68   Ht 5\' 5"  (1.651 m)   Wt 148 lb 12.8 oz (67.5 kg)   SpO2 97%   BMI 24.76 kg/m     Wt Readings from Last 3 Encounters:  11/20/17 148 lb 12.8 oz (67.5 kg)  05/28/17 142 lb 1.9 oz (64.5 kg)  04/03/17 142 lb 12.8 oz (64.8 kg)     GEN:  Well nourished, well developed in no acute distress HEENT: Normal NECK: No JVD; No carotid bruits LYMPHATICS: No lymphadenopathy CARDIAC: RRR, no murmurs, no rubs, no gallops RESPIRATORY: Poor air entry bilaterally with bilateral wheezes ABDOMEN: Soft, non-tender, non-distended MUSCULOSKELETAL:  No edema; No deformity  SKIN: Warm and dry LOWER EXTREMITIES: no swelling NEUROLOGIC:  Alert and oriented x 3 PSYCHIATRIC:  Normal  affect   ASSESSMENT:    1. Chronic systolic heart failure ([place])   2. Coronary artery disease involving native coronary artery of native heart without angina pectoris   3. Dyslipidemia   4. Malignant neoplasm of lung, unspecified laterality, unspecified part of lung ([place])   5. Smoking    PLAN:    In order of problems listed above:  1. Coronary artery disease doing  well asymptomatic from that point review on appropriate medications which I will continue 2. Dyslipidemia: We will contact her primary care physician to get fasting lipid profile apparently recently her Crestor has been increased. 3. Smoking: Still a problem of course we talked about this for at least 5 minutes and I strongly recommended to quit. 4. Cardiomyopathy: We will schedule her to have echocardiogram to reassess left ventricular ejection fraction to decide about how aggressive treatment should be.   Medication Adjustments/Labs and Tests Ordered: Current medicines are reviewed at length with the patient today.  Concerns regarding medicines are outlined above.  No orders of the defined types were placed in this encounter.  Medication changes: No orders of the defined types were placed in this encounter.   Signed, Park Liter, MD, [facility] 11/20/2017 4:34 PM    [place]

## 2017-11-20 NOTE — Patient Instructions (Signed)
Medication Instructions:  Your physician recommends that you continue on your current medications as directed. Please refer to the Current Medication list given to you today.   Labwork: None  Testing/Procedures: Your physician has requested that you have an echocardiogram. Echocardiography is a painless test that uses sound waves to create images of your heart. It provides your doctor with information about the size and shape of your heart and how well your heart's chambers and valves are working. This procedure takes approximately one hour. There are no restrictions for this procedure.  Follow-Up: Your physician wants you to follow-up in: 3 months. You will receive a reminder letter in the mail two months in advance. If you don't receive a letter, please call our office to schedule the follow-up appointment.  Any Other Special Instructions Will Be Listed Below (If Applicable).     If you need a refill on your cardiac medications before your next appointment, please call your pharmacy.

## 2017-11-25 ENCOUNTER — Ambulatory Visit (HOSPITAL_BASED_OUTPATIENT_CLINIC_OR_DEPARTMENT_OTHER): Payer: Medicare Other

## 2017-11-26 ENCOUNTER — Telehealth: Payer: Self-pay | Admitting: Cardiology

## 2017-11-26 NOTE — Telephone Encounter (Signed)
Patient wants her Echo to be done at [facility].

## 2017-11-26 NOTE — Telephone Encounter (Signed)
Switched order location in Epic and sent for precert. Will contact patient with date and time when we get precert authorization number.

## 2017-11-27 NOTE — Telephone Encounter (Signed)
Ashley Miles called and informed patient of echocardiogram on 12/02/17 at 8:00 am at [facility].

## 2017-12-02 DIAGNOSIS — I5022 Chronic systolic (congestive) heart failure: Secondary | ICD-10-CM | POA: Diagnosis not present

## 2017-12-02 DIAGNOSIS — I509 Heart failure, unspecified: Secondary | ICD-10-CM | POA: Diagnosis not present

## 2017-12-03 ENCOUNTER — Other Ambulatory Visit: Payer: Self-pay | Admitting: *Deleted

## 2017-12-03 DIAGNOSIS — I5022 Chronic systolic (congestive) heart failure: Secondary | ICD-10-CM

## 2017-12-08 ENCOUNTER — Telehealth: Payer: Self-pay

## 2017-12-08 ENCOUNTER — Telehealth: Payer: Self-pay | Admitting: *Deleted

## 2017-12-08 NOTE — Telephone Encounter (Signed)
Left message to return call regarding echocardiogram results.

## 2017-12-08 NOTE — Telephone Encounter (Signed)
Patient has been advised of the results of her results from her echo at [facility] that her current EF is at 45 % and we will continue to monitor.

## 2017-12-09 DIAGNOSIS — C3490 Malignant neoplasm of unspecified part of unspecified bronchus or lung: Secondary | ICD-10-CM | POA: Diagnosis not present

## 2017-12-09 DIAGNOSIS — E876 Hypokalemia: Secondary | ICD-10-CM | POA: Diagnosis not present

## 2017-12-09 DIAGNOSIS — D649 Anemia, unspecified: Secondary | ICD-10-CM | POA: Diagnosis not present

## 2017-12-09 DIAGNOSIS — E538 Deficiency of other specified B group vitamins: Secondary | ICD-10-CM | POA: Diagnosis not present

## 2017-12-09 DIAGNOSIS — M549 Dorsalgia, unspecified: Secondary | ICD-10-CM | POA: Diagnosis not present

## 2017-12-09 DIAGNOSIS — G4700 Insomnia, unspecified: Secondary | ICD-10-CM | POA: Diagnosis not present

## 2018-01-12 DIAGNOSIS — I2510 Atherosclerotic heart disease of native coronary artery without angina pectoris: Secondary | ICD-10-CM | POA: Diagnosis not present

## 2018-01-12 DIAGNOSIS — E538 Deficiency of other specified B group vitamins: Secondary | ICD-10-CM | POA: Diagnosis not present

## 2018-01-12 DIAGNOSIS — D649 Anemia, unspecified: Secondary | ICD-10-CM | POA: Diagnosis not present

## 2018-01-12 DIAGNOSIS — Z1331 Encounter for screening for depression: Secondary | ICD-10-CM | POA: Diagnosis not present

## 2018-02-11 DIAGNOSIS — E538 Deficiency of other specified B group vitamins: Secondary | ICD-10-CM | POA: Diagnosis not present

## 2018-02-11 DIAGNOSIS — E559 Vitamin D deficiency, unspecified: Secondary | ICD-10-CM | POA: Diagnosis not present

## 2018-02-11 DIAGNOSIS — E785 Hyperlipidemia, unspecified: Secondary | ICD-10-CM | POA: Diagnosis not present

## 2018-02-11 DIAGNOSIS — M549 Dorsalgia, unspecified: Secondary | ICD-10-CM | POA: Diagnosis not present

## 2018-02-11 DIAGNOSIS — K5900 Constipation, unspecified: Secondary | ICD-10-CM | POA: Diagnosis not present

## 2018-03-02 DIAGNOSIS — C3412 Malignant neoplasm of upper lobe, left bronchus or lung: Secondary | ICD-10-CM | POA: Diagnosis not present

## 2018-03-02 DIAGNOSIS — I313 Pericardial effusion (noninflammatory): Secondary | ICD-10-CM | POA: Diagnosis not present

## 2018-03-02 DIAGNOSIS — R911 Solitary pulmonary nodule: Secondary | ICD-10-CM | POA: Diagnosis not present

## 2018-03-04 DIAGNOSIS — R911 Solitary pulmonary nodule: Secondary | ICD-10-CM | POA: Diagnosis not present

## 2018-03-04 DIAGNOSIS — Z85118 Personal history of other malignant neoplasm of bronchus and lung: Secondary | ICD-10-CM | POA: Diagnosis not present

## 2018-03-04 DIAGNOSIS — Z923 Personal history of irradiation: Secondary | ICD-10-CM | POA: Diagnosis not present

## 2018-03-04 DIAGNOSIS — Z9221 Personal history of antineoplastic chemotherapy: Secondary | ICD-10-CM | POA: Diagnosis not present

## 2018-03-24 DIAGNOSIS — D649 Anemia, unspecified: Secondary | ICD-10-CM | POA: Diagnosis not present

## 2018-03-24 DIAGNOSIS — Z1231 Encounter for screening mammogram for malignant neoplasm of breast: Secondary | ICD-10-CM | POA: Diagnosis not present

## 2018-03-24 DIAGNOSIS — E538 Deficiency of other specified B group vitamins: Secondary | ICD-10-CM | POA: Diagnosis not present

## 2018-03-24 DIAGNOSIS — I2510 Atherosclerotic heart disease of native coronary artery without angina pectoris: Secondary | ICD-10-CM | POA: Diagnosis not present

## 2018-03-24 DIAGNOSIS — K253 Acute gastric ulcer without hemorrhage or perforation: Secondary | ICD-10-CM | POA: Diagnosis not present

## 2018-04-01 DIAGNOSIS — Z1231 Encounter for screening mammogram for malignant neoplasm of breast: Secondary | ICD-10-CM | POA: Diagnosis not present

## 2018-04-28 DIAGNOSIS — K253 Acute gastric ulcer without hemorrhage or perforation: Secondary | ICD-10-CM | POA: Diagnosis not present

## 2018-04-28 DIAGNOSIS — D649 Anemia, unspecified: Secondary | ICD-10-CM | POA: Diagnosis not present

## 2018-04-28 DIAGNOSIS — E785 Hyperlipidemia, unspecified: Secondary | ICD-10-CM | POA: Diagnosis not present

## 2018-04-28 DIAGNOSIS — E559 Vitamin D deficiency, unspecified: Secondary | ICD-10-CM | POA: Diagnosis not present

## 2018-04-28 DIAGNOSIS — I2510 Atherosclerotic heart disease of native coronary artery without angina pectoris: Secondary | ICD-10-CM | POA: Diagnosis not present

## 2018-04-28 DIAGNOSIS — E538 Deficiency of other specified B group vitamins: Secondary | ICD-10-CM | POA: Diagnosis not present

## 2018-05-21 ENCOUNTER — Ambulatory Visit (INDEPENDENT_AMBULATORY_CARE_PROVIDER_SITE_OTHER): Payer: Medicare Other | Admitting: Cardiology

## 2018-05-21 VITALS — BP 100/60 | HR 80 | Ht 65.0 in | Wt 153.8 lb

## 2018-05-21 DIAGNOSIS — I255 Ischemic cardiomyopathy: Secondary | ICD-10-CM

## 2018-05-21 DIAGNOSIS — E785 Hyperlipidemia, unspecified: Secondary | ICD-10-CM

## 2018-05-21 DIAGNOSIS — I2510 Atherosclerotic heart disease of native coronary artery without angina pectoris: Secondary | ICD-10-CM

## 2018-05-21 DIAGNOSIS — F17200 Nicotine dependence, unspecified, uncomplicated: Secondary | ICD-10-CM

## 2018-05-21 HISTORY — DX: Ischemic cardiomyopathy: I25.5

## 2018-05-21 LAB — BASIC METABOLIC PANEL WITH GFR
BUN/Creatinine Ratio: 8 — ABNORMAL LOW (ref 12–28)
BUN: 14 mg/dL (ref 8–27)
CO2: 25 mmol/L (ref 20–29)
Calcium: 9.6 mg/dL (ref 8.7–10.3)
Chloride: 98 mmol/L (ref 96–106)
Creatinine, Ser: 1.65 mg/dL — ABNORMAL HIGH (ref 0.57–1.00)
GFR calc Af Amer: 36 mL/min/1.73 — ABNORMAL LOW
GFR calc non Af Amer: 31 mL/min/1.73 — ABNORMAL LOW
Glucose: 90 mg/dL (ref 65–99)
Potassium: 5.4 mmol/L — ABNORMAL HIGH (ref 3.5–5.2)
Sodium: 138 mmol/L (ref 134–144)

## 2018-05-21 LAB — LIPID PANEL
Chol/HDL Ratio: 3.1 ratio (ref 0.0–4.4)
Cholesterol, Total: 146 mg/dL (ref 100–199)
HDL: 47 mg/dL (ref >39)
LDL CALC: 60 mg/dL (ref 0–99)
TRIGLYCERIDES: 194 mg/dL — AB (ref 0–149)
VLDL Cholesterol Cal: 39 mg/dL (ref 5–40)

## 2018-05-21 MED ORDER — METOPROLOL SUCCINATE ER 25 MG PO TB24: 25.0000 mg | ORAL_TABLET | Freq: Every day | ORAL | 3 refills | Status: DC

## 2018-05-21 NOTE — Progress Notes (Signed)
Cardiology Office Note:    Date:  05/21/2018   ID:  Ashley Miles, DOB November 11, 1945, MRN [id number]  PCP:  Ocie Doyne., MD  Cardiologist:  Jenne Campus, MD    Referring MD: Ocie Doyne., MD   Chief Complaint  Patient presents with  . Follow-up  Doing well  History of Present Illness:    Ashley Miles is a 72 y.o. female with coronary artery disease.  Also history of cardiomyopathy.  The biggest difficulty we have is to put on the right medication because of low blood pressure.  Overall she seems to be doing well described to have some shortness of breath with exertion as always but otherwise fine sadly still continue to smoke and smokes a lot.  Past Medical History:  Diagnosis Date  . COPD (chronic obstructive pulmonary disease) ([place])   . GERD (gastroesophageal reflux disease)   . Hyperlipidemia   . Hypertension   . Small cell lung cancer ([place])   . Tobacco use     Past Surgical History:  Procedure Laterality Date  . CESAREAN SECTION    . LEFT HEART CATH AND CORONARY ANGIOGRAPHY N/A 01/09/2017   Procedure: Left Heart Cath and Coronary Angiography;  Surgeon: Burnell Blanks, MD;  Location: [facility];  Service: Cardiovascular;  Laterality: N/A;  . LUNG CANCER SURGERY  2014    Current Medications: Current Meds  Medication Sig  . aspirin EC 81 MG EC tablet Take 1 tablet (81 mg total) by mouth daily.  Marland Kitchen buPROPion (WELLBUTRIN SR) 150 MG 12 hr tablet Take 150 mg by mouth 2 (two) times daily.  . clopidogrel (PLAVIX) 75 MG tablet Take 1 tablet (75 mg total) by mouth daily.  Marland Kitchen doxylamine, Sleep, (UNISOM) 25 MG tablet Take 25 mg by mouth at bedtime as needed.  . ezetimibe (ZETIA) 10 MG tablet Take 1 tablet (10 mg total) by mouth daily.  . Ferrous Sulfate (FEROSUL PO) Take 325 mg by mouth daily.  Marland Kitchen lubiprostone (AMITIZA) 24 MCG capsule Take 24 mcg by mouth 2 (two) times daily with a meal.  . metoprolol tartrate (LOPRESSOR) 25 MG tablet Take 0.5 tablets  (12.5 mg total) by mouth 2 (two) times daily. (Patient taking differently: Take 12.5 mg by mouth at bedtime. )  . morphine (MS CONTIN) 15 MG 12 hr tablet Take 15 mg by mouth 2 (two) times daily.  Marland Kitchen MOVANTIK 25 MG TABS tablet Take 25 mg by mouth daily.  . nitroGLYCERIN (NITROSTAT) 0.4 MG SL tablet Place 1 tablet (0.4 mg total) under the tongue every 5 (five) minutes x 3 doses as needed for chest pain.  Marland Kitchen ondansetron (ZOFRAN) 4 MG tablet Take 4 mg by mouth every 8 (eight) hours as needed for nausea or vomiting.  Marland Kitchen oxyCODONE-acetaminophen (PERCOCET) 10-325 MG tablet Take 1 tablet by mouth every 6 (six) hours as needed for pain.  . polyethylene glycol (MIRALAX / GLYCOLAX) packet Take 17 g by mouth daily.  . potassium chloride (K-DUR) 10 MEQ tablet Take 10 mEq by mouth daily.  . prochlorperazine (COMPAZINE) 10 MG tablet Take 10 mg by mouth 3 (three) times daily as needed for nausea/vomiting.  . rosuvastatin (CRESTOR) 10 MG tablet Take 1 tablet by mouth daily.  . Vitamin D, Ergocalciferol, (DRISDOL) 50000 units CAPS capsule Take 50,000 Units by mouth every 7 (seven) days.     Allergies:   Patient has no known allergies.   Social History   Socioeconomic History  . Marital status: Divorced  Spouse name: Not on file  . Number of children: Not on file  . Years of education: Not on file  . Highest education level: Not on file  Occupational History  . Not on file  Social Needs  . Financial resource strain: Not on file  . Food insecurity:    Worry: Not on file    Inability: Not on file  . Transportation needs:    Medical: Not on file    Non-medical: Not on file  Tobacco Use  . Smoking status: Current Every Day Smoker    Types: Cigarettes  . Smokeless tobacco: Never Used  Substance and Sexual Activity  . Alcohol use: No  . Drug use: No  . Sexual activity: Not on file  Lifestyle  . Physical activity:    Days per week: Not on file    Minutes per session: Not on file  . Stress: Not on  file  Relationships  . Social connections:    Talks on phone: Not on file    Gets together: Not on file    Attends religious service: Not on file    Active member of club or organization: Not on file    Attends meetings of clubs or organizations: Not on file    Relationship status: Not on file  Other Topics Concern  . Not on file  Social History Narrative  . Not on file     Family History: The patient's family history includes Blindness in her mother; Cancer in her father; Hypertension in her father. ROS:   Please see the history of present illness.    All 14 point review of systems negative except as described per history of present illness  EKGs/Labs/Other Studies Reviewed:      Recent Labs: 05/28/2017: BUN 10; Creatinine, Ser 1.20; Potassium 4.9; Sodium 139  Recent Lipid Panel    Component Value Date/Time   CHOL 203 (H) 04/03/2017 1651   TRIG 115 04/03/2017 1651   HDL 54 04/03/2017 1651   CHOLHDL 3.8 04/03/2017 1651   CHOLHDL 5.0 01/11/2017 0521   VLDL 27 01/11/2017 0521   LDLCALC 126 (H) 04/03/2017 1651    Physical Exam:    VS:  BP 100/60   Pulse 80   Ht 5\' 5"  (1.651 m)   Wt 153 lb 12.8 oz (69.8 kg)   SpO2 93%   BMI 25.59 kg/m     Wt Readings from Last 3 Encounters:  05/21/18 153 lb 12.8 oz (69.8 kg)  11/20/17 148 lb 12.8 oz (67.5 kg)  05/28/17 142 lb 1.9 oz (64.5 kg)     GEN:  Well nourished, well developed in no acute distress HEENT: Normal NECK: No JVD; No carotid bruits LYMPHATICS: No lymphadenopathy CARDIAC: RRR, no murmurs, no rubs, no gallops RESPIRATORY:  Clear to auscultation without rales, wheezing or rhonchi  ABDOMEN: Soft, non-tender, non-distended MUSCULOSKELETAL:  No edema; No deformity  SKIN: Warm and dry LOWER EXTREMITIES: no swelling NEUROLOGIC:  Alert and oriented x 3 PSYCHIATRIC:  Normal affect   ASSESSMENT:    1. Coronary artery disease involving native coronary artery of native heart without angina pectoris   2.  Dyslipidemia   3. Smoking   4. Ischemic cardiomyopathy    PLAN:    In order of problems listed above:  1. Coronary artery disease.  Stable no chest pain tightness squeezing pressure branches on appropriate medications which I will continue that includes dual antiplatelet therapy which I will continue most likely indefinitely because of her multiple  risk factors that I am possible to modified. 2. History of cardiomyopathy I will ask her to have echocardiogram done to recheck left ventricular ejection fraction.  I will also switch her from short acting metoprolol to long-acting metoprolol so she will be taking metoprolol succinate 25 mg daily.  I will check her Chem-7 to see if I will be able to put her on small dose of Entresto. 3. Smoking obviously a problem we had a long discussion about this I strongly recommend to stop.  She told me straight that she most likely would not be able to do it 4. Ischemic cardia myopathy again echocardiogram will be done to assess the situation.   Medication Adjustments/Labs and Tests Ordered: Current medicines are reviewed at length with the patient today.  Concerns regarding medicines are outlined above.  No orders of the defined types were placed in this encounter.  Medication changes: No orders of the defined types were placed in this encounter.   Signed, Park Liter, MD, [facility] 05/21/2018 11:11 AM    [place]

## 2018-05-21 NOTE — Patient Instructions (Addendum)
Medication Instructions:  Your physician has recommended you make the following change in your medication:   START:  Metoprolol succinate 25 mg daily  STOP: Metoprolol tartrate  If you need a refill on your cardiac medications before your next appointment, please call your pharmacy.   Lab work: Your physician recommends that you return for lab work today: Lipids, and bmp   If you have labs (blood work) drawn today and your tests are completely normal, you will receive your results only by: Marland Kitchen MyChart Message (if you have MyChart) OR . A paper copy in the mail If you have any lab test that is abnormal or we need to change your treatment, we will call you to review the results.  Testing/Procedures: Your physician has requested that you have an echocardiogram. Echocardiography is a painless test that uses sound waves to create images of your heart. It provides your doctor with information about the size and shape of your heart and how well your heart's chambers and valves are working. This procedure takes approximately one hour. There are no restrictions for this procedure.    Follow-Up: At [facility], you and your health needs are our priority.  As part of our continuing mission to provide you with exceptional heart care, we have created designated Provider Care Teams.  These Care Teams include your primary Cardiologist (physician) and Advanced Practice Providers (APPs -  Physician Assistants and Nurse Practitioners) who all work together to provide you with the care you need, when you need it. You will need a follow up appointment in 3 months.  Please call our office 2 months in advance to schedule this appointment.  You may see No primary care provider on file. or another member of our [employer] Provider Team in [place]: Shirlee More, MD . Jyl Heinz, MD  Any Other Special Instructions Will Be Listed Below (If Applicable).  Metoprolol extended-release tablets What is this  medicine? METOPROLOL (me TOE proe lole) is a beta-blocker. Beta-blockers reduce the workload on the heart and help it to beat more regularly. This medicine is used to treat high blood pressure and to prevent chest pain. It is also used to after a heart attack and to prevent an additional heart attack from occurring. This medicine may be used for other purposes; ask your health care provider or pharmacist if you have questions. COMMON BRAND NAME(S): toprol, Toprol XL What should I tell my health care provider before I take this medicine? They need to know if you have any of these conditions: -diabetes -heart or vessel disease like slow heart rate, worsening heart failure, heart block, sick sinus syndrome or Raynaud's disease -kidney disease -liver disease -lung or breathing disease, like asthma or emphysema -pheochromocytoma -thyroid disease -an unusual or allergic reaction to metoprolol, other beta-blockers, medicines, foods, dyes, or preservatives -pregnant or trying to get pregnant -breast-feeding How should I use this medicine? Take this medicine by mouth with a glass of water. Follow the directions on the prescription label. Do not crush or chew. Take this medicine with or immediately after meals. Take your doses at regular intervals. Do not take more medicine than directed. Do not stop taking this medicine suddenly. This could lead to serious heart-related effects. Talk to your pediatrician regarding the use of this medicine in children. While this drug may be prescribed for children as young as 6 years for selected conditions, precautions do apply. Overdosage: If you think you have taken too much of this medicine contact a poison  control center or emergency room at once. NOTE: This medicine is only for you. Do not share this medicine with others. What if I miss a dose? If you miss a dose, take it as soon as you can. If it is almost time for your next dose, take only that dose. Do not take  double or extra doses. What may interact with this medicine? This medicine may interact with the following medications: -certain medicines for blood pressure, heart disease, irregular heart beat -certain medicines for depression, like monoamine oxidase (MAO) inhibitors, fluoxetine, or paroxetine -clonidine -dobutamine -epinephrine -isoproterenol -reserpine This list may not describe all possible interactions. Give your health care provider a list of all the medicines, herbs, non-prescription drugs, or dietary supplements you use. Also tell them if you smoke, drink alcohol, or use illegal drugs. Some items may interact with your medicine. What should I watch for while using this medicine? Visit your doctor or health care professional for regular check ups. Contact your doctor right away if your symptoms worsen. Check your blood pressure and pulse rate regularly. Ask your health care professional what your blood pressure and pulse rate should be, and when you should contact them. You may get drowsy or dizzy. Do not drive, use machinery, or do anything that needs mental alertness until you know how this medicine affects you. Do not sit or stand up quickly, especially if you are an older patient. This reduces the risk of dizzy or fainting spells. Contact your doctor if these symptoms continue. Alcohol may interfere with the effect of this medicine. Avoid alcoholic drinks. What side effects may I notice from receiving this medicine? Side effects that you should report to your doctor or health care professional as soon as possible: -allergic reactions like skin rash, itching or hives -cold or numb hands or feet -depression -difficulty breathing -faint -fever with sore throat -irregular heartbeat, chest pain -rapid weight gain -swollen legs or ankles Side effects that usually do not require medical attention (report to your doctor or health care professional if they continue or are  bothersome): -anxiety or nervousness -change in sex drive or performance -dry skin -headache -nightmares or trouble sleeping -short term memory loss -stomach upset or diarrhea -unusually tired This list may not describe all possible side effects. Call your doctor for medical advice about side effects. You may report side effects to FDA at 1-800-FDA-1088. Where should I keep my medicine? Keep out of the reach of children. Store at room temperature between 15 and 30 degrees C (59 and 86 degrees F). Throw away any unused medicine after the expiration date. NOTE: This sheet is a summary. It may not cover all possible information. If you have questions about this medicine, talk to your doctor, pharmacist, or health care provider.  2018 Elsevier/Gold Standard (2013-03-26 14:41:37)

## 2018-05-28 DIAGNOSIS — E785 Hyperlipidemia, unspecified: Secondary | ICD-10-CM | POA: Diagnosis not present

## 2018-05-28 DIAGNOSIS — I2510 Atherosclerotic heart disease of native coronary artery without angina pectoris: Secondary | ICD-10-CM | POA: Diagnosis not present

## 2018-05-28 DIAGNOSIS — E538 Deficiency of other specified B group vitamins: Secondary | ICD-10-CM | POA: Diagnosis not present

## 2018-05-28 DIAGNOSIS — M549 Dorsalgia, unspecified: Secondary | ICD-10-CM | POA: Diagnosis not present

## 2018-05-28 DIAGNOSIS — C3490 Malignant neoplasm of unspecified part of unspecified bronchus or lung: Secondary | ICD-10-CM | POA: Diagnosis not present

## 2018-06-29 DIAGNOSIS — E559 Vitamin D deficiency, unspecified: Secondary | ICD-10-CM | POA: Diagnosis not present

## 2018-06-29 DIAGNOSIS — I2510 Atherosclerotic heart disease of native coronary artery without angina pectoris: Secondary | ICD-10-CM | POA: Diagnosis not present

## 2018-06-29 DIAGNOSIS — D649 Anemia, unspecified: Secondary | ICD-10-CM | POA: Diagnosis not present

## 2018-06-29 DIAGNOSIS — K922 Gastrointestinal hemorrhage, unspecified: Secondary | ICD-10-CM | POA: Diagnosis not present

## 2018-06-29 DIAGNOSIS — E538 Deficiency of other specified B group vitamins: Secondary | ICD-10-CM | POA: Diagnosis not present

## 2018-07-13 ENCOUNTER — Other Ambulatory Visit: Payer: Medicare Other

## 2018-08-13 DIAGNOSIS — E538 Deficiency of other specified B group vitamins: Secondary | ICD-10-CM | POA: Diagnosis not present

## 2018-08-13 DIAGNOSIS — M549 Dorsalgia, unspecified: Secondary | ICD-10-CM | POA: Diagnosis not present

## 2018-08-13 DIAGNOSIS — D649 Anemia, unspecified: Secondary | ICD-10-CM | POA: Diagnosis not present

## 2018-08-13 DIAGNOSIS — I2510 Atherosclerotic heart disease of native coronary artery without angina pectoris: Secondary | ICD-10-CM | POA: Diagnosis not present

## 2018-08-13 DIAGNOSIS — E785 Hyperlipidemia, unspecified: Secondary | ICD-10-CM | POA: Diagnosis not present

## 2018-08-24 ENCOUNTER — Ambulatory Visit: Payer: Medicare Other | Admitting: Cardiology

## 2018-09-03 DIAGNOSIS — G8929 Other chronic pain: Secondary | ICD-10-CM

## 2018-09-03 DIAGNOSIS — Z85118 Personal history of other malignant neoplasm of bronchus and lung: Secondary | ICD-10-CM | POA: Diagnosis not present

## 2018-09-03 DIAGNOSIS — Z9221 Personal history of antineoplastic chemotherapy: Secondary | ICD-10-CM | POA: Diagnosis not present

## 2018-09-03 DIAGNOSIS — Z923 Personal history of irradiation: Secondary | ICD-10-CM | POA: Diagnosis not present

## 2018-09-03 DIAGNOSIS — M1990 Unspecified osteoarthritis, unspecified site: Secondary | ICD-10-CM | POA: Diagnosis not present

## 2018-09-03 DIAGNOSIS — R918 Other nonspecific abnormal finding of lung field: Secondary | ICD-10-CM | POA: Diagnosis not present

## 2018-09-14 ENCOUNTER — Encounter: Payer: Self-pay | Admitting: Cardiology

## 2018-09-14 ENCOUNTER — Ambulatory Visit (INDEPENDENT_AMBULATORY_CARE_PROVIDER_SITE_OTHER): Payer: Medicare Other | Admitting: Cardiology

## 2018-09-14 VITALS — BP 128/68 | HR 70 | Ht 65.0 in | Wt 156.4 lb

## 2018-09-14 DIAGNOSIS — I255 Ischemic cardiomyopathy: Secondary | ICD-10-CM

## 2018-09-14 DIAGNOSIS — I2510 Atherosclerotic heart disease of native coronary artery without angina pectoris: Secondary | ICD-10-CM | POA: Diagnosis not present

## 2018-09-14 DIAGNOSIS — F17200 Nicotine dependence, unspecified, uncomplicated: Secondary | ICD-10-CM

## 2018-09-14 DIAGNOSIS — E785 Hyperlipidemia, unspecified: Secondary | ICD-10-CM

## 2018-09-14 MED ORDER — NITROGLYCERIN 0.4 MG SL SUBL: 0.4000 mg | SUBLINGUAL_TABLET | SUBLINGUAL | 12 refills | Status: DC | PRN

## 2018-09-14 MED ORDER — NITROGLYCERIN 0.4 MG SL SUBL: 0.4000 mg | SUBLINGUAL_TABLET | SUBLINGUAL | 12 refills | Status: AC | PRN

## 2018-09-14 NOTE — Patient Instructions (Addendum)
Medication Instructions:  Your physician recommends that you continue on your current medications as directed. Please refer to the Current Medication list given to you today.   If you need a refill on your cardiac medications before your next appointment, please call your pharmacy.   Lab work: Your physician recommends that you return for lab work today: bmp, lipids   If you have labs (blood work) drawn today and your tests are completely normal, you will receive your results only by: Marland Kitchen MyChart Message (if you have MyChart) OR . A paper copy in the mail If you have any lab test that is abnormal or we need to change your treatment, we will call you to review the results.  Testing/Procedures: Your physician has requested that you have an echocardiogram. Echocardiography is a painless test that uses sound waves to create images of your heart. It provides your doctor with information about the size and shape of your heart and how well your heart's chambers and valves are working. This procedure takes approximately one hour. There are no restrictions for this procedure.    Follow-Up: At [facility], you and your health needs are our priority.  As part of our continuing mission to provide you with exceptional heart care, we have created designated Provider Care Teams.  These Care Teams include your primary Cardiologist (physician) and Advanced Practice Providers (APPs -  Physician Assistants and Nurse Practitioners) who all work together to provide you with the care you need, when you need it. You will need a follow up appointment in 3 months.  Please call our office 2 months in advance to schedule this appointment.  You may see No primary care provider on file. or another member of our [employer] Provider Team in [place]: Shirlee More, MD . Jyl Heinz, MD  Any Other Special Instructions Will Be Listed Below (If Applicable).   Echocardiogram An echocardiogram is a procedure that  uses painless sound waves (ultrasound) to produce an image of the heart. Images from an echocardiogram can provide important information about:  Signs of coronary artery disease (CAD).  Aneurysm detection. An aneurysm is a weak or damaged part of an artery wall that bulges out from the normal force of blood pumping through the body.  Heart size and shape. Changes in the size or shape of the heart can be associated with certain conditions, including heart failure, aneurysm, and CAD.  Heart muscle function.  Heart valve function.  Signs of a past heart attack.  Fluid buildup around the heart.  Thickening of the heart muscle.  A tumor or infectious growth around the heart valves. Tell a health care provider about:  Any allergies you have.  All medicines you are taking, including vitamins, herbs, eye drops, creams, and over-the-counter medicines.  Any blood disorders you have.  Any surgeries you have had.  Any medical conditions you have.  Whether you are pregnant or may be pregnant. What are the risks? Generally, this is a safe procedure. However, problems may occur, including:  Allergic reaction to dye (contrast) that may be used during the procedure. What happens before the procedure? No specific preparation is needed. You may eat and drink normally. What happens during the procedure?   An IV tube may be inserted into one of your veins.  You may receive contrast through this tube. A contrast is an injection that improves the quality of the pictures from your heart.  A gel will be applied to your chest.  A wand-like  tool (transducer) will be moved over your chest. The gel will help to transmit the sound waves from the transducer.  The sound waves will harmlessly bounce off of your heart to allow the heart images to be captured in real-time motion. The images will be recorded on a computer. The procedure may vary among health care providers and hospitals. What happens  after the procedure?  You may return to your normal, everyday life, including diet, activities, and medicines, unless your health care provider tells you not to do that. Summary  An echocardiogram is a procedure that uses painless sound waves (ultrasound) to produce an image of the heart.  Images from an echocardiogram can provide important information about the size and shape of your heart, heart muscle function, heart valve function, and fluid buildup around your heart.  You do not need to do anything to prepare before this procedure. You may eat and drink normally.  After the echocardiogram is completed, you may return to your normal, everyday life, unless your health care provider tells you not to do that. This information is not intended to replace advice given to you by your health care provider. Make sure you discuss any questions you have with your health care provider. Document Released: 07/19/2000 Document Revised: 08/24/2016 Document Reviewed: 08/24/2016 Elsevier Interactive Patient Education  2019 [employer].

## 2018-09-14 NOTE — Progress Notes (Signed)
Cardiology Office Note:    Date:  09/14/2018   ID:  Ashley Miles, [name] 1945-12-21, MRN [id number]  PCP:  Ocie Doyne., MD  Cardiologist:  Jenne Campus, MD    Referring MD: Ocie Doyne., MD   Chief Complaint  Patient presents with  . Follow-up  Doing well  History of Present Illness:    Ashley Miles is a 73 y.o. female with coronary artery disease status post non-STEMI.  Cardiomyopathy, noncompliance comes today to our office for follow-up overall she is doing well still continue to smoke 1 to 2 packs/day no chest pain tightness squeezing pressure burning chest but does have exertional shortness of breath overall she tells me that she is doing well.  She said her cancer is stable.  Past Medical History:  Diagnosis Date  . COPD (chronic obstructive pulmonary disease) ([place])   . GERD (gastroesophageal reflux disease)   . Hyperlipidemia   . Hypertension   . Small cell lung cancer ([place])   . Tobacco use     Past Surgical History:  Procedure Laterality Date  . CESAREAN SECTION    . LEFT HEART CATH AND CORONARY ANGIOGRAPHY N/A 01/09/2017   Procedure: Left Heart Cath and Coronary Angiography;  Surgeon: Burnell Blanks, MD;  Location: [facility];  Service: Cardiovascular;  Laterality: N/A;  . LUNG CANCER SURGERY  2014    Current Medications: Current Meds  Medication Sig  . aspirin EC 81 MG EC tablet Take 1 tablet (81 mg total) by mouth daily.  Marland Kitchen buPROPion (WELLBUTRIN SR) 150 MG 12 hr tablet Take 150 mg by mouth 2 (two) times daily.  . clopidogrel (PLAVIX) 75 MG tablet Take 1 tablet (75 mg total) by mouth daily.  Marland Kitchen doxylamine, Sleep, (UNISOM) 25 MG tablet Take 25 mg by mouth at bedtime as needed.  . ezetimibe (ZETIA) 10 MG tablet Take 1 tablet (10 mg total) by mouth daily.  . Ferrous Sulfate (FEROSUL PO) Take 325 mg by mouth daily.  Marland Kitchen lubiprostone (AMITIZA) 24 MCG capsule Take 24 mcg by mouth 2 (two) times daily with a meal.  . metoprolol succinate  (TOPROL-XL) 25 MG 24 hr tablet Take 1 tablet (25 mg total) by mouth daily.  Marland Kitchen morphine (MS CONTIN) 15 MG 12 hr tablet Take 15 mg by mouth 2 (two) times daily.  Marland Kitchen MOVANTIK 25 MG TABS tablet Take 25 mg by mouth daily.  . nitroGLYCERIN (NITROSTAT) 0.4 MG SL tablet Place 1 tablet (0.4 mg total) under the tongue every 5 (five) minutes x 3 doses as needed for chest pain.  Marland Kitchen ondansetron (ZOFRAN) 4 MG tablet Take 4 mg by mouth every 8 (eight) hours as needed for nausea or vomiting.  Marland Kitchen oxyCODONE-acetaminophen (PERCOCET) 10-325 MG tablet Take 1 tablet by mouth every 6 (six) hours as needed for pain.  . polyethylene glycol (MIRALAX / GLYCOLAX) packet Take 17 g by mouth daily.  . potassium chloride (K-DUR) 10 MEQ tablet Take 10 mEq by mouth daily.  . prochlorperazine (COMPAZINE) 10 MG tablet Take 10 mg by mouth 3 (three) times daily as needed for nausea/vomiting.  . rosuvastatin (CRESTOR) 10 MG tablet Take 1 tablet by mouth daily.  . Vitamin D, Ergocalciferol, (DRISDOL) 50000 units CAPS capsule Take 50,000 Units by mouth every 7 (seven) days.     Allergies:   Patient has no known allergies.   Social History   Socioeconomic History  . Marital status: Divorced    Spouse name: Not on file  .  Number of children: Not on file  . Years of education: Not on file  . Highest education level: Not on file  Occupational History  . Not on file  Social Needs  . Financial resource strain: Not on file  . Food insecurity:    Worry: Not on file    Inability: Not on file  . Transportation needs:    Medical: Not on file    Non-medical: Not on file  Tobacco Use  . Smoking status: Current Every Day Smoker    Types: Cigarettes  . Smokeless tobacco: Never Used  Substance and Sexual Activity  . Alcohol use: No  . Drug use: No  . Sexual activity: Not on file  Lifestyle  . Physical activity:    Days per week: Not on file    Minutes per session: Not on file  . Stress: Not on file  Relationships  . Social  connections:    Talks on phone: Not on file    Gets together: Not on file    Attends religious service: Not on file    Active member of club or organization: Not on file    Attends meetings of clubs or organizations: Not on file    Relationship status: Not on file  Other Topics Concern  . Not on file  Social History Narrative  . Not on file     Family History: The patient's family history includes Blindness in her mother; Cancer in her father; Hypertension in her father. ROS:   Please see the history of present illness.    All 14 point review of systems negative except as described per history of present illness  EKGs/Labs/Other Studies Reviewed:      Recent Labs: 05/21/2018: BUN 14; Creatinine, Ser 1.65; Potassium 5.4; Sodium 138  Recent Lipid Panel    Component Value Date/Time   CHOL 146 05/21/2018 1134   TRIG 194 (H) 05/21/2018 1134   HDL 47 05/21/2018 1134   CHOLHDL 3.1 05/21/2018 1134   CHOLHDL 5.0 01/11/2017 0521   VLDL 27 01/11/2017 0521   LDLCALC 60 05/21/2018 1134    Physical Exam:    VS:  BP 128/68   Pulse 70   Ht 5\' 5"  (1.651 m)   Wt 156 lb 6.4 oz (70.9 kg)   SpO2 96%   BMI 26.03 kg/m     Wt Readings from Last 3 Encounters:  09/14/18 156 lb 6.4 oz (70.9 kg)  05/21/18 153 lb 12.8 oz (69.8 kg)  11/20/17 148 lb 12.8 oz (67.5 kg)     GEN:  Well nourished, well developed in no acute distress HEENT: Normal NECK: No JVD; No carotid bruits LYMPHATICS: No lymphadenopathy CARDIAC: RRR, no murmurs, no rubs, no gallops RESPIRATORY:  Clear to auscultation without rales, wheezing or rhonchi  ABDOMEN: Soft, non-tender, non-distended MUSCULOSKELETAL:  No edema; No deformity  SKIN: Warm and dry LOWER EXTREMITIES: no swelling NEUROLOGIC:  Alert and oriented x 3 PSYCHIATRIC:  Normal affect   ASSESSMENT:    1. Ischemic cardiomyopathy   2. Smoking   3. Dyslipidemia   4. Coronary artery disease involving native coronary artery of native heart without angina  pectoris    PLAN:    In order of problems listed above:  1. Ischemic cardiomyopathy on small dose of beta-blocker which I will continue.  I will ask you to have Chem-7 done if Chem-7 is fine then we will add ACE inhibitor.  It is also time to recheck her cardiomyopathy so far she missed  2 appointment for her echocardiogram. 2. Smoking she smokes 1 to 2 pack/day and we had a long discussion about this and strongly recommend to quit. 3. Dyslipidemia we will check her fasting lipid profile today. 4. Coronary artery disease seems to be asymptomatic we will give prescription for nitroglycerin will continue dual antiplatelet therapy.   Medication Adjustments/Labs and Tests Ordered: Current medicines are reviewed at length with the patient today.  Concerns regarding medicines are outlined above.  No orders of the defined types were placed in this encounter.  Medication changes: No orders of the defined types were placed in this encounter.   Signed, Park Liter, MD, [facility] 09/14/2018 1:44 PM    [place]

## 2018-09-15 DIAGNOSIS — G4700 Insomnia, unspecified: Secondary | ICD-10-CM | POA: Diagnosis not present

## 2018-09-15 DIAGNOSIS — K5900 Constipation, unspecified: Secondary | ICD-10-CM | POA: Diagnosis not present

## 2018-09-15 DIAGNOSIS — E785 Hyperlipidemia, unspecified: Secondary | ICD-10-CM | POA: Diagnosis not present

## 2018-09-15 DIAGNOSIS — K922 Gastrointestinal hemorrhage, unspecified: Secondary | ICD-10-CM | POA: Diagnosis not present

## 2018-09-15 DIAGNOSIS — E538 Deficiency of other specified B group vitamins: Secondary | ICD-10-CM | POA: Diagnosis not present

## 2018-09-15 DIAGNOSIS — I2510 Atherosclerotic heart disease of native coronary artery without angina pectoris: Secondary | ICD-10-CM | POA: Diagnosis not present

## 2018-09-15 DIAGNOSIS — R739 Hyperglycemia, unspecified: Secondary | ICD-10-CM | POA: Diagnosis not present

## 2018-09-16 ENCOUNTER — Encounter: Payer: Self-pay | Admitting: Cardiology

## 2018-09-17 ENCOUNTER — Ambulatory Visit: Payer: Medicare Other | Admitting: Cardiology

## 2018-10-15 ENCOUNTER — Ambulatory Visit (INDEPENDENT_AMBULATORY_CARE_PROVIDER_SITE_OTHER): Payer: Medicare Other

## 2018-10-15 ENCOUNTER — Other Ambulatory Visit: Payer: Self-pay

## 2018-10-15 DIAGNOSIS — I255 Ischemic cardiomyopathy: Secondary | ICD-10-CM

## 2018-10-15 NOTE — Progress Notes (Signed)
Complete echocardiogram has been performed.  Jimmy Jr Milliron RDCS, RVT 

## 2018-10-19 DIAGNOSIS — D649 Anemia, unspecified: Secondary | ICD-10-CM | POA: Diagnosis not present

## 2018-10-19 DIAGNOSIS — E559 Vitamin D deficiency, unspecified: Secondary | ICD-10-CM | POA: Diagnosis not present

## 2018-10-19 DIAGNOSIS — I2510 Atherosclerotic heart disease of native coronary artery without angina pectoris: Secondary | ICD-10-CM | POA: Diagnosis not present

## 2018-10-19 DIAGNOSIS — K922 Gastrointestinal hemorrhage, unspecified: Secondary | ICD-10-CM | POA: Diagnosis not present

## 2018-10-19 DIAGNOSIS — E538 Deficiency of other specified B group vitamins: Secondary | ICD-10-CM | POA: Diagnosis not present

## 2018-11-17 DIAGNOSIS — E559 Vitamin D deficiency, unspecified: Secondary | ICD-10-CM | POA: Diagnosis not present

## 2018-11-17 DIAGNOSIS — K5900 Constipation, unspecified: Secondary | ICD-10-CM | POA: Diagnosis not present

## 2018-11-17 DIAGNOSIS — I2510 Atherosclerotic heart disease of native coronary artery without angina pectoris: Secondary | ICD-10-CM | POA: Diagnosis not present

## 2018-11-17 DIAGNOSIS — K922 Gastrointestinal hemorrhage, unspecified: Secondary | ICD-10-CM | POA: Diagnosis not present

## 2018-11-17 DIAGNOSIS — D649 Anemia, unspecified: Secondary | ICD-10-CM | POA: Diagnosis not present

## 2018-12-14 ENCOUNTER — Other Ambulatory Visit: Payer: Self-pay

## 2018-12-14 ENCOUNTER — Telehealth: Payer: Medicare Other | Admitting: Cardiology

## 2019-02-04 DIAGNOSIS — E538 Deficiency of other specified B group vitamins: Secondary | ICD-10-CM | POA: Diagnosis not present

## 2019-02-04 DIAGNOSIS — M7989 Other specified soft tissue disorders: Secondary | ICD-10-CM | POA: Diagnosis not present

## 2019-02-04 DIAGNOSIS — R0602 Shortness of breath: Secondary | ICD-10-CM | POA: Diagnosis not present

## 2019-02-04 DIAGNOSIS — D649 Anemia, unspecified: Secondary | ICD-10-CM | POA: Diagnosis not present

## 2019-02-04 DIAGNOSIS — E785 Hyperlipidemia, unspecified: Secondary | ICD-10-CM | POA: Diagnosis not present

## 2019-02-04 DIAGNOSIS — K922 Gastrointestinal hemorrhage, unspecified: Secondary | ICD-10-CM | POA: Diagnosis not present

## 2019-02-04 DIAGNOSIS — G4700 Insomnia, unspecified: Secondary | ICD-10-CM | POA: Diagnosis not present

## 2019-02-05 DIAGNOSIS — M7989 Other specified soft tissue disorders: Secondary | ICD-10-CM | POA: Diagnosis not present

## 2019-02-05 DIAGNOSIS — R791 Abnormal coagulation profile: Secondary | ICD-10-CM | POA: Diagnosis not present

## 2019-02-05 DIAGNOSIS — R7989 Other specified abnormal findings of blood chemistry: Secondary | ICD-10-CM | POA: Diagnosis not present

## 2019-03-09 DIAGNOSIS — E538 Deficiency of other specified B group vitamins: Secondary | ICD-10-CM | POA: Diagnosis not present

## 2019-03-09 DIAGNOSIS — E559 Vitamin D deficiency, unspecified: Secondary | ICD-10-CM | POA: Diagnosis not present

## 2019-03-09 DIAGNOSIS — Z9181 History of falling: Secondary | ICD-10-CM | POA: Diagnosis not present

## 2019-03-09 DIAGNOSIS — Z1231 Encounter for screening mammogram for malignant neoplasm of breast: Secondary | ICD-10-CM | POA: Diagnosis not present

## 2019-03-11 DIAGNOSIS — T50995A Adverse effect of other drugs, medicaments and biological substances, initial encounter: Secondary | ICD-10-CM | POA: Diagnosis not present

## 2019-03-11 DIAGNOSIS — J439 Emphysema, unspecified: Secondary | ICD-10-CM | POA: Diagnosis not present

## 2019-03-11 DIAGNOSIS — I700 Atherosclerosis of aorta: Secondary | ICD-10-CM | POA: Diagnosis not present

## 2019-03-11 DIAGNOSIS — J703 Chronic drug-induced interstitial lung disorders: Secondary | ICD-10-CM | POA: Diagnosis not present

## 2019-03-11 DIAGNOSIS — I313 Pericardial effusion (noninflammatory): Secondary | ICD-10-CM | POA: Diagnosis not present

## 2019-03-11 DIAGNOSIS — C3412 Malignant neoplasm of upper lobe, left bronchus or lung: Secondary | ICD-10-CM | POA: Diagnosis not present

## 2019-03-19 DIAGNOSIS — R1084 Generalized abdominal pain: Secondary | ICD-10-CM | POA: Diagnosis not present

## 2019-03-19 DIAGNOSIS — R112 Nausea with vomiting, unspecified: Secondary | ICD-10-CM | POA: Diagnosis not present

## 2019-03-19 DIAGNOSIS — R109 Unspecified abdominal pain: Secondary | ICD-10-CM | POA: Diagnosis not present

## 2019-03-26 DIAGNOSIS — M8589 Other specified disorders of bone density and structure, multiple sites: Secondary | ICD-10-CM | POA: Diagnosis not present

## 2019-03-26 DIAGNOSIS — C3412 Malignant neoplasm of upper lobe, left bronchus or lung: Secondary | ICD-10-CM | POA: Diagnosis not present

## 2019-03-26 DIAGNOSIS — Z85048 Personal history of other malignant neoplasm of rectum, rectosigmoid junction, and anus: Secondary | ICD-10-CM | POA: Diagnosis not present

## 2019-03-26 DIAGNOSIS — C3410 Malignant neoplasm of upper lobe, unspecified bronchus or lung: Secondary | ICD-10-CM | POA: Diagnosis not present

## 2019-03-26 DIAGNOSIS — D630 Anemia in neoplastic disease: Secondary | ICD-10-CM | POA: Diagnosis not present

## 2019-04-13 DIAGNOSIS — D649 Anemia, unspecified: Secondary | ICD-10-CM | POA: Diagnosis not present

## 2019-04-13 DIAGNOSIS — R42 Dizziness and giddiness: Secondary | ICD-10-CM | POA: Diagnosis not present

## 2019-04-13 DIAGNOSIS — Z7409 Other reduced mobility: Secondary | ICD-10-CM | POA: Diagnosis not present

## 2019-04-13 DIAGNOSIS — I2510 Atherosclerotic heart disease of native coronary artery without angina pectoris: Secondary | ICD-10-CM | POA: Diagnosis not present

## 2019-04-13 DIAGNOSIS — E538 Deficiency of other specified B group vitamins: Secondary | ICD-10-CM | POA: Diagnosis not present

## 2019-04-19 DIAGNOSIS — R5383 Other fatigue: Secondary | ICD-10-CM | POA: Diagnosis not present

## 2019-04-19 DIAGNOSIS — R2689 Other abnormalities of gait and mobility: Secondary | ICD-10-CM | POA: Diagnosis not present

## 2019-04-19 DIAGNOSIS — R2681 Unsteadiness on feet: Secondary | ICD-10-CM | POA: Diagnosis not present

## 2019-04-19 DIAGNOSIS — M6281 Muscle weakness (generalized): Secondary | ICD-10-CM | POA: Diagnosis not present

## 2019-04-19 DIAGNOSIS — Z7409 Other reduced mobility: Secondary | ICD-10-CM | POA: Diagnosis not present

## 2019-04-22 DIAGNOSIS — M6281 Muscle weakness (generalized): Secondary | ICD-10-CM | POA: Diagnosis not present

## 2019-04-22 DIAGNOSIS — R2689 Other abnormalities of gait and mobility: Secondary | ICD-10-CM | POA: Diagnosis not present

## 2019-04-22 DIAGNOSIS — Z7409 Other reduced mobility: Secondary | ICD-10-CM | POA: Diagnosis not present

## 2019-04-22 DIAGNOSIS — R5383 Other fatigue: Secondary | ICD-10-CM | POA: Diagnosis not present

## 2019-04-22 DIAGNOSIS — R2681 Unsteadiness on feet: Secondary | ICD-10-CM | POA: Diagnosis not present

## 2019-04-27 DIAGNOSIS — R2689 Other abnormalities of gait and mobility: Secondary | ICD-10-CM | POA: Diagnosis not present

## 2019-04-27 DIAGNOSIS — R2681 Unsteadiness on feet: Secondary | ICD-10-CM | POA: Diagnosis not present

## 2019-04-27 DIAGNOSIS — M6281 Muscle weakness (generalized): Secondary | ICD-10-CM | POA: Diagnosis not present

## 2019-04-27 DIAGNOSIS — Z7409 Other reduced mobility: Secondary | ICD-10-CM | POA: Diagnosis not present

## 2019-04-27 DIAGNOSIS — R5383 Other fatigue: Secondary | ICD-10-CM | POA: Diagnosis not present

## 2019-04-29 DIAGNOSIS — R5383 Other fatigue: Secondary | ICD-10-CM | POA: Diagnosis not present

## 2019-04-29 DIAGNOSIS — R2681 Unsteadiness on feet: Secondary | ICD-10-CM | POA: Diagnosis not present

## 2019-04-29 DIAGNOSIS — R2689 Other abnormalities of gait and mobility: Secondary | ICD-10-CM | POA: Diagnosis not present

## 2019-04-29 DIAGNOSIS — M6281 Muscle weakness (generalized): Secondary | ICD-10-CM | POA: Diagnosis not present

## 2019-04-29 DIAGNOSIS — Z7409 Other reduced mobility: Secondary | ICD-10-CM | POA: Diagnosis not present

## 2019-05-12 DIAGNOSIS — Z1231 Encounter for screening mammogram for malignant neoplasm of breast: Secondary | ICD-10-CM | POA: Diagnosis not present

## 2019-05-17 DIAGNOSIS — E785 Hyperlipidemia, unspecified: Secondary | ICD-10-CM | POA: Diagnosis not present

## 2019-05-17 DIAGNOSIS — D649 Anemia, unspecified: Secondary | ICD-10-CM | POA: Diagnosis not present

## 2019-05-17 DIAGNOSIS — E559 Vitamin D deficiency, unspecified: Secondary | ICD-10-CM | POA: Diagnosis not present

## 2019-05-17 DIAGNOSIS — E538 Deficiency of other specified B group vitamins: Secondary | ICD-10-CM | POA: Diagnosis not present

## 2019-05-17 DIAGNOSIS — I639 Cerebral infarction, unspecified: Secondary | ICD-10-CM | POA: Diagnosis not present

## 2019-05-28 ENCOUNTER — Other Ambulatory Visit: Payer: Self-pay | Admitting: Cardiology

## 2019-06-17 DIAGNOSIS — D649 Anemia, unspecified: Secondary | ICD-10-CM | POA: Diagnosis not present

## 2019-06-17 DIAGNOSIS — I639 Cerebral infarction, unspecified: Secondary | ICD-10-CM | POA: Diagnosis not present

## 2019-06-17 DIAGNOSIS — E559 Vitamin D deficiency, unspecified: Secondary | ICD-10-CM | POA: Diagnosis not present

## 2019-06-17 DIAGNOSIS — E785 Hyperlipidemia, unspecified: Secondary | ICD-10-CM | POA: Diagnosis not present

## 2019-06-17 DIAGNOSIS — G4700 Insomnia, unspecified: Secondary | ICD-10-CM | POA: Diagnosis not present

## 2019-06-18 DIAGNOSIS — Z7902 Long term (current) use of antithrombotics/antiplatelets: Secondary | ICD-10-CM | POA: Diagnosis not present

## 2019-06-18 DIAGNOSIS — R112 Nausea with vomiting, unspecified: Secondary | ICD-10-CM | POA: Diagnosis not present

## 2019-06-18 DIAGNOSIS — J984 Other disorders of lung: Secondary | ICD-10-CM | POA: Diagnosis not present

## 2019-06-18 DIAGNOSIS — R52 Pain, unspecified: Secondary | ICD-10-CM | POA: Diagnosis not present

## 2019-06-18 DIAGNOSIS — I110 Hypertensive heart disease with heart failure: Secondary | ICD-10-CM | POA: Diagnosis not present

## 2019-06-18 DIAGNOSIS — K5903 Drug induced constipation: Secondary | ICD-10-CM | POA: Diagnosis not present

## 2019-06-18 DIAGNOSIS — Z85118 Personal history of other malignant neoplasm of bronchus and lung: Secondary | ICD-10-CM | POA: Diagnosis not present

## 2019-06-18 DIAGNOSIS — J189 Pneumonia, unspecified organism: Secondary | ICD-10-CM | POA: Diagnosis not present

## 2019-06-18 DIAGNOSIS — R531 Weakness: Secondary | ICD-10-CM | POA: Diagnosis not present

## 2019-06-18 DIAGNOSIS — M1990 Unspecified osteoarthritis, unspecified site: Secondary | ICD-10-CM | POA: Diagnosis not present

## 2019-06-18 DIAGNOSIS — R1084 Generalized abdominal pain: Secondary | ICD-10-CM | POA: Diagnosis not present

## 2019-06-18 DIAGNOSIS — Z79891 Long term (current) use of opiate analgesic: Secondary | ICD-10-CM | POA: Diagnosis not present

## 2019-06-18 DIAGNOSIS — R110 Nausea: Secondary | ICD-10-CM | POA: Diagnosis not present

## 2019-06-18 DIAGNOSIS — Z79899 Other long term (current) drug therapy: Secondary | ICD-10-CM | POA: Diagnosis not present

## 2019-06-18 DIAGNOSIS — J449 Chronic obstructive pulmonary disease, unspecified: Secondary | ICD-10-CM | POA: Diagnosis not present

## 2019-06-18 DIAGNOSIS — Z03818 Encounter for observation for suspected exposure to other biological agents ruled out: Secondary | ICD-10-CM | POA: Diagnosis not present

## 2019-06-18 DIAGNOSIS — I10 Essential (primary) hypertension: Secondary | ICD-10-CM | POA: Diagnosis not present

## 2019-06-18 DIAGNOSIS — I252 Old myocardial infarction: Secondary | ICD-10-CM | POA: Diagnosis not present

## 2019-06-18 DIAGNOSIS — T40605A Adverse effect of unspecified narcotics, initial encounter: Secondary | ICD-10-CM | POA: Diagnosis not present

## 2019-06-18 DIAGNOSIS — R109 Unspecified abdominal pain: Secondary | ICD-10-CM | POA: Diagnosis not present

## 2019-06-18 DIAGNOSIS — I2510 Atherosclerotic heart disease of native coronary artery without angina pectoris: Secondary | ICD-10-CM | POA: Diagnosis not present

## 2019-06-18 DIAGNOSIS — Z9181 History of falling: Secondary | ICD-10-CM | POA: Diagnosis not present

## 2019-06-18 DIAGNOSIS — R5381 Other malaise: Secondary | ICD-10-CM | POA: Diagnosis not present

## 2019-06-18 DIAGNOSIS — I509 Heart failure, unspecified: Secondary | ICD-10-CM | POA: Diagnosis not present

## 2019-06-18 DIAGNOSIS — E860 Dehydration: Secondary | ICD-10-CM | POA: Diagnosis not present

## 2019-06-18 DIAGNOSIS — K219 Gastro-esophageal reflux disease without esophagitis: Secondary | ICD-10-CM | POA: Diagnosis not present

## 2019-06-20 DIAGNOSIS — R140 Abdominal distension (gaseous): Secondary | ICD-10-CM | POA: Diagnosis not present

## 2019-06-20 DIAGNOSIS — R112 Nausea with vomiting, unspecified: Secondary | ICD-10-CM | POA: Diagnosis not present

## 2019-06-20 DIAGNOSIS — R531 Weakness: Secondary | ICD-10-CM | POA: Diagnosis not present

## 2019-06-20 DIAGNOSIS — R109 Unspecified abdominal pain: Secondary | ICD-10-CM | POA: Diagnosis not present

## 2019-06-20 DIAGNOSIS — I2510 Atherosclerotic heart disease of native coronary artery without angina pectoris: Secondary | ICD-10-CM | POA: Diagnosis not present

## 2019-06-20 DIAGNOSIS — E860 Dehydration: Secondary | ICD-10-CM | POA: Diagnosis not present

## 2019-06-20 DIAGNOSIS — Z79891 Long term (current) use of opiate analgesic: Secondary | ICD-10-CM | POA: Diagnosis not present

## 2019-06-20 DIAGNOSIS — I509 Heart failure, unspecified: Secondary | ICD-10-CM | POA: Diagnosis not present

## 2019-06-20 DIAGNOSIS — J449 Chronic obstructive pulmonary disease, unspecified: Secondary | ICD-10-CM | POA: Diagnosis not present

## 2019-06-20 DIAGNOSIS — M1990 Unspecified osteoarthritis, unspecified site: Secondary | ICD-10-CM | POA: Diagnosis not present

## 2019-06-20 DIAGNOSIS — R5381 Other malaise: Secondary | ICD-10-CM | POA: Diagnosis not present

## 2019-06-20 DIAGNOSIS — Z79899 Other long term (current) drug therapy: Secondary | ICD-10-CM | POA: Diagnosis not present

## 2019-06-20 DIAGNOSIS — I110 Hypertensive heart disease with heart failure: Secondary | ICD-10-CM | POA: Diagnosis not present

## 2019-06-20 DIAGNOSIS — Z7902 Long term (current) use of antithrombotics/antiplatelets: Secondary | ICD-10-CM | POA: Diagnosis not present

## 2019-06-20 DIAGNOSIS — K5903 Drug induced constipation: Secondary | ICD-10-CM | POA: Diagnosis not present

## 2019-06-20 DIAGNOSIS — T40605A Adverse effect of unspecified narcotics, initial encounter: Secondary | ICD-10-CM | POA: Diagnosis not present

## 2019-06-20 DIAGNOSIS — Z85118 Personal history of other malignant neoplasm of bronchus and lung: Secondary | ICD-10-CM | POA: Diagnosis not present

## 2019-06-20 DIAGNOSIS — K219 Gastro-esophageal reflux disease without esophagitis: Secondary | ICD-10-CM | POA: Diagnosis not present

## 2019-06-20 DIAGNOSIS — R1010 Upper abdominal pain, unspecified: Secondary | ICD-10-CM | POA: Diagnosis not present

## 2019-06-20 DIAGNOSIS — I252 Old myocardial infarction: Secondary | ICD-10-CM | POA: Diagnosis not present

## 2019-06-20 DIAGNOSIS — Z9181 History of falling: Secondary | ICD-10-CM | POA: Diagnosis not present

## 2019-06-29 ENCOUNTER — Other Ambulatory Visit: Payer: Self-pay | Admitting: Cardiology

## 2019-06-29 DIAGNOSIS — E785 Hyperlipidemia, unspecified: Secondary | ICD-10-CM | POA: Diagnosis not present

## 2019-06-29 DIAGNOSIS — Z7689 Persons encountering health services in other specified circumstances: Secondary | ICD-10-CM | POA: Diagnosis not present

## 2019-06-29 DIAGNOSIS — E538 Deficiency of other specified B group vitamins: Secondary | ICD-10-CM | POA: Diagnosis not present

## 2019-06-29 DIAGNOSIS — Z79899 Other long term (current) drug therapy: Secondary | ICD-10-CM | POA: Diagnosis not present

## 2019-06-29 DIAGNOSIS — D649 Anemia, unspecified: Secondary | ICD-10-CM | POA: Diagnosis not present

## 2019-06-29 DIAGNOSIS — I2510 Atherosclerotic heart disease of native coronary artery without angina pectoris: Secondary | ICD-10-CM | POA: Diagnosis not present

## 2019-06-29 DIAGNOSIS — E559 Vitamin D deficiency, unspecified: Secondary | ICD-10-CM | POA: Diagnosis not present

## 2019-06-29 DIAGNOSIS — G4700 Insomnia, unspecified: Secondary | ICD-10-CM | POA: Diagnosis not present

## 2019-06-29 NOTE — Telephone Encounter (Signed)
Metoprolol refill sent to [facility], overdue for f/u visit

## 2019-07-27 DIAGNOSIS — M549 Dorsalgia, unspecified: Secondary | ICD-10-CM | POA: Diagnosis not present

## 2019-07-27 DIAGNOSIS — C3490 Malignant neoplasm of unspecified part of unspecified bronchus or lung: Secondary | ICD-10-CM | POA: Diagnosis not present

## 2019-07-27 DIAGNOSIS — K5900 Constipation, unspecified: Secondary | ICD-10-CM | POA: Diagnosis not present

## 2019-07-27 DIAGNOSIS — K922 Gastrointestinal hemorrhage, unspecified: Secondary | ICD-10-CM | POA: Diagnosis not present

## 2019-07-27 DIAGNOSIS — E785 Hyperlipidemia, unspecified: Secondary | ICD-10-CM | POA: Diagnosis not present

## 2019-07-31 DIAGNOSIS — Z7902 Long term (current) use of antithrombotics/antiplatelets: Secondary | ICD-10-CM | POA: Diagnosis not present

## 2019-07-31 DIAGNOSIS — G4700 Insomnia, unspecified: Secondary | ICD-10-CM | POA: Diagnosis not present

## 2019-07-31 DIAGNOSIS — E559 Vitamin D deficiency, unspecified: Secondary | ICD-10-CM | POA: Diagnosis not present

## 2019-07-31 DIAGNOSIS — I252 Old myocardial infarction: Secondary | ICD-10-CM | POA: Diagnosis not present

## 2019-07-31 DIAGNOSIS — I110 Hypertensive heart disease with heart failure: Secondary | ICD-10-CM | POA: Diagnosis not present

## 2019-07-31 DIAGNOSIS — I2510 Atherosclerotic heart disease of native coronary artery without angina pectoris: Secondary | ICD-10-CM | POA: Diagnosis not present

## 2019-07-31 DIAGNOSIS — Z923 Personal history of irradiation: Secondary | ICD-10-CM | POA: Diagnosis not present

## 2019-07-31 DIAGNOSIS — D509 Iron deficiency anemia, unspecified: Secondary | ICD-10-CM | POA: Diagnosis not present

## 2019-07-31 DIAGNOSIS — J9801 Acute bronchospasm: Secondary | ICD-10-CM | POA: Diagnosis not present

## 2019-07-31 DIAGNOSIS — K5900 Constipation, unspecified: Secondary | ICD-10-CM | POA: Diagnosis not present

## 2019-07-31 DIAGNOSIS — I509 Heart failure, unspecified: Secondary | ICD-10-CM | POA: Diagnosis not present

## 2019-07-31 DIAGNOSIS — M818 Other osteoporosis without current pathological fracture: Secondary | ICD-10-CM | POA: Diagnosis not present

## 2019-07-31 DIAGNOSIS — I959 Hypotension, unspecified: Secondary | ICD-10-CM | POA: Diagnosis not present

## 2019-07-31 DIAGNOSIS — Z9221 Personal history of antineoplastic chemotherapy: Secondary | ICD-10-CM | POA: Diagnosis not present

## 2019-07-31 DIAGNOSIS — E876 Hypokalemia: Secondary | ICD-10-CM | POA: Diagnosis not present

## 2019-07-31 DIAGNOSIS — Z79891 Long term (current) use of opiate analgesic: Secondary | ICD-10-CM | POA: Diagnosis not present

## 2019-07-31 DIAGNOSIS — M1990 Unspecified osteoarthritis, unspecified site: Secondary | ICD-10-CM | POA: Diagnosis not present

## 2019-07-31 DIAGNOSIS — Z8673 Personal history of transient ischemic attack (TIA), and cerebral infarction without residual deficits: Secondary | ICD-10-CM | POA: Diagnosis not present

## 2019-07-31 DIAGNOSIS — M5136 Other intervertebral disc degeneration, lumbar region: Secondary | ICD-10-CM | POA: Diagnosis not present

## 2019-07-31 DIAGNOSIS — E538 Deficiency of other specified B group vitamins: Secondary | ICD-10-CM | POA: Diagnosis not present

## 2019-07-31 DIAGNOSIS — C3490 Malignant neoplasm of unspecified part of unspecified bronchus or lung: Secondary | ICD-10-CM | POA: Diagnosis not present

## 2019-07-31 DIAGNOSIS — K253 Acute gastric ulcer without hemorrhage or perforation: Secondary | ICD-10-CM | POA: Diagnosis not present

## 2019-07-31 DIAGNOSIS — E785 Hyperlipidemia, unspecified: Secondary | ICD-10-CM | POA: Diagnosis not present

## 2019-07-31 DIAGNOSIS — J439 Emphysema, unspecified: Secondary | ICD-10-CM | POA: Diagnosis not present

## 2019-08-04 DIAGNOSIS — I2510 Atherosclerotic heart disease of native coronary artery without angina pectoris: Secondary | ICD-10-CM | POA: Diagnosis not present

## 2019-08-04 DIAGNOSIS — Z9221 Personal history of antineoplastic chemotherapy: Secondary | ICD-10-CM | POA: Diagnosis not present

## 2019-08-04 DIAGNOSIS — M5136 Other intervertebral disc degeneration, lumbar region: Secondary | ICD-10-CM | POA: Diagnosis not present

## 2019-08-04 DIAGNOSIS — K253 Acute gastric ulcer without hemorrhage or perforation: Secondary | ICD-10-CM | POA: Diagnosis not present

## 2019-08-04 DIAGNOSIS — I252 Old myocardial infarction: Secondary | ICD-10-CM | POA: Diagnosis not present

## 2019-08-04 DIAGNOSIS — E785 Hyperlipidemia, unspecified: Secondary | ICD-10-CM | POA: Diagnosis not present

## 2019-08-04 DIAGNOSIS — E559 Vitamin D deficiency, unspecified: Secondary | ICD-10-CM | POA: Diagnosis not present

## 2019-08-04 DIAGNOSIS — Z923 Personal history of irradiation: Secondary | ICD-10-CM | POA: Diagnosis not present

## 2019-08-04 DIAGNOSIS — C3490 Malignant neoplasm of unspecified part of unspecified bronchus or lung: Secondary | ICD-10-CM | POA: Diagnosis not present

## 2019-08-04 DIAGNOSIS — I110 Hypertensive heart disease with heart failure: Secondary | ICD-10-CM | POA: Diagnosis not present

## 2019-08-04 DIAGNOSIS — D509 Iron deficiency anemia, unspecified: Secondary | ICD-10-CM | POA: Diagnosis not present

## 2019-08-04 DIAGNOSIS — I959 Hypotension, unspecified: Secondary | ICD-10-CM | POA: Diagnosis not present

## 2019-08-04 DIAGNOSIS — Z8673 Personal history of transient ischemic attack (TIA), and cerebral infarction without residual deficits: Secondary | ICD-10-CM | POA: Diagnosis not present

## 2019-08-04 DIAGNOSIS — J9801 Acute bronchospasm: Secondary | ICD-10-CM | POA: Diagnosis not present

## 2019-08-04 DIAGNOSIS — G4700 Insomnia, unspecified: Secondary | ICD-10-CM | POA: Diagnosis not present

## 2019-08-04 DIAGNOSIS — M1990 Unspecified osteoarthritis, unspecified site: Secondary | ICD-10-CM | POA: Diagnosis not present

## 2019-08-04 DIAGNOSIS — J439 Emphysema, unspecified: Secondary | ICD-10-CM | POA: Diagnosis not present

## 2019-08-04 DIAGNOSIS — Z79891 Long term (current) use of opiate analgesic: Secondary | ICD-10-CM | POA: Diagnosis not present

## 2019-08-04 DIAGNOSIS — E876 Hypokalemia: Secondary | ICD-10-CM | POA: Diagnosis not present

## 2019-08-04 DIAGNOSIS — K5900 Constipation, unspecified: Secondary | ICD-10-CM | POA: Diagnosis not present

## 2019-08-04 DIAGNOSIS — M818 Other osteoporosis without current pathological fracture: Secondary | ICD-10-CM | POA: Diagnosis not present

## 2019-08-04 DIAGNOSIS — Z7902 Long term (current) use of antithrombotics/antiplatelets: Secondary | ICD-10-CM | POA: Diagnosis not present

## 2019-08-04 DIAGNOSIS — E538 Deficiency of other specified B group vitamins: Secondary | ICD-10-CM | POA: Diagnosis not present

## 2019-08-04 DIAGNOSIS — I509 Heart failure, unspecified: Secondary | ICD-10-CM | POA: Diagnosis not present

## 2019-08-17 DIAGNOSIS — Z79891 Long term (current) use of opiate analgesic: Secondary | ICD-10-CM | POA: Diagnosis not present

## 2019-08-17 DIAGNOSIS — I509 Heart failure, unspecified: Secondary | ICD-10-CM | POA: Diagnosis not present

## 2019-08-17 DIAGNOSIS — I110 Hypertensive heart disease with heart failure: Secondary | ICD-10-CM | POA: Diagnosis not present

## 2019-08-17 DIAGNOSIS — M1990 Unspecified osteoarthritis, unspecified site: Secondary | ICD-10-CM | POA: Diagnosis not present

## 2019-08-17 DIAGNOSIS — E876 Hypokalemia: Secondary | ICD-10-CM | POA: Diagnosis not present

## 2019-08-17 DIAGNOSIS — E538 Deficiency of other specified B group vitamins: Secondary | ICD-10-CM | POA: Diagnosis not present

## 2019-08-17 DIAGNOSIS — J9801 Acute bronchospasm: Secondary | ICD-10-CM | POA: Diagnosis not present

## 2019-08-17 DIAGNOSIS — Z7902 Long term (current) use of antithrombotics/antiplatelets: Secondary | ICD-10-CM | POA: Diagnosis not present

## 2019-08-17 DIAGNOSIS — K253 Acute gastric ulcer without hemorrhage or perforation: Secondary | ICD-10-CM | POA: Diagnosis not present

## 2019-08-17 DIAGNOSIS — K5900 Constipation, unspecified: Secondary | ICD-10-CM | POA: Diagnosis not present

## 2019-08-17 DIAGNOSIS — J439 Emphysema, unspecified: Secondary | ICD-10-CM | POA: Diagnosis not present

## 2019-08-17 DIAGNOSIS — E559 Vitamin D deficiency, unspecified: Secondary | ICD-10-CM | POA: Diagnosis not present

## 2019-08-17 DIAGNOSIS — Z9221 Personal history of antineoplastic chemotherapy: Secondary | ICD-10-CM | POA: Diagnosis not present

## 2019-08-17 DIAGNOSIS — C3490 Malignant neoplasm of unspecified part of unspecified bronchus or lung: Secondary | ICD-10-CM | POA: Diagnosis not present

## 2019-08-17 DIAGNOSIS — M818 Other osteoporosis without current pathological fracture: Secondary | ICD-10-CM | POA: Diagnosis not present

## 2019-08-17 DIAGNOSIS — G4700 Insomnia, unspecified: Secondary | ICD-10-CM | POA: Diagnosis not present

## 2019-08-17 DIAGNOSIS — Z8673 Personal history of transient ischemic attack (TIA), and cerebral infarction without residual deficits: Secondary | ICD-10-CM | POA: Diagnosis not present

## 2019-08-17 DIAGNOSIS — M5136 Other intervertebral disc degeneration, lumbar region: Secondary | ICD-10-CM | POA: Diagnosis not present

## 2019-08-17 DIAGNOSIS — E785 Hyperlipidemia, unspecified: Secondary | ICD-10-CM | POA: Diagnosis not present

## 2019-08-17 DIAGNOSIS — I2510 Atherosclerotic heart disease of native coronary artery without angina pectoris: Secondary | ICD-10-CM | POA: Diagnosis not present

## 2019-08-17 DIAGNOSIS — D509 Iron deficiency anemia, unspecified: Secondary | ICD-10-CM | POA: Diagnosis not present

## 2019-08-17 DIAGNOSIS — I959 Hypotension, unspecified: Secondary | ICD-10-CM | POA: Diagnosis not present

## 2019-08-17 DIAGNOSIS — I252 Old myocardial infarction: Secondary | ICD-10-CM | POA: Diagnosis not present

## 2019-08-17 DIAGNOSIS — Z923 Personal history of irradiation: Secondary | ICD-10-CM | POA: Diagnosis not present

## 2019-08-18 DIAGNOSIS — C3490 Malignant neoplasm of unspecified part of unspecified bronchus or lung: Secondary | ICD-10-CM | POA: Diagnosis not present

## 2019-08-18 DIAGNOSIS — J9801 Acute bronchospasm: Secondary | ICD-10-CM | POA: Diagnosis not present

## 2019-08-18 DIAGNOSIS — E538 Deficiency of other specified B group vitamins: Secondary | ICD-10-CM | POA: Diagnosis not present

## 2019-08-18 DIAGNOSIS — J439 Emphysema, unspecified: Secondary | ICD-10-CM | POA: Diagnosis not present

## 2019-08-18 DIAGNOSIS — I2510 Atherosclerotic heart disease of native coronary artery without angina pectoris: Secondary | ICD-10-CM | POA: Diagnosis not present

## 2019-08-18 DIAGNOSIS — D509 Iron deficiency anemia, unspecified: Secondary | ICD-10-CM | POA: Diagnosis not present

## 2019-08-18 DIAGNOSIS — I110 Hypertensive heart disease with heart failure: Secondary | ICD-10-CM | POA: Diagnosis not present

## 2019-08-18 DIAGNOSIS — M5136 Other intervertebral disc degeneration, lumbar region: Secondary | ICD-10-CM | POA: Diagnosis not present

## 2019-08-18 DIAGNOSIS — I509 Heart failure, unspecified: Secondary | ICD-10-CM | POA: Diagnosis not present

## 2019-08-18 DIAGNOSIS — Z9221 Personal history of antineoplastic chemotherapy: Secondary | ICD-10-CM | POA: Diagnosis not present

## 2019-08-18 DIAGNOSIS — E559 Vitamin D deficiency, unspecified: Secondary | ICD-10-CM | POA: Diagnosis not present

## 2019-08-18 DIAGNOSIS — Z923 Personal history of irradiation: Secondary | ICD-10-CM | POA: Diagnosis not present

## 2019-08-18 DIAGNOSIS — Z79891 Long term (current) use of opiate analgesic: Secondary | ICD-10-CM | POA: Diagnosis not present

## 2019-08-18 DIAGNOSIS — K253 Acute gastric ulcer without hemorrhage or perforation: Secondary | ICD-10-CM | POA: Diagnosis not present

## 2019-08-18 DIAGNOSIS — E785 Hyperlipidemia, unspecified: Secondary | ICD-10-CM | POA: Diagnosis not present

## 2019-08-18 DIAGNOSIS — E876 Hypokalemia: Secondary | ICD-10-CM | POA: Diagnosis not present

## 2019-08-18 DIAGNOSIS — Z7902 Long term (current) use of antithrombotics/antiplatelets: Secondary | ICD-10-CM | POA: Diagnosis not present

## 2019-08-18 DIAGNOSIS — I959 Hypotension, unspecified: Secondary | ICD-10-CM | POA: Diagnosis not present

## 2019-08-18 DIAGNOSIS — M818 Other osteoporosis without current pathological fracture: Secondary | ICD-10-CM | POA: Diagnosis not present

## 2019-08-18 DIAGNOSIS — G4700 Insomnia, unspecified: Secondary | ICD-10-CM | POA: Diagnosis not present

## 2019-08-18 DIAGNOSIS — M1990 Unspecified osteoarthritis, unspecified site: Secondary | ICD-10-CM | POA: Diagnosis not present

## 2019-08-18 DIAGNOSIS — K5900 Constipation, unspecified: Secondary | ICD-10-CM | POA: Diagnosis not present

## 2019-08-18 DIAGNOSIS — I252 Old myocardial infarction: Secondary | ICD-10-CM | POA: Diagnosis not present

## 2019-08-18 DIAGNOSIS — Z8673 Personal history of transient ischemic attack (TIA), and cerebral infarction without residual deficits: Secondary | ICD-10-CM | POA: Diagnosis not present

## 2019-08-26 DIAGNOSIS — K5900 Constipation, unspecified: Secondary | ICD-10-CM | POA: Diagnosis not present

## 2019-08-26 DIAGNOSIS — E876 Hypokalemia: Secondary | ICD-10-CM | POA: Diagnosis not present

## 2019-08-26 DIAGNOSIS — Z8673 Personal history of transient ischemic attack (TIA), and cerebral infarction without residual deficits: Secondary | ICD-10-CM | POA: Diagnosis not present

## 2019-08-26 DIAGNOSIS — M1990 Unspecified osteoarthritis, unspecified site: Secondary | ICD-10-CM | POA: Diagnosis not present

## 2019-08-26 DIAGNOSIS — E785 Hyperlipidemia, unspecified: Secondary | ICD-10-CM | POA: Diagnosis not present

## 2019-08-26 DIAGNOSIS — I509 Heart failure, unspecified: Secondary | ICD-10-CM | POA: Diagnosis not present

## 2019-08-26 DIAGNOSIS — I252 Old myocardial infarction: Secondary | ICD-10-CM | POA: Diagnosis not present

## 2019-08-26 DIAGNOSIS — M818 Other osteoporosis without current pathological fracture: Secondary | ICD-10-CM | POA: Diagnosis not present

## 2019-08-26 DIAGNOSIS — M5136 Other intervertebral disc degeneration, lumbar region: Secondary | ICD-10-CM | POA: Diagnosis not present

## 2019-08-26 DIAGNOSIS — Z9221 Personal history of antineoplastic chemotherapy: Secondary | ICD-10-CM | POA: Diagnosis not present

## 2019-08-26 DIAGNOSIS — E538 Deficiency of other specified B group vitamins: Secondary | ICD-10-CM | POA: Diagnosis not present

## 2019-08-26 DIAGNOSIS — Z79891 Long term (current) use of opiate analgesic: Secondary | ICD-10-CM | POA: Diagnosis not present

## 2019-08-26 DIAGNOSIS — I110 Hypertensive heart disease with heart failure: Secondary | ICD-10-CM | POA: Diagnosis not present

## 2019-08-26 DIAGNOSIS — E559 Vitamin D deficiency, unspecified: Secondary | ICD-10-CM | POA: Diagnosis not present

## 2019-08-26 DIAGNOSIS — I959 Hypotension, unspecified: Secondary | ICD-10-CM | POA: Diagnosis not present

## 2019-08-26 DIAGNOSIS — Z923 Personal history of irradiation: Secondary | ICD-10-CM | POA: Diagnosis not present

## 2019-08-26 DIAGNOSIS — C3490 Malignant neoplasm of unspecified part of unspecified bronchus or lung: Secondary | ICD-10-CM | POA: Diagnosis not present

## 2019-08-26 DIAGNOSIS — J439 Emphysema, unspecified: Secondary | ICD-10-CM | POA: Diagnosis not present

## 2019-08-26 DIAGNOSIS — Z7902 Long term (current) use of antithrombotics/antiplatelets: Secondary | ICD-10-CM | POA: Diagnosis not present

## 2019-08-26 DIAGNOSIS — K253 Acute gastric ulcer without hemorrhage or perforation: Secondary | ICD-10-CM | POA: Diagnosis not present

## 2019-08-26 DIAGNOSIS — J9801 Acute bronchospasm: Secondary | ICD-10-CM | POA: Diagnosis not present

## 2019-08-26 DIAGNOSIS — G4700 Insomnia, unspecified: Secondary | ICD-10-CM | POA: Diagnosis not present

## 2019-08-26 DIAGNOSIS — I2510 Atherosclerotic heart disease of native coronary artery without angina pectoris: Secondary | ICD-10-CM | POA: Diagnosis not present

## 2019-08-26 DIAGNOSIS — D509 Iron deficiency anemia, unspecified: Secondary | ICD-10-CM | POA: Diagnosis not present

## 2019-09-01 DIAGNOSIS — M1990 Unspecified osteoarthritis, unspecified site: Secondary | ICD-10-CM | POA: Diagnosis not present

## 2019-09-01 DIAGNOSIS — J439 Emphysema, unspecified: Secondary | ICD-10-CM | POA: Diagnosis not present

## 2019-09-01 DIAGNOSIS — G4700 Insomnia, unspecified: Secondary | ICD-10-CM | POA: Diagnosis not present

## 2019-09-01 DIAGNOSIS — E785 Hyperlipidemia, unspecified: Secondary | ICD-10-CM | POA: Diagnosis not present

## 2019-09-01 DIAGNOSIS — I110 Hypertensive heart disease with heart failure: Secondary | ICD-10-CM | POA: Diagnosis not present

## 2019-09-01 DIAGNOSIS — Z7902 Long term (current) use of antithrombotics/antiplatelets: Secondary | ICD-10-CM | POA: Diagnosis not present

## 2019-09-01 DIAGNOSIS — M5136 Other intervertebral disc degeneration, lumbar region: Secondary | ICD-10-CM | POA: Diagnosis not present

## 2019-09-01 DIAGNOSIS — I252 Old myocardial infarction: Secondary | ICD-10-CM | POA: Diagnosis not present

## 2019-09-01 DIAGNOSIS — M818 Other osteoporosis without current pathological fracture: Secondary | ICD-10-CM | POA: Diagnosis not present

## 2019-09-01 DIAGNOSIS — I509 Heart failure, unspecified: Secondary | ICD-10-CM | POA: Diagnosis not present

## 2019-09-01 DIAGNOSIS — E538 Deficiency of other specified B group vitamins: Secondary | ICD-10-CM | POA: Diagnosis not present

## 2019-09-01 DIAGNOSIS — K253 Acute gastric ulcer without hemorrhage or perforation: Secondary | ICD-10-CM | POA: Diagnosis not present

## 2019-09-01 DIAGNOSIS — K5900 Constipation, unspecified: Secondary | ICD-10-CM | POA: Diagnosis not present

## 2019-09-01 DIAGNOSIS — E559 Vitamin D deficiency, unspecified: Secondary | ICD-10-CM | POA: Diagnosis not present

## 2019-09-01 DIAGNOSIS — I2510 Atherosclerotic heart disease of native coronary artery without angina pectoris: Secondary | ICD-10-CM | POA: Diagnosis not present

## 2019-09-01 DIAGNOSIS — Z923 Personal history of irradiation: Secondary | ICD-10-CM | POA: Diagnosis not present

## 2019-09-01 DIAGNOSIS — Z9221 Personal history of antineoplastic chemotherapy: Secondary | ICD-10-CM | POA: Diagnosis not present

## 2019-09-01 DIAGNOSIS — J9801 Acute bronchospasm: Secondary | ICD-10-CM | POA: Diagnosis not present

## 2019-09-01 DIAGNOSIS — Z8673 Personal history of transient ischemic attack (TIA), and cerebral infarction without residual deficits: Secondary | ICD-10-CM | POA: Diagnosis not present

## 2019-09-01 DIAGNOSIS — Z79891 Long term (current) use of opiate analgesic: Secondary | ICD-10-CM | POA: Diagnosis not present

## 2019-09-01 DIAGNOSIS — D509 Iron deficiency anemia, unspecified: Secondary | ICD-10-CM | POA: Diagnosis not present

## 2019-09-01 DIAGNOSIS — I959 Hypotension, unspecified: Secondary | ICD-10-CM | POA: Diagnosis not present

## 2019-09-01 DIAGNOSIS — C3490 Malignant neoplasm of unspecified part of unspecified bronchus or lung: Secondary | ICD-10-CM | POA: Diagnosis not present

## 2019-09-01 DIAGNOSIS — E876 Hypokalemia: Secondary | ICD-10-CM | POA: Diagnosis not present

## 2019-09-02 DIAGNOSIS — E876 Hypokalemia: Secondary | ICD-10-CM | POA: Diagnosis not present

## 2019-09-02 DIAGNOSIS — M818 Other osteoporosis without current pathological fracture: Secondary | ICD-10-CM | POA: Diagnosis not present

## 2019-09-02 DIAGNOSIS — K922 Gastrointestinal hemorrhage, unspecified: Secondary | ICD-10-CM | POA: Diagnosis not present

## 2019-09-02 DIAGNOSIS — E538 Deficiency of other specified B group vitamins: Secondary | ICD-10-CM | POA: Diagnosis not present

## 2019-09-02 DIAGNOSIS — K5900 Constipation, unspecified: Secondary | ICD-10-CM | POA: Diagnosis not present

## 2019-09-02 DIAGNOSIS — M5136 Other intervertebral disc degeneration, lumbar region: Secondary | ICD-10-CM | POA: Diagnosis not present

## 2019-09-02 DIAGNOSIS — Z79891 Long term (current) use of opiate analgesic: Secondary | ICD-10-CM | POA: Diagnosis not present

## 2019-09-02 DIAGNOSIS — Z8673 Personal history of transient ischemic attack (TIA), and cerebral infarction without residual deficits: Secondary | ICD-10-CM | POA: Diagnosis not present

## 2019-09-02 DIAGNOSIS — I959 Hypotension, unspecified: Secondary | ICD-10-CM | POA: Diagnosis not present

## 2019-09-02 DIAGNOSIS — I110 Hypertensive heart disease with heart failure: Secondary | ICD-10-CM | POA: Diagnosis not present

## 2019-09-02 DIAGNOSIS — D509 Iron deficiency anemia, unspecified: Secondary | ICD-10-CM | POA: Diagnosis not present

## 2019-09-02 DIAGNOSIS — M549 Dorsalgia, unspecified: Secondary | ICD-10-CM | POA: Diagnosis not present

## 2019-09-02 DIAGNOSIS — E785 Hyperlipidemia, unspecified: Secondary | ICD-10-CM | POA: Diagnosis not present

## 2019-09-02 DIAGNOSIS — I2510 Atherosclerotic heart disease of native coronary artery without angina pectoris: Secondary | ICD-10-CM | POA: Diagnosis not present

## 2019-09-02 DIAGNOSIS — E559 Vitamin D deficiency, unspecified: Secondary | ICD-10-CM | POA: Diagnosis not present

## 2019-09-02 DIAGNOSIS — C3490 Malignant neoplasm of unspecified part of unspecified bronchus or lung: Secondary | ICD-10-CM | POA: Diagnosis not present

## 2019-09-02 DIAGNOSIS — I252 Old myocardial infarction: Secondary | ICD-10-CM | POA: Diagnosis not present

## 2019-09-02 DIAGNOSIS — G4700 Insomnia, unspecified: Secondary | ICD-10-CM | POA: Diagnosis not present

## 2019-09-02 DIAGNOSIS — K253 Acute gastric ulcer without hemorrhage or perforation: Secondary | ICD-10-CM | POA: Diagnosis not present

## 2019-09-02 DIAGNOSIS — Z9221 Personal history of antineoplastic chemotherapy: Secondary | ICD-10-CM | POA: Diagnosis not present

## 2019-09-02 DIAGNOSIS — J439 Emphysema, unspecified: Secondary | ICD-10-CM | POA: Diagnosis not present

## 2019-09-02 DIAGNOSIS — Z923 Personal history of irradiation: Secondary | ICD-10-CM | POA: Diagnosis not present

## 2019-09-02 DIAGNOSIS — I509 Heart failure, unspecified: Secondary | ICD-10-CM | POA: Diagnosis not present

## 2019-09-02 DIAGNOSIS — M1990 Unspecified osteoarthritis, unspecified site: Secondary | ICD-10-CM | POA: Diagnosis not present

## 2019-09-02 DIAGNOSIS — J9801 Acute bronchospasm: Secondary | ICD-10-CM | POA: Diagnosis not present

## 2019-09-02 DIAGNOSIS — Z7902 Long term (current) use of antithrombotics/antiplatelets: Secondary | ICD-10-CM | POA: Diagnosis not present

## 2019-09-09 DIAGNOSIS — E785 Hyperlipidemia, unspecified: Secondary | ICD-10-CM | POA: Diagnosis not present

## 2019-09-09 DIAGNOSIS — J439 Emphysema, unspecified: Secondary | ICD-10-CM | POA: Diagnosis not present

## 2019-09-09 DIAGNOSIS — E876 Hypokalemia: Secondary | ICD-10-CM | POA: Diagnosis not present

## 2019-09-09 DIAGNOSIS — I110 Hypertensive heart disease with heart failure: Secondary | ICD-10-CM | POA: Diagnosis not present

## 2019-09-09 DIAGNOSIS — J9801 Acute bronchospasm: Secondary | ICD-10-CM | POA: Diagnosis not present

## 2019-09-09 DIAGNOSIS — K5900 Constipation, unspecified: Secondary | ICD-10-CM | POA: Diagnosis not present

## 2019-09-09 DIAGNOSIS — M818 Other osteoporosis without current pathological fracture: Secondary | ICD-10-CM | POA: Diagnosis not present

## 2019-09-09 DIAGNOSIS — Z9221 Personal history of antineoplastic chemotherapy: Secondary | ICD-10-CM | POA: Diagnosis not present

## 2019-09-09 DIAGNOSIS — Z79891 Long term (current) use of opiate analgesic: Secondary | ICD-10-CM | POA: Diagnosis not present

## 2019-09-09 DIAGNOSIS — I509 Heart failure, unspecified: Secondary | ICD-10-CM | POA: Diagnosis not present

## 2019-09-09 DIAGNOSIS — E538 Deficiency of other specified B group vitamins: Secondary | ICD-10-CM | POA: Diagnosis not present

## 2019-09-09 DIAGNOSIS — C3490 Malignant neoplasm of unspecified part of unspecified bronchus or lung: Secondary | ICD-10-CM | POA: Diagnosis not present

## 2019-09-09 DIAGNOSIS — M1990 Unspecified osteoarthritis, unspecified site: Secondary | ICD-10-CM | POA: Diagnosis not present

## 2019-09-09 DIAGNOSIS — G4700 Insomnia, unspecified: Secondary | ICD-10-CM | POA: Diagnosis not present

## 2019-09-09 DIAGNOSIS — E559 Vitamin D deficiency, unspecified: Secondary | ICD-10-CM | POA: Diagnosis not present

## 2019-09-09 DIAGNOSIS — D509 Iron deficiency anemia, unspecified: Secondary | ICD-10-CM | POA: Diagnosis not present

## 2019-09-09 DIAGNOSIS — I959 Hypotension, unspecified: Secondary | ICD-10-CM | POA: Diagnosis not present

## 2019-09-09 DIAGNOSIS — K253 Acute gastric ulcer without hemorrhage or perforation: Secondary | ICD-10-CM | POA: Diagnosis not present

## 2019-09-09 DIAGNOSIS — I2510 Atherosclerotic heart disease of native coronary artery without angina pectoris: Secondary | ICD-10-CM | POA: Diagnosis not present

## 2019-09-09 DIAGNOSIS — Z923 Personal history of irradiation: Secondary | ICD-10-CM | POA: Diagnosis not present

## 2019-09-09 DIAGNOSIS — I252 Old myocardial infarction: Secondary | ICD-10-CM | POA: Diagnosis not present

## 2019-09-09 DIAGNOSIS — Z8673 Personal history of transient ischemic attack (TIA), and cerebral infarction without residual deficits: Secondary | ICD-10-CM | POA: Diagnosis not present

## 2019-09-09 DIAGNOSIS — Z7902 Long term (current) use of antithrombotics/antiplatelets: Secondary | ICD-10-CM | POA: Diagnosis not present

## 2019-09-09 DIAGNOSIS — M5136 Other intervertebral disc degeneration, lumbar region: Secondary | ICD-10-CM | POA: Diagnosis not present

## 2019-09-16 DIAGNOSIS — Z8673 Personal history of transient ischemic attack (TIA), and cerebral infarction without residual deficits: Secondary | ICD-10-CM | POA: Diagnosis not present

## 2019-09-16 DIAGNOSIS — I2510 Atherosclerotic heart disease of native coronary artery without angina pectoris: Secondary | ICD-10-CM | POA: Diagnosis not present

## 2019-09-16 DIAGNOSIS — I509 Heart failure, unspecified: Secondary | ICD-10-CM | POA: Diagnosis not present

## 2019-09-16 DIAGNOSIS — K253 Acute gastric ulcer without hemorrhage or perforation: Secondary | ICD-10-CM | POA: Diagnosis not present

## 2019-09-16 DIAGNOSIS — I959 Hypotension, unspecified: Secondary | ICD-10-CM | POA: Diagnosis not present

## 2019-09-16 DIAGNOSIS — G4700 Insomnia, unspecified: Secondary | ICD-10-CM | POA: Diagnosis not present

## 2019-09-16 DIAGNOSIS — Z923 Personal history of irradiation: Secondary | ICD-10-CM | POA: Diagnosis not present

## 2019-09-16 DIAGNOSIS — Z79891 Long term (current) use of opiate analgesic: Secondary | ICD-10-CM | POA: Diagnosis not present

## 2019-09-16 DIAGNOSIS — M5136 Other intervertebral disc degeneration, lumbar region: Secondary | ICD-10-CM | POA: Diagnosis not present

## 2019-09-16 DIAGNOSIS — K5900 Constipation, unspecified: Secondary | ICD-10-CM | POA: Diagnosis not present

## 2019-09-16 DIAGNOSIS — D509 Iron deficiency anemia, unspecified: Secondary | ICD-10-CM | POA: Diagnosis not present

## 2019-09-16 DIAGNOSIS — M818 Other osteoporosis without current pathological fracture: Secondary | ICD-10-CM | POA: Diagnosis not present

## 2019-09-16 DIAGNOSIS — E559 Vitamin D deficiency, unspecified: Secondary | ICD-10-CM | POA: Diagnosis not present

## 2019-09-16 DIAGNOSIS — E538 Deficiency of other specified B group vitamins: Secondary | ICD-10-CM | POA: Diagnosis not present

## 2019-09-16 DIAGNOSIS — Z9221 Personal history of antineoplastic chemotherapy: Secondary | ICD-10-CM | POA: Diagnosis not present

## 2019-09-16 DIAGNOSIS — E785 Hyperlipidemia, unspecified: Secondary | ICD-10-CM | POA: Diagnosis not present

## 2019-09-16 DIAGNOSIS — M1990 Unspecified osteoarthritis, unspecified site: Secondary | ICD-10-CM | POA: Diagnosis not present

## 2019-09-16 DIAGNOSIS — J439 Emphysema, unspecified: Secondary | ICD-10-CM | POA: Diagnosis not present

## 2019-09-16 DIAGNOSIS — I252 Old myocardial infarction: Secondary | ICD-10-CM | POA: Diagnosis not present

## 2019-09-16 DIAGNOSIS — I110 Hypertensive heart disease with heart failure: Secondary | ICD-10-CM | POA: Diagnosis not present

## 2019-09-16 DIAGNOSIS — C3490 Malignant neoplasm of unspecified part of unspecified bronchus or lung: Secondary | ICD-10-CM | POA: Diagnosis not present

## 2019-09-16 DIAGNOSIS — J9801 Acute bronchospasm: Secondary | ICD-10-CM | POA: Diagnosis not present

## 2019-09-16 DIAGNOSIS — Z7902 Long term (current) use of antithrombotics/antiplatelets: Secondary | ICD-10-CM | POA: Diagnosis not present

## 2019-09-16 DIAGNOSIS — E876 Hypokalemia: Secondary | ICD-10-CM | POA: Diagnosis not present

## 2019-09-28 DIAGNOSIS — E876 Hypokalemia: Secondary | ICD-10-CM | POA: Diagnosis not present

## 2019-09-28 DIAGNOSIS — Z923 Personal history of irradiation: Secondary | ICD-10-CM | POA: Diagnosis not present

## 2019-09-28 DIAGNOSIS — E538 Deficiency of other specified B group vitamins: Secondary | ICD-10-CM | POA: Diagnosis not present

## 2019-09-28 DIAGNOSIS — M818 Other osteoporosis without current pathological fracture: Secondary | ICD-10-CM | POA: Diagnosis not present

## 2019-09-28 DIAGNOSIS — Z9221 Personal history of antineoplastic chemotherapy: Secondary | ICD-10-CM | POA: Diagnosis not present

## 2019-09-28 DIAGNOSIS — E785 Hyperlipidemia, unspecified: Secondary | ICD-10-CM | POA: Diagnosis not present

## 2019-09-28 DIAGNOSIS — Z79891 Long term (current) use of opiate analgesic: Secondary | ICD-10-CM | POA: Diagnosis not present

## 2019-09-28 DIAGNOSIS — K5900 Constipation, unspecified: Secondary | ICD-10-CM | POA: Diagnosis not present

## 2019-09-28 DIAGNOSIS — D509 Iron deficiency anemia, unspecified: Secondary | ICD-10-CM | POA: Diagnosis not present

## 2019-09-28 DIAGNOSIS — M5136 Other intervertebral disc degeneration, lumbar region: Secondary | ICD-10-CM | POA: Diagnosis not present

## 2019-09-28 DIAGNOSIS — M1990 Unspecified osteoarthritis, unspecified site: Secondary | ICD-10-CM | POA: Diagnosis not present

## 2019-09-28 DIAGNOSIS — I252 Old myocardial infarction: Secondary | ICD-10-CM | POA: Diagnosis not present

## 2019-09-28 DIAGNOSIS — J439 Emphysema, unspecified: Secondary | ICD-10-CM | POA: Diagnosis not present

## 2019-09-28 DIAGNOSIS — Z8673 Personal history of transient ischemic attack (TIA), and cerebral infarction without residual deficits: Secondary | ICD-10-CM | POA: Diagnosis not present

## 2019-09-28 DIAGNOSIS — C3490 Malignant neoplasm of unspecified part of unspecified bronchus or lung: Secondary | ICD-10-CM | POA: Diagnosis not present

## 2019-09-28 DIAGNOSIS — E559 Vitamin D deficiency, unspecified: Secondary | ICD-10-CM | POA: Diagnosis not present

## 2019-09-28 DIAGNOSIS — Z7902 Long term (current) use of antithrombotics/antiplatelets: Secondary | ICD-10-CM | POA: Diagnosis not present

## 2019-09-28 DIAGNOSIS — I959 Hypotension, unspecified: Secondary | ICD-10-CM | POA: Diagnosis not present

## 2019-09-28 DIAGNOSIS — J9801 Acute bronchospasm: Secondary | ICD-10-CM | POA: Diagnosis not present

## 2019-09-28 DIAGNOSIS — K253 Acute gastric ulcer without hemorrhage or perforation: Secondary | ICD-10-CM | POA: Diagnosis not present

## 2019-09-28 DIAGNOSIS — I509 Heart failure, unspecified: Secondary | ICD-10-CM | POA: Diagnosis not present

## 2019-09-28 DIAGNOSIS — I110 Hypertensive heart disease with heart failure: Secondary | ICD-10-CM | POA: Diagnosis not present

## 2019-09-28 DIAGNOSIS — I2510 Atherosclerotic heart disease of native coronary artery without angina pectoris: Secondary | ICD-10-CM | POA: Diagnosis not present

## 2019-09-28 DIAGNOSIS — G4700 Insomnia, unspecified: Secondary | ICD-10-CM | POA: Diagnosis not present

## 2019-11-19 DIAGNOSIS — Z139 Encounter for screening, unspecified: Secondary | ICD-10-CM | POA: Diagnosis not present

## 2019-11-19 DIAGNOSIS — E559 Vitamin D deficiency, unspecified: Secondary | ICD-10-CM | POA: Diagnosis not present

## 2019-11-19 DIAGNOSIS — D649 Anemia, unspecified: Secondary | ICD-10-CM | POA: Diagnosis not present

## 2019-11-19 DIAGNOSIS — I2510 Atherosclerotic heart disease of native coronary artery without angina pectoris: Secondary | ICD-10-CM | POA: Diagnosis not present

## 2019-11-19 DIAGNOSIS — E785 Hyperlipidemia, unspecified: Secondary | ICD-10-CM | POA: Diagnosis not present

## 2019-11-19 DIAGNOSIS — E538 Deficiency of other specified B group vitamins: Secondary | ICD-10-CM | POA: Diagnosis not present

## 2019-12-04 DIAGNOSIS — R197 Diarrhea, unspecified: Secondary | ICD-10-CM | POA: Diagnosis not present

## 2019-12-04 DIAGNOSIS — Z79899 Other long term (current) drug therapy: Secondary | ICD-10-CM | POA: Diagnosis not present

## 2019-12-04 DIAGNOSIS — I129 Hypertensive chronic kidney disease with stage 1 through stage 4 chronic kidney disease, or unspecified chronic kidney disease: Secondary | ICD-10-CM | POA: Diagnosis not present

## 2019-12-04 DIAGNOSIS — S22080D Wedge compression fracture of T11-T12 vertebra, subsequent encounter for fracture with routine healing: Secondary | ICD-10-CM | POA: Diagnosis not present

## 2019-12-04 DIAGNOSIS — M5414 Radiculopathy, thoracic region: Secondary | ICD-10-CM | POA: Diagnosis not present

## 2019-12-04 DIAGNOSIS — R1084 Generalized abdominal pain: Secondary | ICD-10-CM | POA: Diagnosis not present

## 2019-12-04 DIAGNOSIS — M549 Dorsalgia, unspecified: Secondary | ICD-10-CM | POA: Diagnosis not present

## 2019-12-04 DIAGNOSIS — K5900 Constipation, unspecified: Secondary | ICD-10-CM | POA: Diagnosis not present

## 2019-12-04 DIAGNOSIS — I509 Heart failure, unspecified: Secondary | ICD-10-CM | POA: Diagnosis not present

## 2019-12-04 DIAGNOSIS — Z79891 Long term (current) use of opiate analgesic: Secondary | ICD-10-CM | POA: Diagnosis not present

## 2019-12-04 DIAGNOSIS — R1110 Vomiting, unspecified: Secondary | ICD-10-CM | POA: Diagnosis not present

## 2019-12-04 DIAGNOSIS — R112 Nausea with vomiting, unspecified: Secondary | ICD-10-CM | POA: Diagnosis not present

## 2019-12-04 DIAGNOSIS — N189 Chronic kidney disease, unspecified: Secondary | ICD-10-CM | POA: Diagnosis not present

## 2019-12-04 DIAGNOSIS — R109 Unspecified abdominal pain: Secondary | ICD-10-CM | POA: Diagnosis not present

## 2019-12-04 DIAGNOSIS — G8929 Other chronic pain: Secondary | ICD-10-CM | POA: Diagnosis not present

## 2019-12-04 DIAGNOSIS — Z7902 Long term (current) use of antithrombotics/antiplatelets: Secondary | ICD-10-CM | POA: Diagnosis not present

## 2019-12-04 DIAGNOSIS — I130 Hypertensive heart and chronic kidney disease with heart failure and stage 1 through stage 4 chronic kidney disease, or unspecified chronic kidney disease: Secondary | ICD-10-CM | POA: Diagnosis not present

## 2019-12-06 ENCOUNTER — Encounter: Payer: Self-pay | Admitting: Gastroenterology

## 2019-12-20 DIAGNOSIS — N289 Disorder of kidney and ureter, unspecified: Secondary | ICD-10-CM | POA: Diagnosis not present

## 2019-12-20 DIAGNOSIS — E538 Deficiency of other specified B group vitamins: Secondary | ICD-10-CM | POA: Diagnosis not present

## 2019-12-28 ENCOUNTER — Other Ambulatory Visit: Payer: Self-pay

## 2019-12-28 ENCOUNTER — Ambulatory Visit (INDEPENDENT_AMBULATORY_CARE_PROVIDER_SITE_OTHER): Payer: Medicare Other | Admitting: Gastroenterology

## 2019-12-28 ENCOUNTER — Telehealth: Payer: Self-pay

## 2019-12-28 ENCOUNTER — Encounter: Payer: Self-pay | Admitting: Gastroenterology

## 2019-12-28 VITALS — BP 136/82 | HR 70 | Temp 97.5°F | Ht 66.0 in | Wt 157.4 lb

## 2019-12-28 DIAGNOSIS — Z01818 Encounter for other preprocedural examination: Secondary | ICD-10-CM | POA: Diagnosis not present

## 2019-12-28 DIAGNOSIS — K5909 Other constipation: Secondary | ICD-10-CM | POA: Diagnosis not present

## 2019-12-28 DIAGNOSIS — R1030 Lower abdominal pain, unspecified: Secondary | ICD-10-CM | POA: Diagnosis not present

## 2019-12-28 MED ORDER — MOVANTIK 25 MG PO TABS: 25.0000 mg | ORAL_TABLET | Freq: Every day | ORAL | 6 refills | Status: DC

## 2019-12-28 NOTE — Progress Notes (Signed)
Chief Complaint: Lower abdominal pain.  Referring Provider:  No ref. provider found      ASSESSMENT AND PLAN;   #1. Lower abdo pain. Neg CT AP 12/04/2019 except for T12 compression fracture.  #2. Chronic contipation likely d/t narcotics (OIC)   Plan: - Miralax 17g po bid - Movantik 71m po qd #30, 6 refills - Colon 2 day prep after holding plavix 5 days after cardio clearencee. - If still with problems, will give her a trial of Motegrity. - Minimize pain medications. - Discussed in detail with the patient and patient's family.    HPI:    Ashley Miles a 74y.o. 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  Labs were unremarkable except for mildly elevated WBC count with normal hemoglobin, creatinine 1.1.  She has been sent to GI clinic for further evaluation by means of colonoscopy.  She has longstanding history of lower abdo pain, which gets better with defecation.  Mostly crampy Constipation d/t pain meds -having 2-3 BM/week despite MiraLAX 17 g p.o. once a day.  She was supposed to be taking Movantik which she ran out several months ago.  She has not been taking Amitiza either.  No melena or hematochezia.  She denies having any upper GI symptoms at the present time.  [facility]  No weight loss.  No over-the-counter nonsteroidals.  No fever chills or night sweats.  Past GI work-up: -CT Abdo/pelvis with IV contrast 12/04/2019: No acute abnormalities, aortic atherosclerosis, stable mild compression fracture of T12 -EGD 02/2017: Small HH, healing of gastric ulcer.  Neg CLO  Past Medical History:  Diagnosis Date  . Acute blood  loss anemia   . Acute kidney failure ([place]   . Anxiety   . Arthritis   . CHF [place]congestive heart failure) [place]   . COPD (chronic obstructive pulmonary disease) ([place]   . Gastric ulcer   . GERD (gastroesophageal reflux disease)   . Hiatal hernia   . Hyperlipidemia   . Hypertension   . Osteopenia   . Small cell lung cancer ([place]   . T12 compression fracture ([place]   . Tobacco use     Past Surgical History:  Procedure Laterality Date  . CESAREAN SECTION    . ESOPHAGOGASTRODUODENOSCOPY  02/17/2017   Healing of the gastic ulcer- no antral scar. Small hiatal hernia  . LEFT HEART CATH AND CORONARY ANGIOGRAPHY N/A 01/09/2017   Procedure: Left Heart Cath and Coronary Angiography;  Surgeon: MBurnell Blanks MD;  [facility];  Service: Cardiovascular;  Laterality: N/A;  . LUNG CANCER SURGERY  2014    Family History  Problem Relation Age of Onset  . Hypertension Father        died pnuemonia  . Cancer Father   . Blindness Mother        died falling out of wheelchair  . Colon cancer Neg Hx   . Esophageal cancer Neg Hx     Social History   Tobacco Use  . Smoking status: Current Every Day Smoker    Types: Cigarettes  . Smokeless tobacco: Never Used  Substance Use Topics  . Alcohol use: No  .  Drug use: No    Current Outpatient Medications  Medication Sig Dispense Refill  . aspirin EC 81 MG EC tablet Take 1 tablet (81 mg total) by mouth daily. 30 tablet 11  . buPROPion (WELLBUTRIN SR) 150 MG 12 hr tablet Take 150 mg by mouth 2 (two) times daily.    . clopidogrel (PLAVIX) 75 MG tablet Take 1 tablet (75 mg total) by mouth daily. 30 tablet 6  . doxylamine, Sleep, (UNISOM) 25 MG tablet Take 25 mg by mouth at bedtime as needed.    . ezetimibe (ZETIA) 10 MG tablet Take 1 tablet (10 mg total) by mouth daily. 30 tablet 6  . Ferrous Sulfate (FEROSUL PO) Take 325 mg by mouth daily.    Marland Kitchen lubiprostone (AMITIZA) 24 MCG capsule Take 24 mcg by mouth 2 (two) times daily  with a meal.    . metoprolol succinate (TOPROL-XL) 25 MG 24 hr tablet TAKE 1 TABLET BY MOUTH ONCE DAILY. 30 tablet 0  . morphine (MS CONTIN) 15 MG 12 hr tablet Take 15 mg by mouth 2 (two) times daily.    Marland Kitchen MOVANTIK 25 MG TABS tablet Take 25 mg by mouth daily.    . nitroGLYCERIN (NITROSTAT) 0.4 MG SL tablet Place 1 tablet (0.4 mg total) under the tongue every 5 (five) minutes x 3 doses as needed for chest pain. 25 tablet 12  . ondansetron (ZOFRAN) 4 MG tablet Take 4 mg by mouth every 8 (eight) hours as needed for nausea or vomiting.    Marland Kitchen oxyCODONE-acetaminophen (PERCOCET) 10-325 MG tablet Take 1 tablet by mouth every 6 (six) hours as needed for pain.    . polyethylene glycol (MIRALAX / GLYCOLAX) packet Take 17 g by mouth daily.    . potassium chloride (K-DUR) 10 MEQ tablet Take 10 mEq by mouth daily.    . prochlorperazine (COMPAZINE) 10 MG tablet Take 10 mg by mouth 3 (three) times daily as needed for nausea/vomiting.    . rosuvastatin (CRESTOR) 10 MG tablet Take 1 tablet by mouth daily.    . Vitamin D, Ergocalciferol, (DRISDOL) 50000 units CAPS capsule Take 50,000 Units by mouth every 7 (seven) days.     No current facility-administered medications for this visit.    No Known Allergies  Review of Systems:  Constitutional: Denies fever, chills, diaphoresis, appetite change andhas  fatigue.  HEENT: Denies photophobia, eye pain, redness, hearing loss, ear pain, congestion, sore throat, rhinorrhea, sneezing, mouth sores, neck pain, neck stiffness and tinnitus.   Respiratory: Denies SOB, DOE, cough..  Has occasional wheezing. Cardiovascular: Denies chest pain, palpitations and leg swelling.  Genitourinary: Denies dysuria, urgency, frequency, hematuria, flank pain and difficulty urinating.  Musculoskeletal: Has  myalgias, back pain, joint swelling, arthralgias and gait problem.  Skin: No rash.  Neurological: Denies dizziness, seizures, syncope, weakness, light-headedness, numbness and headaches.    Hematological: Denies adenopathy. Easy bruising, personal or family bleeding history  Psychiatric/Behavioral: Has anxiety or depression     Physical Exam:    BP 136/82   Pulse 70   Temp (!) 97.5 F (36.4 C)   Ht '5\' 6"'  (1.676 m)   Wt 157 lb 6 oz (71.4 kg)   BMI 25.40 kg/m  Wt Readings from Last 3 Encounters:  12/28/19 157 lb 6 oz (71.4 kg)  09/14/18 156 lb 6.4 oz (70.9 kg)  05/21/18 153 lb 12.8 oz (69.8 kg)   Constitutional:  Well-developed, in no acute distress. Psychiatric: Normal mood and affect. Behavior is normal. HEENT: Pupils normal.  Conjunctivae are normal. No scleral icterus. Neck supple.  Cardiovascular: Normal rate, regular rhythm. No edema Pulmonary/chest: Effort normal and bilateral decreased breath sounds. No wheezing, rales or rhonchi. Abdominal: Soft, nondistended. Nontender. Bowel sounds active throughout. There are no masses palpable. No hepatomegaly. Rectal:  defered Neurological: Alert and oriented to person place and time. Skin: Skin is warm and dry. No rashes noted.  Data Reviewed: I have personally reviewed following labs and imaging studies  CBC: CBC Latest Ref Rng & Units 02/03/2017 01/13/2017 01/12/2017  WBC 3.4 - 10.8 x10E3/uL 8.2 8.6 10.6(H)  Hemoglobin 11.1 - 15.9 g/dL 10.7(L) 10.0(L) 10.6(L)  Hematocrit 34.0 - 46.6 % 34.1 32.0(L) 33.3(L)  Platelets 150 - 379 x10E3/uL 406(H) 324 312    CMP: CMP Latest Ref Rng & Units 05/21/2018 05/28/2017 04/03/2017  Glucose 65 - 99 mg/dL 90 103(H) 109(H)  BUN 8 - 27 mg/dL '14 10 11  ' Creatinine 0.57 - 1.00 mg/dL 1.65(H) 1.20(H) 1.20(H)  Sodium 134 - 144 mmol/L 138 139 141  Potassium 3.5 - 5.2 mmol/L 5.4(H) 4.9 4.7  Chloride 96 - 106 mmol/L 98 100 105  CO2 20 - 29 mmol/L '25 25 20  ' Calcium 8.7 - 10.3 mg/dL 9.6 9.5 9.3  Total Protein 6.0 - 8.5 g/dL - - -  Total Bilirubin 0.0 - 1.2 mg/dL - - -  Alkaline Phos 39 - 117 IU/L - - -  AST 0 - 40 IU/L - - -  ALT 0 - 32 IU/L - - -    Labs: CMP 12/04/2019 normal  except BUN 18 creatinine 1.1, AST 50, ALT 20, alk phos 69.  Lipase 241.  WBC count 15.8, hemoglobin 14.7, MCV 90, platelets 332.   Carmell Austria, MD 12/28/2019, 4:36 PM  Cc: No ref. provider found

## 2019-12-28 NOTE — Telephone Encounter (Signed)
[facility] Pre-operative Risk Assessment     Request for surgical clearance:     Endoscopy Procedure  What type of surgery is being performed?   Colonoscopy  When is this surgery scheduled?     01/28/20  What type of clearance is required ?   Pharmacy  Are there any medications that need to be held prior to surgery and how long? HOLD PLAVIX FIVE DAYS PRIOR  Practice name and name of physician performing surgery?      [facility]/Dr. Lyndel Safe  What is your office phone and fax number?      Phone- [phone]  Fax- [phone] Attn: Peter Congo, RMA  Anesthesia type (None, local, MAC, general) ?       MAC

## 2019-12-28 NOTE — Patient Instructions (Addendum)
If you are age 74 or older, your body mass index should be between 23-30. Your Body mass index is 25.4 kg/m. If this is out of the aforementioned range listed, please consider follow up with your Primary Care Provider.  If you are age 16 or younger, your body mass index should be between 19-25. Your Body mass index is 25.4 kg/m. If this is out of the aformentioned range listed, please consider follow up with your Primary Care Provider.   You have been scheduled for a colonoscopy. Please follow written instructions given to you at your visit today.  Please pick up your prep supplies at the pharmacy within the next 1-3 days. If you use inhalers (even only as needed), please bring them with you on the day of your procedure. Your physician has requested that you go to www.startemmi.com and enter the access code given to you at your visit today. This web site gives a general overview about your procedure. However, you should still follow specific instructions given to you by our office regarding your preparation for the procedure.  We have sent the following medications to your pharmacy for you to pick up at your convenience: Movantik  Take Miralax 17 grams twice daily.  You will be contacted by our office prior to your procedure for directions on holding your Plavix.  If you do not hear from our office 1 week prior to your scheduled procedure, please call [phone] to discuss.  HOLD IRON FIVE DAYS PRIOR TO PROCEDURE.  Requested records from [facility] to include last CT scan.  Thank you for choosing me and [facility].  Jackquline Denmark, MD

## 2019-12-29 NOTE — Telephone Encounter (Signed)
Tried to call pt s/w daughter Geanie Kenning, she states that they do not want to come here for pre-op appointment. There is nothing available out and [place]. I will send a message to scheduling to call for this appt.

## 2019-12-29 NOTE — Telephone Encounter (Signed)
Called and spoke to patients daughter per dpr got the patient scheduled for a appointment next week in [place].

## 2019-12-29 NOTE — Telephone Encounter (Signed)
Primary Cardiologist:No primary care provider on file.  Chart reviewed as part of pre-operative protocol coverage. Because of Ashley Miles's past medical history and time since last visit, he/she will require a follow-up visit in order to better assess preoperative cardiovascular risk.  Pre-op covering staff: - Please schedule appointment and call patient to inform them. - Please contact requesting surgeon's office via preferred method (i.e, phone, fax) to inform them of need for appointment prior to surgery.  If applicable, this message will also be routed to pharmacy pool and/or primary cardiologist for input on holding anticoagulant/antiplatelet agent as requested below so that this information is available at time of patient's appointment.   Deberah Pelton, NP  12/29/2019, 9:04 AM

## 2019-12-30 DIAGNOSIS — E785 Hyperlipidemia, unspecified: Secondary | ICD-10-CM | POA: Diagnosis not present

## 2019-12-30 DIAGNOSIS — N289 Disorder of kidney and ureter, unspecified: Secondary | ICD-10-CM | POA: Diagnosis not present

## 2019-12-30 DIAGNOSIS — I2510 Atherosclerotic heart disease of native coronary artery without angina pectoris: Secondary | ICD-10-CM | POA: Diagnosis not present

## 2019-12-30 DIAGNOSIS — C3490 Malignant neoplasm of unspecified part of unspecified bronchus or lung: Secondary | ICD-10-CM | POA: Diagnosis not present

## 2019-12-30 DIAGNOSIS — K5900 Constipation, unspecified: Secondary | ICD-10-CM | POA: Diagnosis not present

## 2020-01-06 ENCOUNTER — Other Ambulatory Visit: Payer: Self-pay

## 2020-01-07 ENCOUNTER — Ambulatory Visit (INDEPENDENT_AMBULATORY_CARE_PROVIDER_SITE_OTHER): Payer: Medicare Other | Admitting: Cardiology

## 2020-01-07 ENCOUNTER — Other Ambulatory Visit: Payer: Self-pay

## 2020-01-07 ENCOUNTER — Encounter: Payer: Self-pay | Admitting: Cardiology

## 2020-01-07 VITALS — BP 108/72 | HR 82 | Ht 66.0 in | Wt 165.0 lb

## 2020-01-07 DIAGNOSIS — C3490 Malignant neoplasm of unspecified part of unspecified bronchus or lung: Secondary | ICD-10-CM

## 2020-01-07 DIAGNOSIS — I255 Ischemic cardiomyopathy: Secondary | ICD-10-CM | POA: Diagnosis not present

## 2020-01-07 DIAGNOSIS — E785 Hyperlipidemia, unspecified: Secondary | ICD-10-CM

## 2020-01-07 DIAGNOSIS — I5022 Chronic systolic (congestive) heart failure: Secondary | ICD-10-CM | POA: Diagnosis not present

## 2020-01-07 DIAGNOSIS — IMO0001 Reserved for inherently not codable concepts without codable children: Secondary | ICD-10-CM

## 2020-01-07 DIAGNOSIS — F17200 Nicotine dependence, unspecified, uncomplicated: Secondary | ICD-10-CM

## 2020-01-07 DIAGNOSIS — I2510 Atherosclerotic heart disease of native coronary artery without angina pectoris: Secondary | ICD-10-CM | POA: Diagnosis not present

## 2020-01-07 NOTE — Progress Notes (Signed)
Cardiology Office Note:    Date:  01/07/2020   ID:  Ashley Miles, DOB February 13, 1946, MRN [id number]  PCP:  Ocie Doyne., MD (Inactive)  Cardiologist:  Jenne Campus, MD    Referring MD: No ref. provider found   Chief Complaint  Patient presents with  . Follow-up    Surgery Clearance   Doing well have shortness of breath  History of Present Illness:    Ashley Miles is a 75 y.o. female past medical history significant for coronary artery disease, status post non-STEMI in 2018, cardiomyopathy likely with normalization, noncompliance, smoking, dyslipidemia, lung cancer.  She comes today to my office because she required colonoscopy.  She was find to have significant constipation a lot of pain in her abdomen.  Denies having any chest pain tightness squeezing pressure burning chest.  Her ability to exercise is limited because of shortness of breath which is undoubtedly related to chronic smoking.  Denies having swelling of lower extremities no palpitations no dizziness.  Past Medical History:  Diagnosis Date  . Acute blood loss anemia   . Acute kidney failure ([place])   . Anxiety   . Arthritis   . CHF (congestive heart failure) ([place])   . COPD (chronic obstructive pulmonary disease) ([place])   . Gastric ulcer   . GERD (gastroesophageal reflux disease)   . Hiatal hernia   . Hyperlipidemia   . Hypertension   . Osteopenia   . Small cell lung cancer ([place])   . T12 compression fracture ([place])   . Tobacco use     Past Surgical History:  Procedure Laterality Date  . CESAREAN SECTION    . ESOPHAGOGASTRODUODENOSCOPY  02/17/2017   Healing of the gastic ulcer- no antral scar. Small hiatal hernia  . LEFT HEART CATH AND CORONARY ANGIOGRAPHY N/A 01/09/2017   Procedure: Left Heart Cath and Coronary Angiography;  Surgeon: Burnell Blanks, MD;  Location: [place] [facility];  Service: Cardiovascular;  Laterality: N/A;  . LUNG CANCER SURGERY  2014    Current Medications: Current Meds   Medication Sig  . clopidogrel (PLAVIX) 75 MG tablet Take 1 tablet (75 mg total) by mouth daily.  Marland Kitchen dicyclomine (BENTYL) 10 MG capsule Take 10 mg by mouth every 6 (six) hours.  Marland Kitchen doxepin (SINEQUAN) 10 MG capsule TAKE 1 CAPSULE BY MOUTH AT BEDTIME AS NEEDED FOR SLEEP.  Marland Kitchen doxylamine, Sleep, (UNISOM) 25 MG tablet Take 25 mg by mouth at bedtime as needed.  . Ferrous Sulfate (FEROSUL PO) Take 325 mg by mouth daily.  . metoprolol succinate (TOPROL-XL) 25 MG 24 hr tablet TAKE 1 TABLET BY MOUTH ONCE DAILY.  Marland Kitchen morphine (MS CONTIN) 15 MG 12 hr tablet Take 15 mg by mouth 2 (two) times daily.  Marland Kitchen MOVANTIK 25 MG TABS tablet Take 1 tablet (25 mg total) by mouth daily.  . nitroGLYCERIN (NITROSTAT) 0.4 MG SL tablet Place 1 tablet (0.4 mg total) under the tongue every 5 (five) minutes x 3 doses as needed for chest pain.  Marland Kitchen ondansetron (ZOFRAN) 4 MG tablet Take 4 mg by mouth every 8 (eight) hours as needed for nausea or vomiting.  Marland Kitchen oxyCODONE-acetaminophen (PERCOCET) 10-325 MG tablet Take 1 tablet by mouth every 6 (six) hours as needed for pain.  . polyethylene glycol (MIRALAX / GLYCOLAX) packet Take 17 g by mouth daily.  . potassium chloride (K-DUR) 10 MEQ tablet Take 10 mEq by mouth daily.  . prochlorperazine (COMPAZINE) 10 MG tablet Take 10 mg by mouth 3 (three)  times daily as needed for nausea/vomiting.  . Vitamin D, Ergocalciferol, (DRISDOL) 50000 units CAPS capsule Take 50,000 Units by mouth every 7 (seven) days.     Allergies:   Patient has no known allergies.   Social History   Socioeconomic History  . Marital status: Divorced    Spouse name: Not on file  . Number of children: Not on file  . Years of education: Not on file  . Highest education level: Not on file  Occupational History  . Not on file  Tobacco Use  . Smoking status: Current Every Day Smoker    Types: Cigarettes  . Smokeless tobacco: Never Used  Substance and Sexual Activity  . Alcohol use: No  . Drug use: No  . Sexual  activity: Not on file  Other Topics Concern  . Not on file  Social History Narrative  . Not on file   Social Determinants of Health   Financial Resource Strain:   . Difficulty of Paying Living Expenses:   Food Insecurity:   . Worried About Charity fundraiser in the Last Year:   . Arboriculturist in the Last Year:   Transportation Needs:   . Film/video editor (Medical):   Marland Kitchen Lack of Transportation (Non-Medical):   Physical Activity:   . Days of Exercise per Week:   . Minutes of Exercise per Session:   Stress:   . Feeling of Stress :   Social Connections:   . Frequency of Communication with Friends and Family:   . Frequency of Social Gatherings with Friends and Family:   . Attends Religious Services:   . Active Member of Clubs or Organizations:   . Attends Archivist Meetings:   Marland Kitchen Marital Status:      Family History: The patient's family history includes Blindness in her mother; Cancer in her father; Hypertension in her father. There is no history of Colon cancer or Esophageal cancer. ROS:   Please see the history of present illness.    All 14 point review of systems negative except as described per history of present illness  EKGs/Labs/Other Studies Reviewed:      Recent Labs: No results found for requested labs within last 8760 hours.  Recent Lipid Panel    Component Value Date/Time   CHOL 146 05/21/2018 1134   TRIG 194 (H) 05/21/2018 1134   HDL 47 05/21/2018 1134   CHOLHDL 3.1 05/21/2018 1134   CHOLHDL 5.0 01/11/2017 0521   VLDL 27 01/11/2017 0521   LDLCALC 60 05/21/2018 1134    Physical Exam:    VS:  BP 108/72   Pulse 82   Ht 5\' 6"  (1.676 m)   Wt 165 lb (74.8 kg)   SpO2 97%   BMI 26.63 kg/m     Wt Readings from Last 3 Encounters:  01/07/20 165 lb (74.8 kg)  12/28/19 157 lb 6 oz (71.4 kg)  09/14/18 156 lb 6.4 oz (70.9 kg)     GEN:  Well nourished, well developed in no acute distress HEENT: Normal NECK: No JVD; No carotid  bruits LYMPHATICS: No lymphadenopathy CARDIAC: RRR, no murmurs, no rubs, no gallops RESPIRATORY:  Clear to auscultation without rales, wheezing or rhonchi, poor air entry bilaterally ABDOMEN: Soft, non-tender, non-distended MUSCULOSKELETAL:  No edema; No deformity  SKIN: Warm and dry LOWER EXTREMITIES: no swelling NEUROLOGIC:  Alert and oriented x 3 PSYCHIATRIC:  Normal affect   ASSESSMENT:    1. Ischemic cardiomyopathy   2. Coronary artery  disease involving native coronary artery of native heart without angina pectoris   3. Chronic systolic heart failure ([place])   4. Malignant neoplasm of lung, unspecified laterality, unspecified part of lung ([place])   5. Smoking   6. Dyslipidemia    PLAN:    In order of problems listed above:  1. History of cardiomyopathy which is ischemic.  Last echocardiogram reviewed showed preserved ejection fraction.  Test will be repeated to recheck left ventricle ejection fraction. 2. Coronary artery disease: Denies having recent chest pain.  Her cardiac catheterization from 2018 reviewed.  No multiple lesions.  She is on aspirin as well as statin which I will continue. 3. Dyslipidemia: I do have K PN which are me fasting profile from November 19, 2019 showing LDL 71 and HDL 51.  There is acceptable cholesterol profile. 4. Preop evaluation for colonoscopy.  With her having multiple issues I have some reservation for that procedure.  I think the best way to assess this will be to do echocardiogram to assess left ventricle ejection fraction.  I talked to her gastroenterologist to see if there are any other alternatives to this procedure.   Medication Adjustments/Labs and Tests Ordered: Current medicines are reviewed at length with the patient today.  Concerns regarding medicines are outlined above.  No orders of the defined types were placed in this encounter.  Medication changes: No orders of the defined types were placed in this encounter.   Signed, Park Liter, MD, [facility] 01/07/2020 3:56 PM    [place]

## 2020-01-07 NOTE — Patient Instructions (Signed)
Medication Instructions:  Your physician recommends that you continue on your current medications as directed. Please refer to the Current Medication list given to you today.  *If you need a refill on your cardiac medications before your next appointment, please call your pharmacy*   Lab Work: None.  If you have labs (blood work) drawn today and your tests are completely normal, you will receive your results only by: Marland Kitchen MyChart Message (if you have MyChart) OR . A paper copy in the mail If you have any lab test that is abnormal or we need to change your treatment, we will call you to review the results.   Testing/Procedures: Your physician has requested that you have an echocardiogram. Echocardiography is a painless test that uses sound waves to create images of your heart. It provides your doctor with information about the size and shape of your heart and how well your heart's chambers and valves are working. This procedure takes approximately one hour. There are no restrictions for this procedure.     Follow-Up: At [facility], you and your health needs are our priority.  As part of our continuing mission to provide you with exceptional heart care, we have created designated Provider Care Teams.  These Care Teams include your primary Cardiologist (physician) and Advanced Practice Providers (APPs -  Physician Assistants and Nurse Practitioners) who all work together to provide you with the care you need, when you need it.  We recommend signing up for the patient portal called "MyChart".  Sign up information is provided on this After Visit Summary.  MyChart is used to connect with patients for Virtual Visits (Telemedicine).  Patients are able to view lab/test results, encounter notes, upcoming appointments, etc.  Non-urgent messages can be sent to your provider as well.   To learn more about what you can do with MyChart, go to [email or web address].    Your next appointment:   3  month(s)  The format for your next appointment:   In Person  Provider:   Jenne Campus, MD   Other Instructions

## 2020-01-10 ENCOUNTER — Telehealth: Payer: Self-pay

## 2020-01-10 NOTE — Telephone Encounter (Signed)
Spoke to patient's daughter and was told that we cancelled her procedure due to the fact that she wasn't cleared my cardiac. Per Dr Lyndel Safe I was told to cancel and they have been notified

## 2020-01-14 DIAGNOSIS — C3410 Malignant neoplasm of upper lobe, unspecified bronchus or lung: Secondary | ICD-10-CM | POA: Diagnosis not present

## 2020-01-14 DIAGNOSIS — Z85118 Personal history of other malignant neoplasm of bronchus and lung: Secondary | ICD-10-CM | POA: Diagnosis not present

## 2020-01-14 DIAGNOSIS — D630 Anemia in neoplastic disease: Secondary | ICD-10-CM | POA: Diagnosis not present

## 2020-01-14 DIAGNOSIS — M8589 Other specified disorders of bone density and structure, multiple sites: Secondary | ICD-10-CM | POA: Diagnosis not present

## 2020-01-14 DIAGNOSIS — R918 Other nonspecific abnormal finding of lung field: Secondary | ICD-10-CM | POA: Diagnosis not present

## 2020-01-14 DIAGNOSIS — C3412 Malignant neoplasm of upper lobe, left bronchus or lung: Secondary | ICD-10-CM | POA: Diagnosis not present

## 2020-01-20 DIAGNOSIS — R300 Dysuria: Secondary | ICD-10-CM | POA: Diagnosis not present

## 2020-01-20 DIAGNOSIS — N289 Disorder of kidney and ureter, unspecified: Secondary | ICD-10-CM | POA: Diagnosis not present

## 2020-01-20 DIAGNOSIS — E538 Deficiency of other specified B group vitamins: Secondary | ICD-10-CM | POA: Diagnosis not present

## 2020-01-26 ENCOUNTER — Other Ambulatory Visit: Payer: Medicare Other

## 2020-01-27 ENCOUNTER — Other Ambulatory Visit: Payer: Medicare Other

## 2020-01-28 ENCOUNTER — Encounter: Payer: Medicare Other | Admitting: Gastroenterology

## 2020-02-07 ENCOUNTER — Other Ambulatory Visit: Payer: Medicare Other

## 2020-02-09 ENCOUNTER — Ambulatory Visit (INDEPENDENT_AMBULATORY_CARE_PROVIDER_SITE_OTHER): Payer: Medicare Other

## 2020-02-09 ENCOUNTER — Other Ambulatory Visit: Payer: Self-pay

## 2020-02-09 DIAGNOSIS — I255 Ischemic cardiomyopathy: Secondary | ICD-10-CM

## 2020-02-09 DIAGNOSIS — I5022 Chronic systolic (congestive) heart failure: Secondary | ICD-10-CM | POA: Diagnosis not present

## 2020-02-09 NOTE — Progress Notes (Signed)
Complete echocardiogram performed.  Jimmy Rel RDCS, RVT

## 2020-02-10 ENCOUNTER — Telehealth: Payer: Self-pay | Admitting: Cardiology

## 2020-02-10 NOTE — Telephone Encounter (Signed)
Ashley Miles is returning Ashley Miles's call in regards to Echo results.

## 2020-02-10 NOTE — Telephone Encounter (Signed)
See result note.  

## 2020-02-29 DIAGNOSIS — K922 Gastrointestinal hemorrhage, unspecified: Secondary | ICD-10-CM | POA: Diagnosis not present

## 2020-02-29 DIAGNOSIS — M549 Dorsalgia, unspecified: Secondary | ICD-10-CM | POA: Diagnosis not present

## 2020-02-29 DIAGNOSIS — E785 Hyperlipidemia, unspecified: Secondary | ICD-10-CM | POA: Diagnosis not present

## 2020-02-29 DIAGNOSIS — E559 Vitamin D deficiency, unspecified: Secondary | ICD-10-CM | POA: Diagnosis not present

## 2020-02-29 DIAGNOSIS — Z8673 Personal history of transient ischemic attack (TIA), and cerebral infarction without residual deficits: Secondary | ICD-10-CM | POA: Diagnosis not present

## 2020-03-02 DIAGNOSIS — E538 Deficiency of other specified B group vitamins: Secondary | ICD-10-CM | POA: Diagnosis not present

## 2020-03-02 DIAGNOSIS — E785 Hyperlipidemia, unspecified: Secondary | ICD-10-CM | POA: Diagnosis not present

## 2020-03-02 DIAGNOSIS — D649 Anemia, unspecified: Secondary | ICD-10-CM | POA: Diagnosis not present

## 2020-04-03 DIAGNOSIS — E538 Deficiency of other specified B group vitamins: Secondary | ICD-10-CM | POA: Diagnosis not present

## 2020-04-03 DIAGNOSIS — N289 Disorder of kidney and ureter, unspecified: Secondary | ICD-10-CM | POA: Diagnosis not present

## 2020-05-03 ENCOUNTER — Ambulatory Visit (INDEPENDENT_AMBULATORY_CARE_PROVIDER_SITE_OTHER): Payer: Medicare Other | Admitting: Cardiology

## 2020-05-03 ENCOUNTER — Other Ambulatory Visit: Payer: Self-pay

## 2020-05-03 ENCOUNTER — Encounter: Payer: Self-pay | Admitting: Cardiology

## 2020-05-03 VITALS — BP 122/80 | HR 76 | Ht 66.0 in | Wt 168.0 lb

## 2020-05-03 DIAGNOSIS — IMO0001 Reserved for inherently not codable concepts without codable children: Secondary | ICD-10-CM

## 2020-05-03 DIAGNOSIS — F17200 Nicotine dependence, unspecified, uncomplicated: Secondary | ICD-10-CM | POA: Diagnosis not present

## 2020-05-03 DIAGNOSIS — E785 Hyperlipidemia, unspecified: Secondary | ICD-10-CM

## 2020-05-03 DIAGNOSIS — I2510 Atherosclerotic heart disease of native coronary artery without angina pectoris: Secondary | ICD-10-CM

## 2020-05-03 DIAGNOSIS — I739 Peripheral vascular disease, unspecified: Secondary | ICD-10-CM | POA: Diagnosis not present

## 2020-05-03 DIAGNOSIS — I255 Ischemic cardiomyopathy: Secondary | ICD-10-CM

## 2020-05-03 NOTE — Patient Instructions (Signed)
Medication Instructions:  Your physician recommends that you continue on your current medications as directed. Please refer to the Current Medication list given to you today.  *If you need a refill on your cardiac medications before your next appointment, please call your pharmacy*   Lab Work: None.  If you have labs (blood work) drawn today and your tests are completely normal, you will receive your results only by: Marland Kitchen MyChart Message (if you have MyChart) OR . A paper copy in the mail If you have any lab test that is abnormal or we need to change your treatment, we will call you to review the results.   Testing/Procedures: You will have a lower extremity ultrasound.    Follow-Up: At [facility], you and your health needs are our priority.  As part of our continuing mission to provide you with exceptional heart care, we have created designated Provider Care Teams.  These Care Teams include your primary Cardiologist (physician) and Advanced Practice Providers (APPs -  Physician Assistants and Nurse Practitioners) who all work together to provide you with the care you need, when you need it.  We recommend signing up for the patient portal called "MyChart".  Sign up information is provided on this After Visit Summary.  MyChart is used to connect with patients for Virtual Visits (Telemedicine).  Patients are able to view lab/test results, encounter notes, upcoming appointments, etc.  Non-urgent messages can be sent to your provider as well.   To learn more about what you can do with MyChart, go to [email or web address].    Your next appointment:   6 month(s)  The format for your next appointment:   In Person  Provider:   Jenne Campus, MD   Other Instructions

## 2020-05-03 NOTE — Addendum Note (Signed)
Addended by: Senaida Ores on: 05/03/2020 03:08 PM   Modules accepted: Orders

## 2020-05-03 NOTE — Progress Notes (Signed)
Cardiology Office Note:    Date:  05/03/2020   ID:  Ashley Miles, DOB 04-10-1946, MRN [id number]  PCP:  Penelope Coop, FNP  Cardiologist:  Jenne Campus, MD    Referring MD: No ref. provider found   Chief Complaint  Patient presents with  . Follow-up  I am doing fine  History of Present Illness:    Ashley Miles is a 74 y.o. female with past medical history significant for coronary artery disease, status post non-STEMI in 2018, at the same time she did have cardiomyopathy with reduced ejection fraction however normalized, smoking, dyslipidemia, lung cancer, noncompliance.  She comes today 2 months of follow-up last time I seen her before a potential colonoscopy.  Colonoscopy was planned because of constipation.  She did have echocardiogram done which showed preserved left ventricle ejection fraction.  She denies have any chest pain tightness squeezing pressure burning chest.  Still smokes about 2 packs/day.  She said when she walks she will get tiredness and fatigue in her legs not much chest pain she also have shortness of breath while walking.  Past Medical History:  Diagnosis Date  . Acute blood loss anemia   . Acute kidney failure ([place])   . Anxiety   . Arthritis   . CHF (congestive heart failure) ([place])   . COPD (chronic obstructive pulmonary disease) ([place])   . Gastric ulcer   . GERD (gastroesophageal reflux disease)   . Hiatal hernia   . Hyperlipidemia   . Hypertension   . Osteopenia   . Small cell lung cancer ([place])   . T12 compression fracture ([place])   . Tobacco use     Past Surgical History:  Procedure Laterality Date  . CESAREAN SECTION    . ESOPHAGOGASTRODUODENOSCOPY  02/17/2017   Healing of the gastic ulcer- no antral scar. Small hiatal hernia  . LEFT HEART CATH AND CORONARY ANGIOGRAPHY N/A 01/09/2017   Procedure: Left Heart Cath and Coronary Angiography;  Surgeon: Burnell Blanks, MD;  Location: [place] [facility];  Service: Cardiovascular;   Laterality: N/A;  . LUNG CANCER SURGERY  2014    Current Medications: Current Meds  Medication Sig  . buPROPion (WELLBUTRIN SR) 150 MG 12 hr tablet Take 150 mg by mouth 2 (two) times daily.  . busPIRone (BUSPAR) 15 MG tablet TAKE 1/2 TABLET BY MOUTH TWICE DAILY FOR 1 WEEK, IF NEEDED INCREASE TO 1 TABLET TWICE DAILY FOR WEEK 2, IF NEEDED INCREASE TO 2 TABS TWICE D  . clopidogrel (PLAVIX) 75 MG tablet Take 1 tablet (75 mg total) by mouth daily.  Marland Kitchen dicyclomine (BENTYL) 10 MG capsule Take 10 mg by mouth every 6 (six) hours.  Marland Kitchen doxepin (SINEQUAN) 10 MG capsule TAKE 1 CAPSULE BY MOUTH AT BEDTIME AS NEEDED FOR SLEEP.  Marland Kitchen doxylamine, Sleep, (UNISOM) 25 MG tablet Take 25 mg by mouth at bedtime as needed.  . ezetimibe (ZETIA) 10 MG tablet Take 1 tablet (10 mg total) by mouth daily.  . Ferrous Sulfate (FEROSUL PO) Take 325 mg by mouth daily.  Marland Kitchen lubiprostone (AMITIZA) 24 MCG capsule Take 24 mcg by mouth 2 (two) times daily with a meal.  . metoprolol succinate (TOPROL-XL) 25 MG 24 hr tablet TAKE 1 TABLET BY MOUTH ONCE DAILY.  Marland Kitchen morphine (MS CONTIN) 15 MG 12 hr tablet Take 15 mg by mouth 2 (two) times daily.  Marland Kitchen MOVANTIK 25 MG TABS tablet Take 1 tablet (25 mg total) by mouth daily.  . nitroGLYCERIN (NITROSTAT) 0.4 MG  SL tablet Place 1 tablet (0.4 mg total) under the tongue every 5 (five) minutes x 3 doses as needed for chest pain.  Marland Kitchen ondansetron (ZOFRAN) 4 MG tablet Take 4 mg by mouth every 8 (eight) hours as needed for nausea or vomiting.  Marland Kitchen oxyCODONE-acetaminophen (PERCOCET) 10-325 MG tablet Take 1 tablet by mouth every 6 (six) hours as needed for pain.  . pantoprazole (PROTONIX) 40 MG tablet Take 40 mg by mouth 2 (two) times daily.  . polyethylene glycol (MIRALAX / GLYCOLAX) packet Take 17 g by mouth daily.  . potassium chloride (K-DUR) 10 MEQ tablet Take 10 mEq by mouth daily.  . prochlorperazine (COMPAZINE) 10 MG tablet Take 10 mg by mouth 3 (three) times daily as needed for nausea/vomiting.  .  rosuvastatin (CRESTOR) 10 MG tablet Take 1 tablet by mouth daily.  . Vitamin D, Ergocalciferol, (DRISDOL) 50000 units CAPS capsule Take 50,000 Units by mouth every 7 (seven) days.     Allergies:   Patient has no known allergies.   Social History   Socioeconomic History  . Marital status: Divorced    Spouse name: Not on file  . Number of children: Not on file  . Years of education: Not on file  . Highest education level: Not on file  Occupational History  . Not on file  Tobacco Use  . Smoking status: Current Every Day Smoker    Types: Cigarettes  . Smokeless tobacco: Never Used  Vaping Use  . Vaping Use: Former  Substance and Sexual Activity  . Alcohol use: No  . Drug use: No  . Sexual activity: Not on file  Other Topics Concern  . Not on file  Social History Narrative  . Not on file   Social Determinants of Health   Financial Resource Strain:   . Difficulty of Paying Living Expenses: Not on file  Food Insecurity:   . Worried About Charity fundraiser in the Last Year: Not on file  . Ran Out of Food in the Last Year: Not on file  Transportation Needs:   . Lack of Transportation (Medical): Not on file  . Lack of Transportation (Non-Medical): Not on file  Physical Activity:   . Days of Exercise per Week: Not on file  . Minutes of Exercise per Session: Not on file  Stress:   . Feeling of Stress : Not on file  Social Connections:   . Frequency of Communication with Friends and Family: Not on file  . Frequency of Social Gatherings with Friends and Family: Not on file  . Attends Religious Services: Not on file  . Active Member of Clubs or Organizations: Not on file  . Attends Archivist Meetings: Not on file  . Marital Status: Not on file     Family History: The patient's family history includes Blindness in her mother; Cancer in her father; Hypertension in her father. There is no history of Colon cancer or Esophageal cancer. ROS:   Please see the history  of present illness.    All 14 point review of systems negative except as described per history of present illness  EKGs/Labs/Other Studies Reviewed:      Recent Labs: No results found for requested labs within last 8760 hours.  Recent Lipid Panel    Component Value Date/Time   CHOL 146 05/21/2018 1134   TRIG 194 (H) 05/21/2018 1134   HDL 47 05/21/2018 1134   CHOLHDL 3.1 05/21/2018 1134   CHOLHDL 5.0 01/11/2017 0521  VLDL 27 01/11/2017 0521   LDLCALC 60 05/21/2018 1134    Physical Exam:    VS:  BP 122/80 (BP Location: Left Arm, Patient Position: Sitting, Cuff Size: Normal)   Pulse 76   Ht 5\' 6"  (1.676 m)   Wt 168 lb (76.2 kg)   SpO2 97%   BMI 27.12 kg/m     Wt Readings from Last 3 Encounters:  05/03/20 168 lb (76.2 kg)  01/07/20 165 lb (74.8 kg)  12/28/19 157 lb 6 oz (71.4 kg)     GEN:  Well nourished, well developed in no acute distress HEENT: Normal NECK: No JVD; No carotid bruits LYMPHATICS: No lymphadenopathy CARDIAC: RRR, no murmurs, no rubs, no gallops RESPIRATORY: Poor air entry bilaterally, few wheezes ABDOMEN: Soft, non-tender, non-distended MUSCULOSKELETAL:  No edema; No deformity  SKIN: Warm and dry LOWER EXTREMITIES: no swelling NEUROLOGIC:  Alert and oriented x 3 PSYCHIATRIC:  Normal affect   ASSESSMENT:    1. Ischemic cardiomyopathy   2. Coronary artery disease involving native coronary artery of native heart without angina pectoris   3. Smoking   4. Dyslipidemia    PLAN:    In order of problems listed above:  1. Ischemic cardiomyopathy last echocardiogram showed preserved left ventricle ejection fraction.  She is on appropriate medications which I will continue. 2. Dyslipidemia: I did review her K PN from 29 July last year showing LDL of 60 HDL 57.  We will continue present management which include moderate intensity statin in form of Crestor 10 mg daily. 3. She does have pain in her legs while walking I suspect claudication.  We will do  arterial duplex of lower extremities. 4. Smoking obviously still a problem we spent at least 5 minutes talking about need to quit.   Medication Adjustments/Labs and Tests Ordered: Current medicines are reviewed at length with the patient today.  Concerns regarding medicines are outlined above.  No orders of the defined types were placed in this encounter.  Medication changes: No orders of the defined types were placed in this encounter.   Signed, Park Liter, MD, [facility] 05/03/2020 2:58 PM    [place]

## 2020-05-04 ENCOUNTER — Ambulatory Visit (INDEPENDENT_AMBULATORY_CARE_PROVIDER_SITE_OTHER): Payer: Medicare Other

## 2020-05-04 DIAGNOSIS — I739 Peripheral vascular disease, unspecified: Secondary | ICD-10-CM

## 2020-05-04 NOTE — Progress Notes (Addendum)
Lower extremity arterial duplex exam performed.Laverna Peace Zamariya Neal RDCS, RVT

## 2020-06-13 DIAGNOSIS — Z9181 History of falling: Secondary | ICD-10-CM | POA: Diagnosis not present

## 2020-06-13 DIAGNOSIS — R252 Cramp and spasm: Secondary | ICD-10-CM | POA: Diagnosis not present

## 2020-06-13 DIAGNOSIS — Z1231 Encounter for screening mammogram for malignant neoplasm of breast: Secondary | ICD-10-CM | POA: Diagnosis not present

## 2020-06-13 DIAGNOSIS — E559 Vitamin D deficiency, unspecified: Secondary | ICD-10-CM | POA: Diagnosis not present

## 2020-06-13 DIAGNOSIS — E785 Hyperlipidemia, unspecified: Secondary | ICD-10-CM | POA: Diagnosis not present

## 2020-06-13 DIAGNOSIS — E538 Deficiency of other specified B group vitamins: Secondary | ICD-10-CM | POA: Diagnosis not present

## 2020-06-13 DIAGNOSIS — D649 Anemia, unspecified: Secondary | ICD-10-CM | POA: Diagnosis not present

## 2020-06-19 ENCOUNTER — Other Ambulatory Visit: Payer: Self-pay

## 2020-06-19 DIAGNOSIS — Z8739 Personal history of other diseases of the musculoskeletal system and connective tissue: Secondary | ICD-10-CM

## 2020-06-19 DIAGNOSIS — C3412 Malignant neoplasm of upper lobe, left bronchus or lung: Secondary | ICD-10-CM

## 2020-06-19 MED ORDER — MORPHINE SULFATE ER 15 MG PO TBCR: 15.0000 mg | EXTENDED_RELEASE_TABLET | Freq: Two times a day (BID) | ORAL | 0 refills | Status: DC

## 2020-06-19 MED ORDER — OXYCODONE-ACETAMINOPHEN 10-325 MG PO TABS: 1.0000 | ORAL_TABLET | Freq: Four times a day (QID) | ORAL | 0 refills | Status: DC | PRN

## 2020-06-26 DIAGNOSIS — Z85118 Personal history of other malignant neoplasm of bronchus and lung: Secondary | ICD-10-CM | POA: Diagnosis not present

## 2020-06-26 DIAGNOSIS — J439 Emphysema, unspecified: Secondary | ICD-10-CM | POA: Diagnosis not present

## 2020-06-26 DIAGNOSIS — I313 Pericardial effusion (noninflammatory): Secondary | ICD-10-CM | POA: Diagnosis not present

## 2020-06-26 DIAGNOSIS — C3412 Malignant neoplasm of upper lobe, left bronchus or lung: Secondary | ICD-10-CM | POA: Diagnosis not present

## 2020-06-26 DIAGNOSIS — J8410 Pulmonary fibrosis, unspecified: Secondary | ICD-10-CM | POA: Diagnosis not present

## 2020-06-26 DIAGNOSIS — I700 Atherosclerosis of aorta: Secondary | ICD-10-CM | POA: Diagnosis not present

## 2020-06-26 DIAGNOSIS — I2510 Atherosclerotic heart disease of native coronary artery without angina pectoris: Secondary | ICD-10-CM | POA: Diagnosis not present

## 2020-06-27 ENCOUNTER — Telehealth: Payer: Self-pay

## 2020-06-27 NOTE — Telephone Encounter (Addendum)
I spoke with pts daughter,Kim, & notified her of message below from Dr Hinton Rao. She will relay info to her mom. [phone].   ----- Message from Derwood Kaplan, MD sent at 06/26/2020  4:14 PM EST ----- Regarding: call pt. Tell her CT stable, just scar tissue.  Labs okay, will review at appt.

## 2020-07-07 DIAGNOSIS — Z1231 Encounter for screening mammogram for malignant neoplasm of breast: Secondary | ICD-10-CM | POA: Diagnosis not present

## 2020-07-10 NOTE — Progress Notes (Signed)
[facility]  [address] [place],  [place]  [address] [phone]  Clinic Day:  07/11/2020  Referring physician: Penelope Coop, FNP   This document serves as a record of services personally performed by Hosie Poisson, MD. It was created on their behalf by Curry,Lauren E, a trained medical scribe. The creation of this record is based on the scribe's personal observations and the provider's statements to them.   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 She was treated with concurrent radiation and chemotherapy, which included 6 cycles of carboplatin and Etoposide, that was completed in October 2014. She has restaging scans every 3 months that have remained stable.  A PET scan in March 2015 showed only mild to moderate metabolic activity in the area of her original tumor.  She does have degenerative disc disease and osteoarthritis with chronic bone pain.  We have agreed to prescribe pain medication for her as she is unable to obtain a primary care provider.  In addition, she has significant osteopenia and was placed on Fosamax along with calcium and vitamin-D.  She continues to complain of lower back pain, and rates it as an 8 and constant..  She states the Percocet 10/325 gives her relief but just for a short period of time.  In June of 2017, she was complaining of a catch under both breasts and some increased cough with occasional chest pain.  At that time her CT scan did show progressive collapse and consolidation of the left upper lobe and so a PET scan was done to evaluate this further.  This revealed mild hypermetabolism within the posterior left upper hemithorax consistent with radiation treatment  changes and appeared relatively uniform with no areas of intense uptake suspicious for malignancy.  In December of 2017, she had a rib fracture that was found on x-ray at urgent care. She was also put on MS Contin 15mg  bid. She was seen by Dr. Jackquline Denmark and found to have a large gastric ulcer with bleeding.  She was placed on Protonix twice daily and had a follow-up upper endoscopy in July of 2018, which showed good healing of her ulcer, now consistent with an antral scar, and a small hiatal hernia. .  When I saw her in April, her hemoglobin was up to 11.3 but was back down to 9.1 on July 16th.  Biopsies revealed a reactive gastropathy and was negative for H pylori.  She is on iron supplement. She has had 2 myocardial infarctions, one in March of 2019 and another in June, and was transferred to [facility] in [place].  A chest x-ray done at that time showed just a chronic opacity at the left lung apex consistent with her treated lung cancer.  She also had an echocardiogram which revealed severe impairment of her left ventricular function with an ejection fraction of 30-35%.  She had akinesis of multiple areas of the myocardium.  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  It also revealed a small stable pericardial effusion.  ECHO from July 2021 revealed a good ejection fraction of 60-65% and small  stable pericardial effusion.   INTERVAL HISTORY:  Ashley Miles is here for routine follow up and states that she has been fairly well.  CT chest from November revealed stable dense radiation fibrosis involving the left paramediastinal lung and left hilum with no findings suspicious for recurrent tumor.  Stable emphysematous changes and pulmonary scarring and stable 3 mm subpleural right lower lobe pulmonary nodule, likely benign lymph node, was also seen.  She denies shortness of  breath or dyspnea.  Annual mammogram from December 3rd was negative.  Her  appetite is good, and she has gained 10 pounds since her last visit.  She denies fever, chills or other signs of infection.  She denies nausea, vomiting, bowel issues, or abdominal pain.  She denies sore throat, cough, dyspnea, or chest pain.   REVIEW OF SYSTEMS:  Review of Systems  Constitutional: Negative.   HENT:  Negative.   Eyes: Negative.   Respiratory: Negative.   Cardiovascular: Negative.   Gastrointestinal: Negative.   Endocrine: Negative.   Genitourinary: Negative.    Musculoskeletal: Negative.   Skin: Negative.   Neurological: Negative.   Hematological: Negative.   Psychiatric/Behavioral: Negative.      VITALS:  Blood pressure (!) 162/70, pulse 69, temperature 97.9 F (36.6 C), temperature source Oral, resp. rate 18, height 5\' 6"  (1.676 m), weight 168 lb 8 oz (76.4 kg), SpO2 96 %.  Wt Readings from Last 3 Encounters:  07/11/20 168 lb 8 oz (76.4 kg)  05/03/20 168 lb (76.2 kg)  01/07/20 165 lb (74.8 kg)    Body mass index is 27.2 kg/m.  Performance status (ECOG): 0 - Asymptomatic  PHYSICAL EXAM:  Physical Exam Constitutional:      General: She is not in acute distress.    Appearance: Normal appearance. She is normal weight.  HENT:     Head: Normocephalic and atraumatic.  Eyes:     General: No scleral icterus.    Extraocular Movements: Extraocular movements intact.     Conjunctiva/sclera: Conjunctivae normal.     Pupils: Pupils are equal, round, and reactive to light.  Cardiovascular:     Rate and Rhythm: Normal rate and regular rhythm.     Pulses: Normal pulses.     Heart sounds: Normal heart sounds. No murmur heard.  No friction rub. No gallop.   Pulmonary:     Effort: Pulmonary effort is normal. No respiratory distress.     Breath sounds: Wheezing (slight, at the right base) present.  Abdominal:     General: Bowel sounds are normal. There is no distension.     Palpations:  Abdomen is soft. There is no mass.     Tenderness: There is no abdominal tenderness.  Musculoskeletal:        General: Normal range of motion.     Cervical back: Normal range of motion and neck supple.     Right lower leg: No edema.     Left lower leg: No edema.  Lymphadenopathy:     Cervical: No cervical adenopathy.  Skin:    General: Skin is warm and dry.  Neurological:     General: No focal deficit present.     Mental Status: She is alert and oriented to person, place, and time. Mental status is at baseline.  Psychiatric:        Mood and Affect: Mood normal.        Behavior: Behavior normal.        Thought Content: Thought content normal.  Judgment: Judgment normal.     LABS:   CBC Latest Ref Rng & Units 02/03/2017 01/13/2017 01/12/2017  WBC 3.4 - 10.8 x10E3/uL 8.2 8.6 10.6(H)  Hemoglobin 11.1 - 15.9 g/dL 10.7(L) 10.0(L) 10.6(L)  Hematocrit 34.0 - 46.6 % 34.1 32.0(L) 33.3(L)  Platelets 150 - 379 x10E3/uL 406(H) 324 312   CMP Latest Ref Rng & Units 05/21/2018 05/28/2017 04/03/2017  Glucose 65 - 99 mg/dL 90 103(H) 109(H)  BUN 8 - 27 mg/dL 14 10 11   Creatinine 0.57 - 1.00 mg/dL 1.65(H) 1.20(H) 1.20(H)  Sodium 134 - 144 mmol/L 138 139 141  Potassium 3.5 - 5.2 mmol/L 5.4(H) 4.9 4.7  Chloride 96 - 106 mmol/L 98 100 105  CO2 20 - 29 mmol/L 25 25 20   Calcium 8.7 - 10.3 mg/dL 9.6 9.5 9.3  Total Protein 6.0 - 8.5 g/dL - - -  Total Bilirubin 0.0 - 1.2 mg/dL - - -  Alkaline Phos 39 - 117 IU/L - - -  AST 0 - 40 IU/L - - -  ALT 0 - 32 IU/L - - -     No results found for: CEA1 / No results found for: CEA1   STUDIES:   She underwent a CT chest with contrast on 06/26/2020 showing: 1. Stable dense radiation fibrosis involving the left paramediastinal lung and left hilum. No findings suspicious for recurrent tumor. 2. No mediastinal or hilar mass or adenopathy. 3. Stable emphysematous changes and pulmonary scarring. 4. Stable 3 mm subpleural right lower lobe pulmonary nodule,  likely benign lymph node. 5. Stable small to moderate pericardial effusion. 6. Stable advanced atherosclerotic calcifications involving the thoracic and abdominal aorta and branch vessels including the coronary arteries. 7. Stable small adrenal gland nodules, likely benign adenomas. 8. Stable mild intra and extrahepatic biliary dilatation.  She underwent a digital screening bilateral mammogram on 07/07/2020 showing: breast density category B.  No mammographic evidence of malignancy.  Allergies: No Known Allergies  Current Medications: Current Outpatient Medications  Medication Sig Dispense Refill  . buPROPion (WELLBUTRIN SR) 150 MG 12 hr tablet Take 150 mg by mouth 2 (two) times daily.    . busPIRone (BUSPAR) 15 MG tablet TAKE 1/2 TABLET BY MOUTH TWICE DAILY FOR 1 WEEK, IF NEEDED INCREASE TO 1 TABLET TWICE DAILY FOR WEEK 2, IF NEEDED INCREASE TO 2 TABS TWICE D    . clopidogrel (PLAVIX) 75 MG tablet Take 1 tablet (75 mg total) by mouth daily. 30 tablet 6  . dicyclomine (BENTYL) 10 MG capsule Take 10 mg by mouth every 6 (six) hours.    Marland Kitchen doxepin (SINEQUAN) 10 MG capsule TAKE 1 CAPSULE BY MOUTH AT BEDTIME AS NEEDED FOR SLEEP.    Marland Kitchen doxylamine, Sleep, (UNISOM) 25 MG tablet Take 25 mg by mouth at bedtime as needed.    . ezetimibe (ZETIA) 10 MG tablet Take 1 tablet (10 mg total) by mouth daily. 30 tablet 6  . Ferrous Sulfate (FEROSUL PO) Take 325 mg by mouth daily.    Marland Kitchen lubiprostone (AMITIZA) 24 MCG capsule Take 24 mcg by mouth 2 (two) times daily with a meal.    . metoprolol succinate (TOPROL-XL) 25 MG 24 hr tablet TAKE 1 TABLET BY MOUTH ONCE DAILY. 30 tablet 0  . morphine (MS CONTIN) 15 MG 12 hr tablet Take 1 tablet (15 mg total) by mouth every 12 (twelve) hours. 60 tablet 0  . MOVANTIK 25 MG TABS tablet Take 1 tablet (25 mg total) by mouth daily. 30 tablet 6  . nitroGLYCERIN (  NITROSTAT) 0.4 MG SL tablet Place 1 tablet (0.4 mg total) under the tongue every 5 (five) minutes x 3 doses as needed for  chest pain. 25 tablet 12  . ondansetron (ZOFRAN) 4 MG tablet Take 4 mg by mouth every 8 (eight) hours as needed for nausea or vomiting.    Marland Kitchen oxyCODONE-acetaminophen (PERCOCET) 10-325 MG tablet Take 1 tablet by mouth every 6 (six) hours as needed for pain. 120 tablet 0  . pantoprazole (PROTONIX) 40 MG tablet Take 40 mg by mouth 2 (two) times daily.    . polyethylene glycol (MIRALAX / GLYCOLAX) packet Take 17 g by mouth daily.    . potassium chloride (K-DUR) 10 MEQ tablet Take 10 mEq by mouth daily.    . prochlorperazine (COMPAZINE) 10 MG tablet Take 10 mg by mouth 3 (three) times daily as needed for nausea/vomiting.    . rosuvastatin (CRESTOR) 10 MG tablet Take 1 tablet by mouth daily.    Marland Kitchen tiZANidine (ZANAFLEX) 4 MG tablet Take 4 mg by mouth at bedtime as needed.    . Vitamin D, Ergocalciferol, (DRISDOL) 50000 units CAPS capsule Take 50,000 Units by mouth every 7 (seven) days.     No current facility-administered medications for this visit.     ASSESSMENT & PLAN:   Assessment:   1.  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  This was treated with chemotherapy and radiation.  She remains without evidence of disease.    2.  Tiny stable pulmonary nodules.  3.  Chronic back pain from degenerative disc disease and osteoarthritis.  4.  Stable small to moderate pericardial effusion.  ECHO from July 2021 revealed an EF between 60-65%.  Plan: CT imaging remains stable. I will see her back in 6 months with CBC, comprehensive metabolic profile, CEA for examination and port flush.  She understands and agrees with this plan of care.   I provided 15 minutes of face-to-face time during this this encounter and > 50% was spent counseling as documented under my assessment and plan.    Derwood Kaplan, MD [facility] [address] [place] [address] Dept: [phone] Dept Fax:  [phone]   I, Rita Ohara, am acting as scribe for Derwood Kaplan, MD  I have reviewed this report as typed by the medical scribe, and it is complete and accurate.

## 2020-07-11 ENCOUNTER — Encounter: Payer: Self-pay | Admitting: Oncology

## 2020-07-11 ENCOUNTER — Inpatient Hospital Stay: Payer: Medicare Other | Attending: Oncology | Admitting: Oncology

## 2020-07-11 ENCOUNTER — Other Ambulatory Visit: Payer: Self-pay | Admitting: Oncology

## 2020-07-11 ENCOUNTER — Other Ambulatory Visit: Payer: Self-pay

## 2020-07-11 VITALS — BP 162/70 | HR 69 | Temp 97.9°F | Resp 18 | Ht 66.0 in | Wt 168.5 lb

## 2020-07-11 DIAGNOSIS — I313 Pericardial effusion (noninflammatory): Secondary | ICD-10-CM

## 2020-07-11 DIAGNOSIS — C3412 Malignant neoplasm of upper lobe, left bronchus or lung: Secondary | ICD-10-CM | POA: Diagnosis not present

## 2020-07-11 DIAGNOSIS — D539 Nutritional anemia, unspecified: Secondary | ICD-10-CM

## 2020-07-11 DIAGNOSIS — I3139 Other pericardial effusion (noninflammatory): Secondary | ICD-10-CM

## 2020-07-11 DIAGNOSIS — D649 Anemia, unspecified: Secondary | ICD-10-CM | POA: Diagnosis not present

## 2020-07-11 HISTORY — DX: Anemia, unspecified: D64.9

## 2020-07-11 HISTORY — DX: Nutritional anemia, unspecified: D53.9

## 2020-07-14 DIAGNOSIS — E538 Deficiency of other specified B group vitamins: Secondary | ICD-10-CM | POA: Diagnosis not present

## 2020-07-19 ENCOUNTER — Encounter: Payer: Self-pay | Admitting: Oncology

## 2020-07-19 ENCOUNTER — Other Ambulatory Visit: Payer: Self-pay

## 2020-07-19 DIAGNOSIS — Z8739 Personal history of other diseases of the musculoskeletal system and connective tissue: Secondary | ICD-10-CM

## 2020-07-19 MED ORDER — OXYCODONE-ACETAMINOPHEN 10-325 MG PO TABS: 1.0000 | ORAL_TABLET | Freq: Four times a day (QID) | ORAL | 0 refills | Status: DC | PRN

## 2020-07-19 MED ORDER — MORPHINE SULFATE ER 15 MG PO TBCR: 15.0000 mg | EXTENDED_RELEASE_TABLET | Freq: Two times a day (BID) | ORAL | 0 refills | Status: DC

## 2020-08-06 ENCOUNTER — Emergency Department (HOSPITAL_COMMUNITY)
Admission: EM | Admit: 2020-08-06 | Discharge: 2020-08-07 | Disposition: A | Discharge status: Home / Self Care | Payer: Medicare Other | Attending: Emergency Medicine | Admitting: Emergency Medicine

## 2020-08-06 DIAGNOSIS — Z20822 Contact with and (suspected) exposure to covid-19: Secondary | ICD-10-CM | POA: Diagnosis not present

## 2020-08-06 DIAGNOSIS — R0902 Hypoxemia: Secondary | ICD-10-CM | POA: Diagnosis not present

## 2020-08-06 DIAGNOSIS — Z79899 Other long term (current) drug therapy: Secondary | ICD-10-CM | POA: Insufficient documentation

## 2020-08-06 DIAGNOSIS — R059 Cough, unspecified: Secondary | ICD-10-CM | POA: Diagnosis not present

## 2020-08-06 DIAGNOSIS — R110 Nausea: Secondary | ICD-10-CM | POA: Diagnosis not present

## 2020-08-06 DIAGNOSIS — K838 Other specified diseases of biliary tract: Secondary | ICD-10-CM | POA: Diagnosis not present

## 2020-08-06 DIAGNOSIS — I110 Hypertensive heart disease with heart failure: Secondary | ICD-10-CM | POA: Insufficient documentation

## 2020-08-06 DIAGNOSIS — F17210 Nicotine dependence, cigarettes, uncomplicated: Secondary | ICD-10-CM | POA: Diagnosis not present

## 2020-08-06 DIAGNOSIS — Z85118 Personal history of other malignant neoplasm of bronchus and lung: Secondary | ICD-10-CM | POA: Diagnosis not present

## 2020-08-06 DIAGNOSIS — I5023 Acute on chronic systolic (congestive) heart failure: Secondary | ICD-10-CM | POA: Diagnosis not present

## 2020-08-06 DIAGNOSIS — R6889 Other general symptoms and signs: Secondary | ICD-10-CM | POA: Diagnosis not present

## 2020-08-06 DIAGNOSIS — I700 Atherosclerosis of aorta: Secondary | ICD-10-CM | POA: Diagnosis not present

## 2020-08-06 DIAGNOSIS — Z743 Need for continuous supervision: Secondary | ICD-10-CM | POA: Diagnosis not present

## 2020-08-06 DIAGNOSIS — R000 Tachycardia, unspecified: Secondary | ICD-10-CM | POA: Diagnosis not present

## 2020-08-06 DIAGNOSIS — R5383 Other fatigue: Secondary | ICD-10-CM | POA: Insufficient documentation

## 2020-08-06 DIAGNOSIS — R109 Unspecified abdominal pain: Secondary | ICD-10-CM | POA: Diagnosis not present

## 2020-08-06 DIAGNOSIS — M549 Dorsalgia, unspecified: Secondary | ICD-10-CM | POA: Diagnosis not present

## 2020-08-06 DIAGNOSIS — J449 Chronic obstructive pulmonary disease, unspecified: Secondary | ICD-10-CM | POA: Diagnosis not present

## 2020-08-06 DIAGNOSIS — I2510 Atherosclerotic heart disease of native coronary artery without angina pectoris: Secondary | ICD-10-CM | POA: Insufficient documentation

## 2020-08-06 DIAGNOSIS — Z7901 Long term (current) use of anticoagulants: Secondary | ICD-10-CM | POA: Diagnosis not present

## 2020-08-06 DIAGNOSIS — R112 Nausea with vomiting, unspecified: Secondary | ICD-10-CM | POA: Diagnosis not present

## 2020-08-06 DIAGNOSIS — R52 Pain, unspecified: Secondary | ICD-10-CM | POA: Diagnosis not present

## 2020-08-06 LAB — COMPREHENSIVE METABOLIC PANEL
ALT: 11 U/L (ref 0–44)
AST: 22 U/L (ref 15–41)
Albumin: 4 g/dL (ref 3.5–5.0)
Alkaline Phosphatase: 50 U/L (ref 38–126)
Anion gap: 14 (ref 5–15)
BUN: 13 mg/dL (ref 8–23)
CO2: 22 mmol/L (ref 22–32)
Calcium: 10 mg/dL (ref 8.9–10.3)
Chloride: 105 mmol/L (ref 98–111)
Creatinine, Ser: 1.42 mg/dL — ABNORMAL HIGH (ref 0.44–1.00)
GFR, Estimated: 39 mL/min — ABNORMAL LOW (ref >60)
Glucose, Bld: 165 mg/dL — ABNORMAL HIGH (ref 70–99)
Potassium: 3.9 mmol/L (ref 3.5–5.1)
Sodium: 141 mmol/L (ref 135–145)
Total Bilirubin: 0.6 mg/dL (ref 0.3–1.2)
Total Protein: 7.6 g/dL (ref 6.5–8.1)

## 2020-08-06 LAB — CBC
HCT: 40.6 % (ref 36.0–46.0)
Hemoglobin: 13.9 g/dL (ref 12.0–15.0)
MCH: 32.3 pg (ref 26.0–34.0)
MCHC: 34.2 g/dL (ref 30.0–36.0)
MCV: 94.2 fL (ref 80.0–100.0)
Platelets: 309 10*3/uL (ref 150–400)
RBC: 4.31 MIL/uL (ref 3.87–5.11)
RDW: 13.2 % (ref 11.5–15.5)
WBC: 10.4 10*3/uL (ref 4.0–10.5)
nRBC: 0 % (ref 0.0–0.2)

## 2020-08-06 LAB — RESP PANEL BY RT-PCR (FLU A&B, COVID) ARPGX2
Influenza A by PCR: NEGATIVE
Influenza B by PCR: NEGATIVE
SARS Coronavirus 2 by RT PCR: NEGATIVE

## 2020-08-06 MED ORDER — ONDANSETRON 4 MG PO TBDP
4.0000 mg | ORAL_TABLET | Freq: Once | ORAL | Status: DC
  Filled 2020-08-06: qty 1

## 2020-08-06 MED ORDER — ACETAMINOPHEN 325 MG PO TABS
650.0000 mg | ORAL_TABLET | Freq: Once | ORAL | Status: AC
  Administered 2020-08-07: 650 mg via ORAL
  Filled 2020-08-06: qty 2

## 2020-08-06 MED ORDER — ACETAMINOPHEN 325 MG PO TABS
650.0000 mg | ORAL_TABLET | Freq: Once | ORAL | Status: AC | PRN
  Administered 2020-08-06: 650 mg via ORAL
  Filled 2020-08-06: qty 2

## 2020-08-06 NOTE — ED Triage Notes (Signed)
Pt here via [facility] from home.  Man who lives with her and says he is her son (though he is older), states she has had decreased appetite x 2 days, hot and cold episodes, back pain and headache, off and on.  Initial bp 212/90 that decreased to 128/84,  spo2 95% Hr 90 cbg 186 Temp 98.8 resp 14

## 2020-08-07 ENCOUNTER — Other Ambulatory Visit: Payer: Self-pay

## 2020-08-07 ENCOUNTER — Emergency Department (HOSPITAL_COMMUNITY): Payer: Medicare Other

## 2020-08-07 DIAGNOSIS — I700 Atherosclerosis of aorta: Secondary | ICD-10-CM | POA: Diagnosis not present

## 2020-08-07 DIAGNOSIS — R109 Unspecified abdominal pain: Secondary | ICD-10-CM | POA: Diagnosis not present

## 2020-08-07 DIAGNOSIS — Z85118 Personal history of other malignant neoplasm of bronchus and lung: Secondary | ICD-10-CM | POA: Diagnosis not present

## 2020-08-07 DIAGNOSIS — R059 Cough, unspecified: Secondary | ICD-10-CM | POA: Diagnosis not present

## 2020-08-07 DIAGNOSIS — K838 Other specified diseases of biliary tract: Secondary | ICD-10-CM | POA: Diagnosis not present

## 2020-08-07 LAB — URINALYSIS, MICROSCOPIC (REFLEX): WBC, UA: NONE SEEN WBC/hpf (ref 0–5)

## 2020-08-07 LAB — URINALYSIS, ROUTINE W REFLEX MICROSCOPIC
Bilirubin Urine: NEGATIVE
Glucose, UA: NEGATIVE mg/dL
Ketones, ur: NEGATIVE mg/dL
Leukocytes,Ua: NEGATIVE
Nitrite: NEGATIVE
Protein, ur: 100 mg/dL — AB
Specific Gravity, Urine: >1.03 — ABNORMAL HIGH (ref 1.005–1.030)
pH: 5 (ref 5.0–8.0)

## 2020-08-07 LAB — LACTIC ACID, PLASMA: Lactic Acid, Venous: 1.2 mmol/L (ref 0.5–1.9)

## 2020-08-07 MED ORDER — ONDANSETRON 4 MG PO TBDP: ORAL_TABLET | ORAL | 0 refills | Status: AC

## 2020-08-07 MED ORDER — ONDANSETRON HCL 4 MG/2ML IJ SOLN
4.0000 mg | Freq: Once | INTRAMUSCULAR | Status: AC
  Administered 2020-08-07: 4 mg via INTRAVENOUS
  Filled 2020-08-07: qty 2

## 2020-08-07 MED ORDER — SODIUM CHLORIDE 0.9 % IV BOLUS
500.0000 mL | Freq: Once | INTRAVENOUS | Status: AC
  Administered 2020-08-07: 500 mL via INTRAVENOUS

## 2020-08-07 MED ORDER — MORPHINE SULFATE (PF) 4 MG/ML IV SOLN
4.0000 mg | Freq: Once | INTRAVENOUS | Status: AC
  Administered 2020-08-07: 4 mg via INTRAVENOUS
  Filled 2020-08-07: qty 1

## 2020-08-07 NOTE — ED Notes (Signed)
Patient transported to CT 

## 2020-08-07 NOTE — Discharge Instructions (Signed)
Make sure that you are drinking fluids to stay hydrated.  Your sugar was a little bit higher needs to be rechecked by your primary care doctor.  Return here as needed if you have any worsening symptoms.

## 2020-08-07 NOTE — ED Provider Notes (Addendum)
[facility] EMERGENCY DEPARTMENT Provider Note   CSN: [id number] Arrival date & time: 08/06/20  1557     History Chief Complaint  Patient presents with  . Fatigue    Ashley Miles is a 75 y.o. female.  Patient is a 75 year old female with a history of acute kidney disease, CHF, COPD, hypertension, hyperlipidemia, chronic back pain on opioids and chronic constipation who presents with fatigue and chills.  She says that she has been having some chills for the last 2 to 3 days.  Yesterday she started having some nausea and a little bit of stomach discomfort.  She has no vomiting.  No diarrhea.  No urinary symptoms.  No cough or cold symptoms.  No runny nose or congestion.  She says that she has a little bit of abdominal pain but it is more nausea.  She reports some pain across her low back which she says is chronic and unchanged.  She request some medication for this because she did not take her home opioids.        Past Medical History:  Diagnosis Date  . Acute blood loss anemia   . Acute kidney failure ([place])   . Anxiety   . Arthritis   . CHF (congestive heart failure) ([place])   . COPD (chronic obstructive pulmonary disease) ([place])   . Drug-induced constipation   . Gastric ulcer   . GERD (gastroesophageal reflux disease)   . Hiatal hernia   . Hyperlipidemia   . Hypertension   . Osteopenia   . Small cell lung cancer ([place])   . T12 compression fracture ([place])   . Tobacco use     Patient Active Problem List   Diagnosis Date Noted  . Anemia, unspecified 07/11/2020    Class: Chronic  . Ischemic cardiomyopathy 05/21/2018  . Smoking 11/20/2017  . Dyslipidemia 04/03/2017  . Coronary artery disease 03/03/2017  . Chronic systolic heart failure ([place]) 03/03/2017  . Lung cancer ([place]) 03/03/2017  . Acute systolic heart failure ([place])   . NSTEMI (non-ST elevated myocardial infarction) ([place]) 01/09/2017  . Cardiogenic shock [facility])     Past Surgical History:   Procedure Laterality Date  . CESAREAN SECTION    . ESOPHAGOGASTRODUODENOSCOPY  02/17/2017   Healing of the gastic ulcer- no antral scar. Small hiatal hernia  . LEFT HEART CATH AND CORONARY ANGIOGRAPHY N/A 01/09/2017   Procedure: Left Heart Cath and Coronary Angiography;  Surgeon: Burnell Blanks, MD;  Location: [facility] CV [facility];  Service: Cardiovascular;  Laterality: N/A;  . LUNG CANCER SURGERY  2014     OB History   No obstetric history on file.     Family History  Problem Relation Age of Onset  . Hypertension Father        died pnuemonia  . Cancer Father   . Blindness Mother        died falling out of wheelchair  . Colon cancer Neg Hx   . Esophageal cancer Neg Hx     Social History   Tobacco Use  . Smoking status: Current Every Day Smoker    Types: Cigarettes  . Smokeless tobacco: Never Used  Vaping Use  . Vaping Use: Former  Substance Use Topics  . Alcohol use: No  . Drug use: No    Home Medications Prior to Admission medications   Medication Sig Start Date End Date Taking? Authorizing Provider  ondansetron (ZOFRAN ODT) 4 MG disintegrating tablet 4mg  ODT q4 hours prn nausea/vomit 08/07/20  Yes Malvin Johns, MD  buPROPion (WELLBUTRIN SR) 150 MG 12 hr tablet Take 150 mg by mouth 2 (two) times daily.    [provider]  busPIRone (BUSPAR) 15 MG tablet TAKE 1/2 TABLET BY MOUTH TWICE DAILY FOR 1 WEEK, IF NEEDED INCREASE TO 1 TABLET TWICE DAILY FOR WEEK 2, IF NEEDED INCREASE TO 2 TABS TWICE D 11/15/19   [provider]  clopidogrel (PLAVIX) 75 MG tablet Take 1 tablet (75 mg total) by mouth daily. 03/03/17   Park Liter, MD  dicyclomine (BENTYL) 10 MG capsule Take 10 mg by mouth every 6 (six) hours. 12/06/19   [provider]  doxepin (SINEQUAN) 10 MG capsule TAKE 1 CAPSULE BY MOUTH AT BEDTIME AS NEEDED FOR SLEEP. 12/13/19   [provider]  doxylamine, Sleep, (UNISOM) 25 MG tablet Take 25 mg by mouth at bedtime as needed.     [provider]  ezetimibe (ZETIA) 10 MG tablet Take 1 tablet (10 mg total) by mouth daily. 04/04/17 05/03/20  Park Liter, MD  Ferrous Sulfate (FEROSUL PO) Take 325 mg by mouth daily.    [provider]  lubiprostone (AMITIZA) 24 MCG capsule Take 24 mcg by mouth 2 (two) times daily with a meal.    [provider]  metoprolol succinate (TOPROL-XL) 25 MG 24 hr tablet TAKE 1 TABLET BY MOUTH ONCE DAILY. 06/29/19   Park Liter, MD  morphine (MS CONTIN) 15 MG 12 hr tablet Take 1 tablet (15 mg total) by mouth every 12 (twelve) hours. 07/19/20   Mosher, Vida Roller A, PA-C  MOVANTIK 25 MG TABS tablet Take 1 tablet (25 mg total) by mouth daily. 12/28/19   Jackquline Denmark, MD  nitroGLYCERIN (NITROSTAT) 0.4 MG SL tablet Place 1 tablet (0.4 mg total) under the tongue every 5 (five) minutes x 3 doses as needed for chest pain. 09/14/18   Park Liter, MD  oxyCODONE-acetaminophen (PERCOCET) 10-325 MG tablet Take 1 tablet by mouth every 6 (six) hours as needed for pain. 07/19/20   Mosher, Vida Roller A, PA-C  pantoprazole (PROTONIX) 40 MG tablet Take 40 mg by mouth 2 (two) times daily. 11/29/19   [provider]  polyethylene glycol (MIRALAX / GLYCOLAX) packet Take 17 g by mouth daily.    [provider]  potassium chloride (K-DUR) 10 MEQ tablet Take 10 mEq by mouth daily.    [provider]  prochlorperazine (COMPAZINE) 10 MG tablet Take 10 mg by mouth 3 (three) times daily as needed for nausea/vomiting. 12/31/16   [provider]  rosuvastatin (CRESTOR) 10 MG tablet Take 1 tablet by mouth daily. 11/07/17   [provider]  tiZANidine (ZANAFLEX) 4 MG tablet Take 4 mg by mouth at bedtime as needed. 06/14/20   [provider]  Vitamin D, Ergocalciferol, (DRISDOL) 50000 units CAPS capsule Take 50,000 Units by mouth every 7 (seven) days.    [provider]    Allergies    Patient has no known allergies.  Review of Systems    Review of Systems  Constitutional: Positive for chills and fatigue. Negative for diaphoresis and fever.  HENT: Negative for congestion, rhinorrhea and sneezing.   Eyes: Negative.   Respiratory: Negative for cough, chest tightness and shortness of breath.   Cardiovascular: Negative for chest pain and leg swelling.  Gastrointestinal: Positive for abdominal pain and nausea. Negative for blood in stool, diarrhea and vomiting.  Genitourinary: Negative for difficulty urinating, flank pain, frequency and hematuria.  Musculoskeletal: Positive for  back pain (Chronic and unchanged from baseline). Negative for arthralgias.  Skin: Negative for rash.  Neurological: Negative for dizziness, speech difficulty, weakness, numbness and headaches.    Physical Exam Updated Vital Signs BP (!) 144/77   Pulse 80   Temp 98.7 F (37.1 C) (Oral)   Resp (!) 22   SpO2 97%   Physical Exam Constitutional:      Appearance: She is well-developed and well-nourished.  HENT:     Head: Normocephalic and atraumatic.  Eyes:     Pupils: Pupils are equal, round, and reactive to light.  Cardiovascular:     Rate and Rhythm: Regular rhythm. Tachycardia present.     Heart sounds: Normal heart sounds.     Comments: Mild tachycardia Pulmonary:     Effort: Pulmonary effort is normal. No respiratory distress.     Breath sounds: Normal breath sounds. No wheezing or rales.  Chest:     Chest wall: No tenderness.  Abdominal:     General: Bowel sounds are normal.     Palpations: Abdomen is soft.     Tenderness: There is no abdominal tenderness. There is no guarding or rebound.  Musculoskeletal:        General: No edema. Normal range of motion.     Cervical back: Normal range of motion and neck supple.  Lymphadenopathy:     Cervical: No cervical adenopathy.  Skin:    General: Skin is warm and dry.     Findings: No rash.  Neurological:     Mental Status: She is alert and oriented to person, place, and time.   Psychiatric:        Mood and Affect: Mood and affect normal.     ED Results / Procedures / Treatments   Labs (all labs ordered are listed, but only abnormal results are displayed) Labs Reviewed  COMPREHENSIVE METABOLIC PANEL - Abnormal; Notable for the following components:      Result Value   Glucose, Bld 165 (*)    Creatinine, Ser 1.42 (*)    GFR, Estimated 39 (*)    All other components within normal limits  URINALYSIS, ROUTINE W REFLEX MICROSCOPIC - Abnormal; Notable for the following components:   APPearance HAZY (*)    Specific Gravity, Urine >1.030 (*)    Hgb urine dipstick SMALL (*)    Protein, ur 100 (*)    All other components within normal limits  URINALYSIS, MICROSCOPIC (REFLEX) - Abnormal; Notable for the following components:   Bacteria, UA RARE (*)    All other components within normal limits  RESP PANEL BY RT-PCR (FLU A&B, COVID) ARPGX2  CBC  LACTIC ACID, PLASMA  POC SARS CORONAVIRUS 2 AG -  ED    EKG EKG Interpretation  Date/Time:  Monday August 07 2020 09:46:46 EST Ventricular Rate:  82 PR Interval:    QRS Duration: 106 QT Interval:  391 QTC Calculation: 457 R Axis:   77 Text Interpretation: Sinus rhythm Atrial premature complexes Anteroseptal infarct, age indeterminate Confirmed by Malvin Johns [phone]) on 08/07/2020 10:14:16 AM   Radiology CT Abdomen Pelvis Wo Contrast  Result Date: 08/07/2020 CLINICAL DATA:  History of lung cancer, acute abdominal pain EXAM: CT ABDOMEN AND PELVIS WITHOUT CONTRAST TECHNIQUE: Multidetector CT imaging of the abdomen and pelvis was performed following the standard protocol without IV contrast. COMPARISON:  12/04/2019 CT AP with contrast FINDINGS: Lower chest: No acute abnormality. Hepatobiliary: Limited without IV contrast. No large focal hepatic abnormality or intrahepatic biliary dilatation. Gallbladder nondistended. Biliary  system unremarkable. Common bile duct nondilated. Pancreas: Unremarkable. No pancreatic ductal  dilatation or surrounding inflammatory changes. Spleen: Normal in size without focal abnormality. Adrenals/Urinary Tract: Adrenal glands are unremarkable. Kidneys are normal, without renal calculi, focal lesion, or hydronephrosis. Bladder is unremarkable. Stomach/Bowel: Limited without oral contrast. Negative for bowel obstruction, significant dilatation, ileus, or free air. No free fluid, fluid collection, hemorrhage, hematoma, abscess or ascites. Vascular/Lymphatic: Aortoiliac atherosclerosis without aneurysm. No retroperitoneal hemorrhage or hematoma. No bulky adenopathy. Reproductive: Tubal ligation clips noted. No adnexal abnormality. Uterus normal in size for age. No pelvic free fluid. Other: No abdominal wall hernia or abnormality. No abdominopelvic ascites. Musculoskeletal: Degenerative changes of the spine. Bones are osteopenic. No acute osseous finding. IMPRESSION: No acute intra-abdominal or pelvic finding by noncontrast CT. Aortic Atherosclerosis (ICD10-I70.0). Electronically Signed   By: Jerilynn Mages.  Shick M.D.   On: 08/07/2020 12:28   DG Chest 2 View  Result Date: 08/07/2020 CLINICAL DATA:  Cough EXAM: CHEST - 2 VIEW COMPARISON:  None. FINDINGS: The heart size and mediastinal contours are within normal limits. A right-sided MediPort catheter seen with the tip at the superior cavoatrial junction. Left apical pleural scarring with post treatment changes is again noted. The visualized skeletal structures are unremarkable. IMPRESSION: No active cardiopulmonary disease. Electronically Signed   By: Prudencio Pair M.D.   On: 08/07/2020 01:11    Procedures Procedures (including critical care time)  Medications Ordered in ED Medications  ondansetron (ZOFRAN-ODT) disintegrating tablet 4 mg (has no administration in time range)  acetaminophen (TYLENOL) tablet 650 mg (650 mg Oral Given 08/06/20 1617)  acetaminophen (TYLENOL) tablet 650 mg (650 mg Oral Given 08/07/20 0026)  sodium chloride 0.9 % bolus 500 mL (0 mLs  Intravenous Stopped 08/07/20 1229)  ondansetron (ZOFRAN) injection 4 mg (4 mg Intravenous Given 08/07/20 0944)  morphine 4 MG/ML injection 4 mg (4 mg Intravenous Given 08/07/20 0944)    ED Course  I have reviewed the triage vital signs and the nursing notes.  Pertinent labs & imaging results that were available during my care of the patient were reviewed by me and considered in my medical decision making (see chart for details).    MDM Rules/Calculators/A&P                          Patient is a 75 year old female who presents with chills and fatigue with some nausea.  She had some vague abdominal pain.  Her labs are nonconcerning.  Her glucose is a little bit elevated.  Her creatinine is elevated but similar to prior values.  Her lactate is normal and she does not have any other suggestions of sepsis.  Her urine does not show significant signs of infection.  Her chest x-ray is clear without evidence of pneumonia.  Her Covid test was negative.  Given her mild abdominal pain, CT scan was performed shows no acute abnormality.  She was given IV fluids and Zofran.  She feels much better after this.  She was given morphine for her chronic back pain and this seems to be unchanged from her baseline.  She does not have any neurologic deficits.  She does not have any fever or change in her back pain that would be more concerning for other etiologies such as epidural abscess.  She is feeling much better after treatment in the ED.  She is able to eat and drink without nausea.  She was discharged home in good condition.  This could be a  viral illness.  She was given symptomatic care instructions and return precautions.  She was encouraged to follow-up with her PCP. Final Clinical Impression(s) / ED Diagnoses Final diagnoses:  Fatigue, unspecified type  Nausea    Rx / DC Orders ED Discharge Orders         Ordered    ondansetron (ZOFRAN ODT) 4 MG disintegrating tablet        08/07/20 1251           Malvin Johns, MD 08/07/20 1256    Malvin Johns, MD 08/07/20 1258

## 2020-08-14 ENCOUNTER — Other Ambulatory Visit: Payer: Self-pay

## 2020-08-14 DIAGNOSIS — Z8739 Personal history of other diseases of the musculoskeletal system and connective tissue: Secondary | ICD-10-CM

## 2020-08-14 MED ORDER — MORPHINE SULFATE ER 15 MG PO TBCR: 15.0000 mg | EXTENDED_RELEASE_TABLET | Freq: Two times a day (BID) | ORAL | 0 refills | Status: DC

## 2020-08-14 MED ORDER — OXYCODONE-ACETAMINOPHEN 10-325 MG PO TABS: 1.0000 | ORAL_TABLET | Freq: Four times a day (QID) | ORAL | 0 refills | Status: DC | PRN

## 2020-08-15 DIAGNOSIS — E538 Deficiency of other specified B group vitamins: Secondary | ICD-10-CM | POA: Diagnosis not present

## 2020-08-17 ENCOUNTER — Encounter: Payer: Self-pay | Admitting: Oncology

## 2020-09-13 ENCOUNTER — Other Ambulatory Visit: Payer: Self-pay | Admitting: Hematology and Oncology

## 2020-09-13 DIAGNOSIS — Z8739 Personal history of other diseases of the musculoskeletal system and connective tissue: Secondary | ICD-10-CM

## 2020-10-09 ENCOUNTER — Other Ambulatory Visit: Payer: Self-pay | Admitting: Hematology and Oncology

## 2020-10-09 DIAGNOSIS — Z8739 Personal history of other diseases of the musculoskeletal system and connective tissue: Secondary | ICD-10-CM

## 2020-10-24 DIAGNOSIS — K259 Gastric ulcer, unspecified as acute or chronic, without hemorrhage or perforation: Secondary | ICD-10-CM | POA: Insufficient documentation

## 2020-10-24 DIAGNOSIS — K449 Diaphragmatic hernia without obstruction or gangrene: Secondary | ICD-10-CM | POA: Insufficient documentation

## 2020-10-24 DIAGNOSIS — K5903 Drug induced constipation: Secondary | ICD-10-CM | POA: Insufficient documentation

## 2020-10-24 DIAGNOSIS — N179 Acute kidney failure, unspecified: Secondary | ICD-10-CM | POA: Insufficient documentation

## 2020-10-24 DIAGNOSIS — F419 Anxiety disorder, unspecified: Secondary | ICD-10-CM | POA: Insufficient documentation

## 2020-10-24 DIAGNOSIS — E785 Hyperlipidemia, unspecified: Secondary | ICD-10-CM | POA: Insufficient documentation

## 2020-10-24 DIAGNOSIS — I509 Heart failure, unspecified: Secondary | ICD-10-CM | POA: Insufficient documentation

## 2020-10-24 DIAGNOSIS — Z720 Tobacco use: Secondary | ICD-10-CM | POA: Insufficient documentation

## 2020-10-24 DIAGNOSIS — J449 Chronic obstructive pulmonary disease, unspecified: Secondary | ICD-10-CM | POA: Insufficient documentation

## 2020-10-24 DIAGNOSIS — S22080A Wedge compression fracture of T11-T12 vertebra, initial encounter for closed fracture: Secondary | ICD-10-CM | POA: Insufficient documentation

## 2020-10-24 DIAGNOSIS — C3490 Malignant neoplasm of unspecified part of unspecified bronchus or lung: Secondary | ICD-10-CM | POA: Insufficient documentation

## 2020-10-24 DIAGNOSIS — I10 Essential (primary) hypertension: Secondary | ICD-10-CM | POA: Insufficient documentation

## 2020-10-24 DIAGNOSIS — M1990 Unspecified osteoarthritis, unspecified site: Secondary | ICD-10-CM | POA: Insufficient documentation

## 2020-10-24 DIAGNOSIS — M8580 Other specified disorders of bone density and structure, unspecified site: Secondary | ICD-10-CM | POA: Insufficient documentation

## 2020-10-24 DIAGNOSIS — K219 Gastro-esophageal reflux disease without esophagitis: Secondary | ICD-10-CM | POA: Insufficient documentation

## 2020-10-24 DIAGNOSIS — D62 Acute posthemorrhagic anemia: Secondary | ICD-10-CM | POA: Insufficient documentation

## 2020-10-25 ENCOUNTER — Ambulatory Visit (INDEPENDENT_AMBULATORY_CARE_PROVIDER_SITE_OTHER): Payer: Medicare Other | Admitting: Cardiology

## 2020-10-25 ENCOUNTER — Encounter: Payer: Self-pay | Admitting: Cardiology

## 2020-10-25 ENCOUNTER — Other Ambulatory Visit: Payer: Self-pay

## 2020-10-25 VITALS — BP 142/82 | HR 94 | Ht 66.0 in | Wt 169.0 lb

## 2020-10-25 DIAGNOSIS — I5042 Chronic combined systolic (congestive) and diastolic (congestive) heart failure: Secondary | ICD-10-CM | POA: Diagnosis not present

## 2020-10-25 DIAGNOSIS — G459 Transient cerebral ischemic attack, unspecified: Secondary | ICD-10-CM

## 2020-10-25 DIAGNOSIS — I10 Essential (primary) hypertension: Secondary | ICD-10-CM | POA: Diagnosis not present

## 2020-10-25 DIAGNOSIS — C3490 Malignant neoplasm of unspecified part of unspecified bronchus or lung: Secondary | ICD-10-CM | POA: Diagnosis not present

## 2020-10-25 DIAGNOSIS — R0602 Shortness of breath: Secondary | ICD-10-CM

## 2020-10-25 NOTE — Patient Instructions (Signed)
Medication Instructions:  Your physician recommends that you continue on your current medications as directed. Please refer to the Current Medication list given to you today.  *If you need a refill on your cardiac medications before your next appointment, please call your pharmacy*   Lab Work: None If you have labs (blood work) drawn today and your tests are completely normal, you will receive your results only by: Marland Kitchen MyChart Message (if you have MyChart) OR . A paper copy in the mail If you have any lab test that is abnormal or we need to change your treatment, we will call you to review the results.   Testing/Procedures: Your physician has requested that you have an echocardiogram. Echocardiography is a painless test that uses sound waves to create images of your heart. It provides your doctor with information about the size and shape of your heart and how well your heart's chambers and valves are working. This procedure takes approximately one hour. There are no restrictions for this procedure.  Your physician has requested that you have a carotid duplex. This test is an ultrasound of the carotid arteries in your neck. It looks at blood flow through these arteries that supply the brain with blood. Allow one hour for this exam. There are no restrictions or special instructions.  Non-Cardiac CT scanning, (CAT scanning), is a noninvasive, special x-ray that produces cross-sectional images of the body using x-rays and a computer. CT scans help physicians diagnose and treat medical conditions. For some CT exams, a contrast material is used to enhance visibility in the area of the body being studied. CT scans provide greater clarity and reveal more details than regular x-ray exams.     Follow-Up: At [facility], you and your health needs are our priority.  As part of our continuing mission to provide you with exceptional heart care, we have created designated Provider Care Teams.  These Care  Teams include your primary Cardiologist (physician) and Advanced Practice Providers (APPs -  Physician Assistants and Nurse Practitioners) who all work together to provide you with the care you need, when you need it.  We recommend signing up for the patient portal called "MyChart".  Sign up information is provided on this After Visit Summary.  MyChart is used to connect with patients for Virtual Visits (Telemedicine).  Patients are able to view lab/test results, encounter notes, upcoming appointments, etc.  Non-urgent messages can be sent to your provider as well.   To learn more about what you can do with MyChart, go to [email or web address].    Your next appointment:   6 month(s)  The format for your next appointment:   In Person  Provider:   Jenne Campus, MD   Other Instructions   Echocardiogram An echocardiogram is a test that uses sound waves (ultrasound) to produce images of the heart. Images from an echocardiogram can provide important information about:  Heart size and shape.  The size and thickness and movement of your heart's walls.  Heart muscle function and strength.  Heart valve function or if you have stenosis. Stenosis is when the heart valves are too narrow.  If blood is flowing backward through the heart valves (regurgitation).  A tumor or infectious growth around the heart valves.  Areas of heart muscle that are not working well because of poor blood flow or injury from a heart attack.  Aneurysm detection. An aneurysm is a weak or damaged part of an artery wall. The wall bulges out from the normal  force of blood pumping through the body. Tell a health care provider about:  Any allergies you have.  All medicines you are taking, including vitamins, herbs, eye drops, creams, and over-the-counter medicines.  Any blood disorders you have.  Any surgeries you have had.  Any medical conditions you have.  Whether you are pregnant or may be  pregnant. What are the risks? Generally, this is a safe test. However, problems may occur, including an allergic reaction to dye (contrast) that may be used during the test. What happens before the test? No specific preparation is needed. You may eat and drink normally. What happens during the test?  You will take off your clothes from the waist up and put on a hospital gown.  Electrodes or electrocardiogram (ECG)patches may be placed on your chest. The electrodes or patches are then connected to a device that monitors your heart rate and rhythm.  You will lie down on a table for an ultrasound exam. A gel will be applied to your chest to help sound waves pass through your skin.  A handheld device, called a transducer, will be pressed against your chest and moved over your heart. The transducer produces sound waves that travel to your heart and bounce back (or "echo" back) to the transducer. These sound waves will be captured in real-time and changed into images of your heart that can be viewed on a video monitor. The images will be recorded on a computer and reviewed by your health care provider.  You may be asked to change positions or hold your breath for a short time. This makes it easier to get different views or better views of your heart.  In some cases, you may receive contrast through an IV in one of your veins. This can improve the quality of the pictures from your heart. The procedure may vary among health care providers and hospitals.   What can I expect after the test? You may return to your normal, everyday life, including diet, activities, and medicines, unless your health care provider tells you not to do that. Follow these instructions at home:  It is up to you to get the results of your test. Ask your health care provider, or the department that is doing the test, when your results will be ready.  Keep all follow-up visits. This is important. Summary  An echocardiogram is  a test that uses sound waves (ultrasound) to produce images of the heart.  Images from an echocardiogram can provide important information about the size and shape of your heart, heart muscle function, heart valve function, and other possible heart problems.  You do not need to do anything to prepare before this test. You may eat and drink normally.  After the echocardiogram is completed, you may return to your normal, everyday life, unless your health care provider tells you not to do that. This information is not intended to replace advice given to you by your health care provider. Make sure you discuss any questions you have with your health care provider. Document Revised: 03/14/2020 Document Reviewed: 03/14/2020 Elsevier Patient Education  2021 [employer].

## 2020-10-25 NOTE — Progress Notes (Signed)
Cardiology Office Note:    Date:  10/25/2020   ID:  Ashley Miles, DOB 09/12/1945, MRN [id number]  PCP:  Penelope Coop, FNP  Cardiologist:  Jenne Campus, MD    Referring MD: Penelope Coop, FNP   Chief Complaint  Patient presents with  . possible minor stroke    History of Present Illness:    Ashley Miles is a 75 y.o. 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  There is also some noncompliance. She is coming today 2 months for follow-up she described more shortness of breath and usually she understand that probably the reason for that is lung cancer as well as smoking.  However she is at its worse than it was before on top of that she described 2 episodes when she had problems speaking with some face deformities that was noted by her daughter that she was with at the restaurants.  Entire episode lasted for maybe few minutes after that everything came back to normal and she was able to do what she wants.  She is worried about mini strokes. Cardiac wise doing well no chest pain tightness squeezing pressure burning chest.  Past Medical History:  Diagnosis Date  . Acute blood loss anemia   . Acute kidney failure ([place])   . Acute systolic heart failure ([place])   . Anemia, unspecified 07/11/2020  . Anxiety   . Arthritis   . Cardiogenic shock ([place])   . CHF (congestive heart failure) ([place])   . Chronic systolic heart failure ([place]) 03/03/2017   Cardiac catheterization 2018 showed diagonal branch occluded and right coronary artery stenosis however right coronary artery was very small, the feeling was that degree of coronary artery disease is low to her ejection fraction.  Marland Kitchen COPD (chronic obstructive pulmonary disease) ([place])   .  Coronary artery disease 03/03/2017   Cardiac catheterization June 2018 showing 90% small RCA, completely occluded diagonal branch. Unsuccessful attempt to open up diagonal branch.  . Drug-induced constipation   . Dyslipidemia 04/03/2017  . Gastric ulcer   . GERD (gastroesophageal reflux disease)   . Hiatal hernia   . Hyperlipidemia   . Hypertension   . Ischemic cardiomyopathy 05/21/2018  . Lung cancer ([place]) 03/03/2017  . NSTEMI (non-ST elevated myocardial infarction) ([place]) 01/09/2017  . Osteopenia   . Small cell lung cancer ([place])   . Smoking 11/20/2017  . T12 compression fracture ([place])   . Tobacco use     Past Surgical History:  Procedure Laterality Date  . CESAREAN SECTION    . ESOPHAGOGASTRODUODENOSCOPY  02/17/2017   Healing of the gastic ulcer- no antral scar. Small hiatal hernia  . LEFT HEART CATH AND CORONARY ANGIOGRAPHY N/A 01/09/2017   Procedure: Left Heart Cath and Coronary Angiography;  Surgeon: Burnell Blanks, MD;  Location: [facility];  Service: Cardiovascular;  Laterality: N/A;  . LUNG CANCER SURGERY  2014    Current Medications: Current Meds  Medication Sig  . albuterol (VENTOLIN HFA) 108 (90 Base) MCG/ACT inhaler Inhale 2 puffs into the lungs as needed for shortness of breath.  Marland Kitchen buPROPion (WELLBUTRIN SR) 150 MG 12 hr tablet Take 150 mg by mouth 2 (two) times daily.  . busPIRone (BUSPAR) 15 MG tablet TAKE 1/2 TABLET BY MOUTH TWICE DAILY  FOR 1 WEEK, IF NEEDED INCREASE TO 1 TABLET TWICE DAILY FOR WEEK 2, IF NEEDED INCREASE TO 2 TABS TWICE D  . clopidogrel (PLAVIX) 75 MG tablet Take 1 tablet (75 mg total) by mouth daily.  Marland Kitchen dicyclomine (BENTYL) 10 MG capsule Take 10 mg by mouth every 6 (six) hours.  Marland Kitchen doxepin (SINEQUAN) 10 MG capsule Take 10 mg by mouth daily.  Marland Kitchen doxylamine, Sleep, (UNISOM) 25 MG tablet Take 25 mg by mouth at bedtime as needed for sleep.  Marland Kitchen ezetimibe (ZETIA) 10 MG tablet Take 1 tablet (10 mg total) by mouth daily.  . Ferrous Sulfate  (FEROSUL PO) Take 325 mg by mouth daily.  Marland Kitchen lubiprostone (AMITIZA) 24 MCG capsule Take 24 mcg by mouth 2 (two) times daily with a meal.  . metoprolol succinate (TOPROL-XL) 25 MG 24 hr tablet TAKE 1 TABLET BY MOUTH ONCE DAILY. (Patient taking differently: Take 25 mg by mouth daily.)  . morphine (MS CONTIN) 15 MG 12 hr tablet TAKE ONE TABLET BY MOUTH EVERY 12 HOURS (Patient taking differently: Take 15 mg by mouth every 12 (twelve) hours.)  . MOVANTIK 25 MG TABS tablet Take 1 tablet (25 mg total) by mouth daily.  . nitroGLYCERIN (NITROSTAT) 0.4 MG SL tablet Place 1 tablet (0.4 mg total) under the tongue every 5 (five) minutes x 3 doses as needed for chest pain.  Marland Kitchen ondansetron (ZOFRAN ODT) 4 MG disintegrating tablet 4mg  ODT q4 hours prn nausea/vomit (Patient taking differently: Take 4 mg by mouth as needed for nausea or vomiting. 4mg  ODT q4 hours prn nausea/vomit)  . oxyCODONE-acetaminophen (PERCOCET) 10-325 MG tablet TAKE ONE TABLET BY MOUTH EVERY 6 HOURS AS NEEDED FOR PAIN (Patient taking differently: Take 1 tablet by mouth as needed for pain.)  . pantoprazole (PROTONIX) 40 MG tablet Take 40 mg by mouth 2 (two) times daily.  . polyethylene glycol (MIRALAX / GLYCOLAX) packet Take 17 g by mouth daily.  . potassium chloride (K-DUR) 10 MEQ tablet Take 10 mEq by mouth daily.  . prochlorperazine (COMPAZINE) 10 MG tablet Take 10 mg by mouth 3 (three) times daily as needed for nausea/vomiting, nausea or vomiting.  . rosuvastatin (CRESTOR) 10 MG tablet Take 1 tablet by mouth daily.  Marland Kitchen tiZANidine (ZANAFLEX) 4 MG tablet Take 4 mg by mouth at bedtime as needed for muscle spasms.  . Vitamin D, Ergocalciferol, (DRISDOL) 50000 units CAPS capsule Take 50,000 Units by mouth every 7 (seven) days.     Allergies:   Patient has no known allergies.   Social History   Socioeconomic History  . Marital status: Divorced    Spouse name: Not on file  . Number of children: Not on file  . Years of education: Not on file   . Highest education level: Not on file  Occupational History  . Not on file  Tobacco Use  . Smoking status: Current Every Day Smoker    Types: Cigarettes  . Smokeless tobacco: Never Used  Vaping Use  . Vaping Use: Former  Substance and Sexual Activity  . Alcohol use: No  . Drug use: No  . Sexual activity: Not on file  Other Topics Concern  . Not on file  Social History Narrative  . Not on file   Social Determinants of Health   Financial Resource Strain: Not on file  Food Insecurity: Not on file  Transportation Needs: Not on file  Physical Activity: Not on file  Stress: Not on file  Social Connections: Not on file  Family History: The patient's family history includes Blindness in her mother; Cancer in her father; Hypertension in her father. There is no history of Colon cancer or Esophageal cancer. ROS:   Please see the history of present illness.    All 14 point review of systems negative except as described per history of present illness  EKGs/Labs/Other Studies Reviewed:      Recent Labs: 08/06/2020: ALT 11; BUN 13; Creatinine, Ser 1.42; Hemoglobin 13.9; Platelets 309; Potassium 3.9; Sodium 141  Recent Lipid Panel    Component Value Date/Time   CHOL 146 05/21/2018 1134   TRIG 194 (H) 05/21/2018 1134   HDL 47 05/21/2018 1134   CHOLHDL 3.1 05/21/2018 1134   CHOLHDL 5.0 01/11/2017 0521   VLDL 27 01/11/2017 0521   LDLCALC 60 05/21/2018 1134    Physical Exam:    VS:  BP (!) 142/82 (BP Location: Left Arm, Patient Position: Sitting)   Pulse 94   Ht 5\' 6"  (1.676 m)   Wt 169 lb (76.7 kg)   SpO2 94%   BMI 27.28 kg/m     Wt Readings from Last 3 Encounters:  10/25/20 169 lb (76.7 kg)  07/11/20 168 lb 8 oz (76.4 kg)  05/03/20 168 lb (76.2 kg)     GEN:  Well nourished, well developed in no acute distress HEENT: Normal NECK: No JVD; No carotid bruits LYMPHATICS: No lymphadenopathy CARDIAC: RRR, no murmurs, no rubs, no gallops RESPIRATORY: Poor entry  bilateral bilateral wheezes ABDOMEN: Soft, non-tender, non-distended MUSCULOSKELETAL:  No edema; No deformity  SKIN: Warm and dry LOWER EXTREMITIES: no swelling NEUROLOGIC:  Alert and oriented x 3 PSYCHIATRIC:  Normal affect   ASSESSMENT:    1. Chronic combined systolic and diastolic congestive heart failure ([place])   2. Small cell lung cancer ([place])   3. Primary hypertension   4. TIA (transient ischemic attack)    PLAN:    In order of problems listed above:  1. Chronic combined systolic and diastolic congestive heart failure, echocardiogram reviewed done in December of last year showed preserved ejection fraction.  There is also small pericardial effusion.  Since she does have worsening of shortness of breath which I think is simply progression of her pulmonary problem I still have to make sure there is not related to her heart condition.  I will schedule her to have an echocardiogram to assess left ventricle ejection fraction more importantly look at protocol effusion.  However my physical exam did not reveal any evidence of tamponade. 2. Small cell lung CA.  To be followed excellently by our oncology team. 3. Essential hypertension, blood pressure well controlled we will continue present management. 4. Dyslipidemia I did review her K PN which show me data from June 13, 2020 with LDL of 72 HDL 47.  We will continue present management. 5. Smoking obviously huge problem I strongly recommended to quit.  She said she will try but understand that she be able to accomplish that goal. 6. Possibility of TIA.  CT of the head will be done.  She will have carotic ultrasounds as well as echocardiogram.   Medication Adjustments/Labs and Tests Ordered: Current medicines are reviewed at length with the patient today.  Concerns regarding medicines are outlined above.  No orders of the defined types were placed in this encounter.  Medication changes: No orders of the defined types were placed in this  encounter.   Signed, Park Liter, MD, [facility] 10/25/2020 1:41 PM    [facility]

## 2020-10-30 ENCOUNTER — Encounter: Payer: Self-pay | Admitting: Cardiology

## 2020-11-09 ENCOUNTER — Other Ambulatory Visit: Payer: Self-pay

## 2020-11-09 DIAGNOSIS — Z8739 Personal history of other diseases of the musculoskeletal system and connective tissue: Secondary | ICD-10-CM

## 2020-11-09 MED ORDER — MORPHINE SULFATE ER 15 MG PO TBCR: 15.0000 mg | EXTENDED_RELEASE_TABLET | Freq: Two times a day (BID) | ORAL | 0 refills | Status: DC

## 2020-11-09 MED ORDER — OXYCODONE-ACETAMINOPHEN 10-325 MG PO TABS: 1.0000 | ORAL_TABLET | Freq: Four times a day (QID) | ORAL | 0 refills | Status: DC | PRN

## 2020-11-16 ENCOUNTER — Ambulatory Visit (INDEPENDENT_AMBULATORY_CARE_PROVIDER_SITE_OTHER): Payer: Medicare Other

## 2020-11-16 ENCOUNTER — Other Ambulatory Visit: Payer: Self-pay

## 2020-11-16 ENCOUNTER — Telehealth: Payer: Self-pay | Admitting: Cardiology

## 2020-11-16 DIAGNOSIS — G459 Transient cerebral ischemic attack, unspecified: Secondary | ICD-10-CM

## 2020-11-16 DIAGNOSIS — R0602 Shortness of breath: Secondary | ICD-10-CM

## 2020-11-16 LAB — ECHOCARDIOGRAM COMPLETE
Area-P 1/2: 3.51 cm2
S' Lateral: 3.5 cm

## 2020-11-16 NOTE — Progress Notes (Signed)
Complete echocardiogram performed. Contrast needed/ IV attempted without success/ Difficult stick/ Patient scared of needles.  Jimmy Rinoa Garramone RDCS, RVT

## 2020-11-16 NOTE — Telephone Encounter (Signed)
Pt would like the results of her CT from [facility].  Thank you!  Ammie Dalton

## 2020-11-16 NOTE — Progress Notes (Signed)
Carotid duplex exam has been performed.  Jimmy Rory Xiang RDCS, RVT 

## 2020-11-20 NOTE — Telephone Encounter (Signed)
Patient daughter informed of these results per dpr

## 2020-11-30 ENCOUNTER — Other Ambulatory Visit: Payer: Self-pay | Admitting: Gastroenterology

## 2020-12-07 ENCOUNTER — Other Ambulatory Visit: Payer: Self-pay

## 2020-12-07 DIAGNOSIS — Z8739 Personal history of other diseases of the musculoskeletal system and connective tissue: Secondary | ICD-10-CM

## 2020-12-07 MED ORDER — OXYCODONE-ACETAMINOPHEN 10-325 MG PO TABS: 1.0000 | ORAL_TABLET | Freq: Four times a day (QID) | ORAL | 0 refills | Status: DC | PRN

## 2020-12-07 MED ORDER — MORPHINE SULFATE ER 15 MG PO TBCR: 15.0000 mg | EXTENDED_RELEASE_TABLET | Freq: Two times a day (BID) | ORAL | 0 refills | Status: DC

## 2021-01-09 ENCOUNTER — Other Ambulatory Visit: Payer: Self-pay

## 2021-01-09 DIAGNOSIS — Z8739 Personal history of other diseases of the musculoskeletal system and connective tissue: Secondary | ICD-10-CM

## 2021-01-09 MED ORDER — MORPHINE SULFATE ER 15 MG PO TBCR: 15.0000 mg | EXTENDED_RELEASE_TABLET | Freq: Two times a day (BID) | ORAL | 0 refills | Status: DC

## 2021-01-09 MED ORDER — OXYCODONE-ACETAMINOPHEN 10-325 MG PO TABS: 1.0000 | ORAL_TABLET | Freq: Four times a day (QID) | ORAL | 0 refills | Status: DC | PRN

## 2021-01-10 ENCOUNTER — Inpatient Hospital Stay: Payer: Medicare Other

## 2021-01-10 ENCOUNTER — Inpatient Hospital Stay: Payer: Medicare Other | Admitting: Oncology

## 2021-02-06 ENCOUNTER — Other Ambulatory Visit: Payer: Self-pay

## 2021-02-06 DIAGNOSIS — Z8739 Personal history of other diseases of the musculoskeletal system and connective tissue: Secondary | ICD-10-CM

## 2021-02-06 MED ORDER — OXYCODONE-ACETAMINOPHEN 10-325 MG PO TABS: 1.0000 | ORAL_TABLET | Freq: Four times a day (QID) | ORAL | 0 refills | Status: DC | PRN

## 2021-02-06 MED ORDER — MORPHINE SULFATE ER 15 MG PO TBCR: 15.0000 mg | EXTENDED_RELEASE_TABLET | Freq: Two times a day (BID) | ORAL | 0 refills | Status: DC

## 2021-03-01 DIAGNOSIS — E538 Deficiency of other specified B group vitamins: Secondary | ICD-10-CM | POA: Diagnosis not present

## 2021-03-08 ENCOUNTER — Other Ambulatory Visit: Payer: Self-pay

## 2021-03-08 DIAGNOSIS — Z8739 Personal history of other diseases of the musculoskeletal system and connective tissue: Secondary | ICD-10-CM

## 2021-03-08 DIAGNOSIS — M546 Pain in thoracic spine: Secondary | ICD-10-CM

## 2021-03-08 DIAGNOSIS — G8929 Other chronic pain: Secondary | ICD-10-CM

## 2021-03-08 DIAGNOSIS — M479 Spondylosis, unspecified: Secondary | ICD-10-CM

## 2021-03-08 MED ORDER — MORPHINE SULFATE ER 15 MG PO TBCR: 15.0000 mg | EXTENDED_RELEASE_TABLET | Freq: Two times a day (BID) | ORAL | 0 refills | Status: DC

## 2021-03-08 MED ORDER — OXYCODONE-ACETAMINOPHEN 10-325 MG PO TABS: 1.0000 | ORAL_TABLET | Freq: Four times a day (QID) | ORAL | 0 refills | Status: DC | PRN

## 2021-03-08 NOTE — Progress Notes (Signed)
[facility]  [address] [place],  [place]  [id number] [phone]  Clinic Day:  03/15/2021  Referring physician: Penelope Coop, FNP   This document serves as a record of services personally performed by Hosie Poisson, MD. It was created on their behalf by Curry,Lauren E, a trained medical scribe. The creation of this record is based on the scribe's personal observations and the provider's statements to them.  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 She was treated with concurrent radiation and chemotherapy, which included 6 cycles of carboplatin and Etoposide, that was completed in October 2014. She has restaging scans every 3 months that have remained stable.  A PET scan in March 2015 showed only mild to moderate metabolic activity in the area of her original tumor.  She does have degenerative disc disease and osteoarthritis with chronic bone pain.  We have agreed to prescribe pain medication for her as she is unable to obtain a primary care provider.  In addition, she has significant osteopenia and was placed on Fosamax along with calcium and vitamin-D.  She continues to complain of lower back pain, and rates it as an 8 and constant..  She states the Percocet 10/325 gives her relief but just for a short period of time.  In June of 2017, she was complaining of a catch under both breasts and some increased cough with occasional chest pain.  At that time her CT scan did show progressive collapse and consolidation of the left upper lobe and so a PET scan was done to evaluate this further.  This revealed mild hypermetabolism within the posterior left upper hemithorax consistent with radiation treatment  changes and appeared relatively uniform with no areas of intense uptake suspicious for malignancy.  In December of 2017, she had a rib fracture that was found on x-ray at urgent care. She was also put on MS Contin 15mg  bid. She was seen by Dr. Jackquline Denmark and found to have a large gastric ulcer with bleeding.  She was placed on Protonix twice daily and had a follow-up upper endoscopy in July of 2018, which showed good healing of her ulcer, now consistent with an antral scar, and a small hiatal hernia. .  When I saw her in April, her hemoglobin was up to 11.3 but was back down to 9.1 on July 16th.  Biopsies revealed a reactive gastropathy and was negative for H pylori.  She is on iron supplement. She has had 2 myocardial infarctions, one in March of 2019 and another in June, and was transferred to [facility] in [place].  A chest x-ray done at that time showed just a chronic opacity at the left lung apex consistent with her treated lung cancer.  She also had an echocardiogram which revealed severe impairment of her left ventricular function with an ejection fraction of 30-35%.  She had akinesis of multiple areas of the myocardium.  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  It also revealed a small stable pericardial effusion.  ECHO from July 2021 revealed a good ejection fraction of 60-65% and small stable  pericardial effusion.  CT chest from November 2021 revealed stable dense radiation fibrosis involving the left paramediastinal lung and left hilum with no findings suspicious for recurrent tumor.  Stable emphysematous changes and pulmonary scarring and stable 3 mm subpleural right lower lobe pulmonary nodule, likely benign lymph node, was also seen.  Annual mammogram from December 2021 was negative.   INTERVAL HISTORY:  Ashley Miles is here for routine follow up and states  that she has been doing fairly well.  Her back pain is fairly well controlled with oxycodone 10/325 and she averages 3-4 daily depending on her activity.  Blood counts and chemistries are unremarkable except for a creatinine of 1.3, improved from 1.4.  Her  appetite is good, and she has gained nearly 5 pounds since her last visit.  She denies fever, chills or other signs of infection.  She denies nausea, vomiting, bowel issues, or abdominal pain.  She denies sore throat, cough, dyspnea, or chest pain.  REVIEW OF SYSTEMS:  Review of Systems  Constitutional: Negative.  Negative for appetite change, chills, fatigue, fever and unexpected weight change.  HENT:  Negative.    Eyes: Negative.   Respiratory: Negative.  Negative for chest tightness, cough, hemoptysis, shortness of breath and wheezing.   Cardiovascular: Negative.  Negative for chest pain, leg swelling and palpitations.  Gastrointestinal: Negative.  Negative for abdominal distention, abdominal pain, blood in stool, constipation, diarrhea, nausea and vomiting.  Endocrine: Negative.   Genitourinary: Negative.  Negative for difficulty urinating, dysuria, frequency and hematuria.   Musculoskeletal:  Positive for back pain (well controlled with oxycodone). Negative for arthralgias, flank pain, gait problem and myalgias.  Skin: Negative.   Neurological: Negative.  Negative for dizziness, extremity weakness, gait problem, headaches, light-headedness, numbness, seizures and speech difficulty.  Hematological: Negative.   Psychiatric/Behavioral: Negative.  Negative for depression and sleep disturbance. The patient is not nervous/anxious.     VITALS:  Blood pressure (!) 148/73, pulse 69, temperature 98.2 F (36.8 C), temperature source Oral, resp. rate 18, height 5\' 6"  (1.676 m), weight 173 lb 6.4 oz (78.7 kg), SpO2 96 %.  Wt Readings from Last 3 Encounters:  03/15/21 173 lb 6.4 oz (78.7 kg)  10/25/20 169 lb (76.7 kg)  07/11/20 168 lb 8 oz (76.4  kg)    Body mass index is 27.99 kg/m.  Performance status (ECOG): 1 - Symptomatic but completely ambulatory  PHYSICAL EXAM:  Physical Exam Constitutional:      General: She is not in acute distress.    Appearance: Normal appearance. She is normal weight.  HENT:     Head: Normocephalic and atraumatic.  Eyes:     General: No scleral icterus.    Extraocular Movements: Extraocular movements intact.     Conjunctiva/sclera: Conjunctivae normal.     Pupils: Pupils are equal, round, and reactive to light.  Cardiovascular:     Rate and Rhythm: Normal rate and regular rhythm.     Pulses: Normal pulses.     Heart sounds: Normal heart sounds. No murmur heard.   No friction rub. No gallop.  Pulmonary:     Effort: Pulmonary effort is normal. No respiratory distress.     Breath sounds: Wheezing (mild expiratory) present.  Abdominal:     General: Bowel sounds are normal. There is no distension.     Palpations: Abdomen is soft. There is no hepatomegaly, splenomegaly or mass.     Tenderness: There is no abdominal tenderness.  Musculoskeletal:        General:  Normal range of motion.     Cervical back: Normal range of motion and neck supple.     Right lower leg: No edema.     Left lower leg: No edema.  Lymphadenopathy:     Cervical: No cervical adenopathy.  Skin:    General: Skin is warm and dry.  Neurological:     General: No focal deficit present.     Mental Status: She is alert and oriented to person, place, and time. Mental status is at baseline.  Psychiatric:        Mood and Affect: Mood normal.        Behavior: Behavior normal.        Thought Content: Thought content normal.        Judgment: Judgment normal.    LABS:   CBC Latest Ref Rng & Units 08/06/2020 02/03/2017 01/13/2017  WBC 4.0 - 10.5 K/uL 10.4 8.2 8.6  Hemoglobin 12.0 - 15.0 g/dL 13.9 10.7(L) 10.0(L)  Hematocrit 36.0 - 46.0 % 40.6 34.1 32.0(L)  Platelets 150 - 400 K/uL 309 406(H) 324   CMP Latest Ref Rng & Units  08/06/2020 05/21/2018 05/28/2017  Glucose 70 - 99 mg/dL 165(H) 90 103(H)  BUN 8 - 23 mg/dL 13 14 10   Creatinine 0.44 - 1.00 mg/dL 1.42(H) 1.65(H) 1.20(H)  Sodium 135 - 145 mmol/L 141 138 139  Potassium 3.5 - 5.1 mmol/L 3.9 5.4(H) 4.9  Chloride 98 - 111 mmol/L 105 98 100  CO2 22 - 32 mmol/L 22 25 25   Calcium 8.9 - 10.3 mg/dL 10.0 9.6 9.5  Total Protein 6.5 - 8.1 g/dL 7.6 - -  Total Bilirubin 0.3 - 1.2 mg/dL 0.6 - -  Alkaline Phos 38 - 126 U/L 50 - -  AST 15 - 41 U/L 22 - -  ALT 0 - 44 U/L 11 - -     No results found for: CEA1 / No results found for: CEA1   STUDIES:    Allergies: No Known Allergies  Current Medications: Current Outpatient Medications  Medication Sig Dispense Refill   albuterol (VENTOLIN HFA) 108 (90 Base) MCG/ACT inhaler Inhale 2 puffs into the lungs as needed for shortness of breath.     buPROPion (WELLBUTRIN SR) 150 MG 12 hr tablet Take 150 mg by mouth 2 (two) times daily.     busPIRone (BUSPAR) 15 MG tablet Take 15 mg by mouth daily.     clopidogrel (PLAVIX) 75 MG tablet Take 1 tablet (75 mg total) by mouth daily. 30 tablet 6   dicyclomine (BENTYL) 10 MG capsule Take 10 mg by mouth every 6 (six) hours.     doxepin (SINEQUAN) 10 MG capsule Take 10 mg by mouth daily.     doxylamine, Sleep, (UNISOM) 25 MG tablet Take 25 mg by mouth at bedtime as needed for sleep.     ezetimibe (ZETIA) 10 MG tablet Take 1 tablet (10 mg total) by mouth daily. 30 tablet 6   Ferrous Sulfate (FEROSUL PO) Take 325 mg by mouth daily.     lubiprostone (AMITIZA) 24 MCG capsule Take 24 mcg by mouth 2 (two) times daily with a meal.     metoprolol succinate (TOPROL-XL) 25 MG 24 hr tablet TAKE 1 TABLET BY MOUTH ONCE DAILY. (Patient taking differently: Take 25 mg by mouth daily.) 30 tablet 0   morphine (MS CONTIN) 15 MG 12 hr tablet Take 1 tablet (15 mg total) by mouth every 12 (twelve) hours. 60 tablet 0   MOVANTIK 25 MG TABS  tablet TAKE 1 TABLET BY MOUTH ONCE DAILY. 30 tablet 6    nitroGLYCERIN (NITROSTAT) 0.4 MG SL tablet Place 1 tablet (0.4 mg total) under the tongue every 5 (five) minutes x 3 doses as needed for chest pain. 25 tablet 12   ondansetron (ZOFRAN ODT) 4 MG disintegrating tablet 4mg  ODT q4 hours prn nausea/vomit (Patient taking differently: Take 4 mg by mouth as needed for nausea or vomiting. 4mg  ODT q4 hours prn nausea/vomit) 8 tablet 0   oxyCODONE-acetaminophen (PERCOCET) 10-325 MG tablet Take 1 tablet by mouth every 6 (six) hours as needed. for pain 120 tablet 0   pantoprazole (PROTONIX) 40 MG tablet Take 40 mg by mouth 2 (two) times daily.     polyethylene glycol (MIRALAX / GLYCOLAX) packet Take 17 g by mouth daily.     potassium chloride (K-DUR) 10 MEQ tablet Take 10 mEq by mouth daily.     prochlorperazine (COMPAZINE) 10 MG tablet Take 10 mg by mouth 3 (three) times daily as needed for nausea/vomiting, nausea or vomiting.     rosuvastatin (CRESTOR) 10 MG tablet Take 1 tablet by mouth daily.     tiZANidine (ZANAFLEX) 4 MG tablet Take 4 mg by mouth at bedtime as needed for muscle spasms.     Vitamin D, Ergocalciferol, (DRISDOL) 50000 units CAPS capsule Take 50,000 Units by mouth every 7 (seven) days.     No current facility-administered medications for this visit.     ASSESSMENT & PLAN:   Assessment:   1.  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  This was treated with chemotherapy and radiation.  She remains without evidence of disease.    2.  Tiny stable pulmonary nodules.  3.  Chronic back pain from severe degenerative disc disease and osteoarthritis.  She continues oxycodone 10/325 with fairly good control of her pain.  4.  Stable small to moderate pericardial effusion.  ECHO from July 2021 revealed an EF between 60-65%.  Plan: We flushed her port today, and will repeat this at every visit.  I will see her back in 6 months with CBC, comprehensive metabolic profile, CEA and CT chest for examination and  port flush.  She understands and agrees with this plan of care.   I provided 15 minutes of face-to-face time during this this encounter and > 50% was spent counseling as documented under my assessment and plan.    Derwood Kaplan, MD [facility] AT [facility] [address] [place] [address] Dept: [phone] Dept Fax: [phone]   I, Rita Ohara, am acting as scribe for Derwood Kaplan, MD  I have reviewed this report as typed by the medical scribe, and it is complete and accurate.

## 2021-03-15 ENCOUNTER — Encounter: Payer: Self-pay | Admitting: Oncology

## 2021-03-15 ENCOUNTER — Other Ambulatory Visit: Payer: Self-pay | Admitting: Hematology and Oncology

## 2021-03-15 ENCOUNTER — Other Ambulatory Visit: Payer: Self-pay

## 2021-03-15 ENCOUNTER — Inpatient Hospital Stay (INDEPENDENT_AMBULATORY_CARE_PROVIDER_SITE_OTHER): Payer: Medicare Other | Admitting: Oncology

## 2021-03-15 ENCOUNTER — Inpatient Hospital Stay: Payer: Medicare Other | Attending: Oncology

## 2021-03-15 VITALS — BP 148/73 | HR 69 | Temp 98.2°F | Resp 18 | Ht 66.0 in | Wt 173.4 lb

## 2021-03-15 DIAGNOSIS — G8929 Other chronic pain: Secondary | ICD-10-CM | POA: Insufficient documentation

## 2021-03-15 DIAGNOSIS — R918 Other nonspecific abnormal finding of lung field: Secondary | ICD-10-CM | POA: Diagnosis not present

## 2021-03-15 DIAGNOSIS — M1990 Unspecified osteoarthritis, unspecified site: Secondary | ICD-10-CM | POA: Insufficient documentation

## 2021-03-15 DIAGNOSIS — I313 Pericardial effusion (noninflammatory): Secondary | ICD-10-CM | POA: Insufficient documentation

## 2021-03-15 DIAGNOSIS — C3490 Malignant neoplasm of unspecified part of unspecified bronchus or lung: Secondary | ICD-10-CM

## 2021-03-15 DIAGNOSIS — Z85118 Personal history of other malignant neoplasm of bronchus and lung: Secondary | ICD-10-CM | POA: Insufficient documentation

## 2021-03-15 DIAGNOSIS — Z452 Encounter for adjustment and management of vascular access device: Secondary | ICD-10-CM | POA: Insufficient documentation

## 2021-03-15 DIAGNOSIS — C3412 Malignant neoplasm of upper lobe, left bronchus or lung: Secondary | ICD-10-CM

## 2021-03-15 LAB — COMPREHENSIVE METABOLIC PANEL
Albumin: 4.3 (ref 3.5–5.0)
Calcium: 9.3 (ref 8.7–10.7)

## 2021-03-15 LAB — HEPATIC FUNCTION PANEL
ALT: 11 (ref 7–35)
AST: 23 (ref 13–35)
Alkaline Phosphatase: 57 (ref 25–125)
Bilirubin, Total: 0.2

## 2021-03-15 LAB — BASIC METABOLIC PANEL
BUN: 13 (ref 4–21)
CO2: 18 (ref 13–22)
Chloride: 109 — AB (ref 99–108)
Creatinine: 1.3 — AB (ref 0.5–1.1)
Glucose: 117
Potassium: 4.3 (ref 3.4–5.3)
Sodium: 138 (ref 137–147)

## 2021-03-15 LAB — CBC AND DIFFERENTIAL
HCT: 39 (ref 36–46)
Hemoglobin: 12.8 (ref 12.0–16.0)
Neutrophils Absolute: 5.48
Platelets: 310 (ref 150–399)
WBC: 8.7

## 2021-03-15 LAB — CBC: RBC: 4.03 (ref 3.87–5.11)

## 2021-03-15 MED ORDER — SODIUM CHLORIDE 0.9% FLUSH
10.0000 mL | INTRAVENOUS | Status: DC | PRN
  Administered 2021-03-15: 10 mL
  Filled 2021-03-15: qty 10

## 2021-03-15 MED ORDER — HEPARIN SOD (PORK) LOCK FLUSH 100 UNIT/ML IV SOLN
500.0000 [IU] | Freq: Once | INTRAVENOUS | Status: AC | PRN
  Administered 2021-03-15: 500 [IU]
  Filled 2021-03-15: qty 5

## 2021-03-16 LAB — CEA: CEA: 2.5 ng/mL (ref 0.0–4.7)

## 2021-03-19 ENCOUNTER — Encounter: Payer: Self-pay | Admitting: Hematology and Oncology

## 2021-04-04 DIAGNOSIS — E538 Deficiency of other specified B group vitamins: Secondary | ICD-10-CM | POA: Diagnosis not present

## 2021-04-05 ENCOUNTER — Other Ambulatory Visit: Payer: Self-pay | Admitting: Hematology and Oncology

## 2021-04-05 DIAGNOSIS — Z8739 Personal history of other diseases of the musculoskeletal system and connective tissue: Secondary | ICD-10-CM

## 2021-04-05 DIAGNOSIS — M479 Spondylosis, unspecified: Secondary | ICD-10-CM

## 2021-04-05 DIAGNOSIS — G8929 Other chronic pain: Secondary | ICD-10-CM

## 2021-04-06 ENCOUNTER — Other Ambulatory Visit: Payer: Self-pay | Admitting: Hematology and Oncology

## 2021-04-06 DIAGNOSIS — G8929 Other chronic pain: Secondary | ICD-10-CM

## 2021-04-06 DIAGNOSIS — Z8739 Personal history of other diseases of the musculoskeletal system and connective tissue: Secondary | ICD-10-CM

## 2021-04-06 DIAGNOSIS — M479 Spondylosis, unspecified: Secondary | ICD-10-CM

## 2021-04-06 MED ORDER — OXYCODONE-ACETAMINOPHEN 10-325 MG PO TABS: 1.0000 | ORAL_TABLET | Freq: Four times a day (QID) | ORAL | 0 refills | Status: DC | PRN

## 2021-04-06 MED ORDER — MORPHINE SULFATE ER 15 MG PO TBCR: 15.0000 mg | EXTENDED_RELEASE_TABLET | Freq: Two times a day (BID) | ORAL | 0 refills | Status: DC

## 2021-04-30 ENCOUNTER — Ambulatory Visit: Payer: Medicare Other | Admitting: Cardiology

## 2021-05-07 DIAGNOSIS — E538 Deficiency of other specified B group vitamins: Secondary | ICD-10-CM | POA: Diagnosis not present

## 2021-06-06 ENCOUNTER — Other Ambulatory Visit: Payer: Self-pay | Admitting: Hematology and Oncology

## 2021-06-06 DIAGNOSIS — Z8739 Personal history of other diseases of the musculoskeletal system and connective tissue: Secondary | ICD-10-CM

## 2021-06-06 DIAGNOSIS — M479 Spondylosis, unspecified: Secondary | ICD-10-CM

## 2021-06-06 DIAGNOSIS — M546 Pain in thoracic spine: Secondary | ICD-10-CM

## 2021-06-06 DIAGNOSIS — G8929 Other chronic pain: Secondary | ICD-10-CM

## 2021-06-06 MED ORDER — OXYCODONE-ACETAMINOPHEN 10-325 MG PO TABS: 1.0000 | ORAL_TABLET | Freq: Four times a day (QID) | ORAL | 0 refills | Status: DC | PRN

## 2021-06-06 MED ORDER — MORPHINE SULFATE ER 15 MG PO TBCR: 15.0000 mg | EXTENDED_RELEASE_TABLET | Freq: Two times a day (BID) | ORAL | 0 refills | Status: DC

## 2021-06-21 DIAGNOSIS — Z8673 Personal history of transient ischemic attack (TIA), and cerebral infarction without residual deficits: Secondary | ICD-10-CM | POA: Diagnosis not present

## 2021-06-21 DIAGNOSIS — E785 Hyperlipidemia, unspecified: Secondary | ICD-10-CM | POA: Diagnosis not present

## 2021-06-21 DIAGNOSIS — G4700 Insomnia, unspecified: Secondary | ICD-10-CM | POA: Diagnosis not present

## 2021-06-21 DIAGNOSIS — D509 Iron deficiency anemia, unspecified: Secondary | ICD-10-CM | POA: Diagnosis not present

## 2021-06-21 DIAGNOSIS — E538 Deficiency of other specified B group vitamins: Secondary | ICD-10-CM | POA: Diagnosis not present

## 2021-06-21 DIAGNOSIS — Z1231 Encounter for screening mammogram for malignant neoplasm of breast: Secondary | ICD-10-CM | POA: Diagnosis not present

## 2021-06-21 DIAGNOSIS — E559 Vitamin D deficiency, unspecified: Secondary | ICD-10-CM | POA: Diagnosis not present

## 2021-06-21 DIAGNOSIS — K5909 Other constipation: Secondary | ICD-10-CM | POA: Diagnosis not present

## 2021-07-04 ENCOUNTER — Other Ambulatory Visit: Payer: Self-pay

## 2021-07-04 DIAGNOSIS — G8929 Other chronic pain: Secondary | ICD-10-CM

## 2021-07-04 DIAGNOSIS — M479 Spondylosis, unspecified: Secondary | ICD-10-CM

## 2021-07-04 DIAGNOSIS — Z8739 Personal history of other diseases of the musculoskeletal system and connective tissue: Secondary | ICD-10-CM

## 2021-07-04 MED ORDER — MORPHINE SULFATE ER 15 MG PO TBCR: 15.0000 mg | EXTENDED_RELEASE_TABLET | Freq: Two times a day (BID) | ORAL | 0 refills | Status: DC

## 2021-07-04 MED ORDER — OXYCODONE-ACETAMINOPHEN 10-325 MG PO TABS: 1.0000 | ORAL_TABLET | Freq: Four times a day (QID) | ORAL | 0 refills | Status: DC | PRN

## 2021-07-14 DIAGNOSIS — R0602 Shortness of breath: Secondary | ICD-10-CM | POA: Diagnosis not present

## 2021-07-14 DIAGNOSIS — R059 Cough, unspecified: Secondary | ICD-10-CM | POA: Diagnosis not present

## 2021-07-14 DIAGNOSIS — Z20822 Contact with and (suspected) exposure to covid-19: Secondary | ICD-10-CM | POA: Diagnosis not present

## 2021-07-14 DIAGNOSIS — N3000 Acute cystitis without hematuria: Secondary | ICD-10-CM | POA: Diagnosis not present

## 2021-07-20 DIAGNOSIS — Z9181 History of falling: Secondary | ICD-10-CM | POA: Diagnosis not present

## 2021-07-20 DIAGNOSIS — E785 Hyperlipidemia, unspecified: Secondary | ICD-10-CM | POA: Diagnosis not present

## 2021-07-20 DIAGNOSIS — Z0000 Encounter for general adult medical examination without abnormal findings: Secondary | ICD-10-CM | POA: Diagnosis not present

## 2021-07-31 DIAGNOSIS — J9601 Acute respiratory failure with hypoxia: Secondary | ICD-10-CM | POA: Diagnosis not present

## 2021-07-31 DIAGNOSIS — I252 Old myocardial infarction: Secondary | ICD-10-CM | POA: Diagnosis not present

## 2021-07-31 DIAGNOSIS — I499 Cardiac arrhythmia, unspecified: Secondary | ICD-10-CM | POA: Diagnosis not present

## 2021-07-31 DIAGNOSIS — I5022 Chronic systolic (congestive) heart failure: Secondary | ICD-10-CM | POA: Diagnosis not present

## 2021-07-31 DIAGNOSIS — R062 Wheezing: Secondary | ICD-10-CM | POA: Diagnosis not present

## 2021-07-31 DIAGNOSIS — Z743 Need for continuous supervision: Secondary | ICD-10-CM | POA: Diagnosis not present

## 2021-07-31 DIAGNOSIS — R6889 Other general symptoms and signs: Secondary | ICD-10-CM | POA: Diagnosis not present

## 2021-07-31 DIAGNOSIS — E7800 Pure hypercholesterolemia, unspecified: Secondary | ICD-10-CM | POA: Diagnosis not present

## 2021-07-31 DIAGNOSIS — Z9221 Personal history of antineoplastic chemotherapy: Secondary | ICD-10-CM | POA: Diagnosis not present

## 2021-07-31 DIAGNOSIS — Z7952 Long term (current) use of systemic steroids: Secondary | ICD-10-CM | POA: Diagnosis not present

## 2021-07-31 DIAGNOSIS — M1990 Unspecified osteoarthritis, unspecified site: Secondary | ICD-10-CM | POA: Diagnosis not present

## 2021-07-31 DIAGNOSIS — Z85118 Personal history of other malignant neoplasm of bronchus and lung: Secondary | ICD-10-CM | POA: Diagnosis not present

## 2021-07-31 DIAGNOSIS — Z7982 Long term (current) use of aspirin: Secondary | ICD-10-CM | POA: Diagnosis not present

## 2021-07-31 DIAGNOSIS — I219 Acute myocardial infarction, unspecified: Secondary | ICD-10-CM | POA: Diagnosis not present

## 2021-07-31 DIAGNOSIS — G8929 Other chronic pain: Secondary | ICD-10-CM | POA: Diagnosis not present

## 2021-07-31 DIAGNOSIS — Z79899 Other long term (current) drug therapy: Secondary | ICD-10-CM | POA: Diagnosis not present

## 2021-07-31 DIAGNOSIS — I509 Heart failure, unspecified: Secondary | ICD-10-CM | POA: Diagnosis not present

## 2021-07-31 DIAGNOSIS — R112 Nausea with vomiting, unspecified: Secondary | ICD-10-CM | POA: Diagnosis not present

## 2021-07-31 DIAGNOSIS — I255 Ischemic cardiomyopathy: Secondary | ICD-10-CM | POA: Diagnosis not present

## 2021-07-31 DIAGNOSIS — J441 Chronic obstructive pulmonary disease with (acute) exacerbation: Secondary | ICD-10-CM | POA: Diagnosis not present

## 2021-07-31 DIAGNOSIS — R531 Weakness: Secondary | ICD-10-CM | POA: Diagnosis not present

## 2021-07-31 DIAGNOSIS — E860 Dehydration: Secondary | ICD-10-CM | POA: Diagnosis not present

## 2021-07-31 DIAGNOSIS — Z20822 Contact with and (suspected) exposure to covid-19: Secondary | ICD-10-CM | POA: Diagnosis not present

## 2021-07-31 DIAGNOSIS — Z87891 Personal history of nicotine dependence: Secondary | ICD-10-CM | POA: Diagnosis not present

## 2021-07-31 DIAGNOSIS — I110 Hypertensive heart disease with heart failure: Secondary | ICD-10-CM | POA: Diagnosis not present

## 2021-07-31 DIAGNOSIS — M545 Low back pain, unspecified: Secondary | ICD-10-CM | POA: Diagnosis not present

## 2021-07-31 DIAGNOSIS — Z79891 Long term (current) use of opiate analgesic: Secondary | ICD-10-CM | POA: Diagnosis not present

## 2021-07-31 DIAGNOSIS — Z923 Personal history of irradiation: Secondary | ICD-10-CM | POA: Diagnosis not present

## 2021-07-31 DIAGNOSIS — K219 Gastro-esophageal reflux disease without esophagitis: Secondary | ICD-10-CM | POA: Diagnosis not present

## 2021-07-31 DIAGNOSIS — I2510 Atherosclerotic heart disease of native coronary artery without angina pectoris: Secondary | ICD-10-CM | POA: Diagnosis not present

## 2021-07-31 DIAGNOSIS — Z7902 Long term (current) use of antithrombotics/antiplatelets: Secondary | ICD-10-CM | POA: Diagnosis not present

## 2021-07-31 DIAGNOSIS — R110 Nausea: Secondary | ICD-10-CM | POA: Diagnosis not present

## 2021-08-07 ENCOUNTER — Other Ambulatory Visit: Payer: Self-pay

## 2021-08-07 DIAGNOSIS — K219 Gastro-esophageal reflux disease without esophagitis: Secondary | ICD-10-CM | POA: Diagnosis not present

## 2021-08-07 DIAGNOSIS — Z85118 Personal history of other malignant neoplasm of bronchus and lung: Secondary | ICD-10-CM | POA: Diagnosis not present

## 2021-08-07 DIAGNOSIS — J441 Chronic obstructive pulmonary disease with (acute) exacerbation: Secondary | ICD-10-CM | POA: Diagnosis not present

## 2021-08-07 DIAGNOSIS — Z7902 Long term (current) use of antithrombotics/antiplatelets: Secondary | ICD-10-CM | POA: Diagnosis not present

## 2021-08-07 DIAGNOSIS — I2510 Atherosclerotic heart disease of native coronary artery without angina pectoris: Secondary | ICD-10-CM | POA: Diagnosis not present

## 2021-08-07 DIAGNOSIS — I252 Old myocardial infarction: Secondary | ICD-10-CM | POA: Diagnosis not present

## 2021-08-07 DIAGNOSIS — Z8739 Personal history of other diseases of the musculoskeletal system and connective tissue: Secondary | ICD-10-CM

## 2021-08-07 DIAGNOSIS — G8929 Other chronic pain: Secondary | ICD-10-CM | POA: Diagnosis not present

## 2021-08-07 DIAGNOSIS — M546 Pain in thoracic spine: Secondary | ICD-10-CM

## 2021-08-07 DIAGNOSIS — M5030 Other cervical disc degeneration, unspecified cervical region: Secondary | ICD-10-CM | POA: Diagnosis not present

## 2021-08-07 DIAGNOSIS — I5022 Chronic systolic (congestive) heart failure: Secondary | ICD-10-CM | POA: Diagnosis not present

## 2021-08-07 DIAGNOSIS — M479 Spondylosis, unspecified: Secondary | ICD-10-CM

## 2021-08-07 DIAGNOSIS — M1990 Unspecified osteoarthritis, unspecified site: Secondary | ICD-10-CM | POA: Diagnosis not present

## 2021-08-07 DIAGNOSIS — E7800 Pure hypercholesterolemia, unspecified: Secondary | ICD-10-CM | POA: Diagnosis not present

## 2021-08-07 DIAGNOSIS — I110 Hypertensive heart disease with heart failure: Secondary | ICD-10-CM | POA: Diagnosis not present

## 2021-08-07 MED ORDER — MORPHINE SULFATE ER 15 MG PO TBCR: 15.0000 mg | EXTENDED_RELEASE_TABLET | Freq: Two times a day (BID) | ORAL | 0 refills | Status: DC

## 2021-08-07 MED ORDER — OXYCODONE-ACETAMINOPHEN 10-325 MG PO TABS: 1.0000 | ORAL_TABLET | Freq: Four times a day (QID) | ORAL | 0 refills | Status: DC | PRN

## 2021-08-10 ENCOUNTER — Ambulatory Visit (INDEPENDENT_AMBULATORY_CARE_PROVIDER_SITE_OTHER): Payer: Medicare Other | Admitting: Cardiology

## 2021-08-10 ENCOUNTER — Encounter: Payer: Self-pay | Admitting: Cardiology

## 2021-08-10 ENCOUNTER — Other Ambulatory Visit: Payer: Self-pay

## 2021-08-10 VITALS — BP 142/92 | HR 96 | Ht 66.0 in | Wt 169.4 lb

## 2021-08-10 DIAGNOSIS — I252 Old myocardial infarction: Secondary | ICD-10-CM | POA: Diagnosis not present

## 2021-08-10 DIAGNOSIS — I739 Peripheral vascular disease, unspecified: Secondary | ICD-10-CM

## 2021-08-10 DIAGNOSIS — J441 Chronic obstructive pulmonary disease with (acute) exacerbation: Secondary | ICD-10-CM | POA: Diagnosis not present

## 2021-08-10 DIAGNOSIS — J449 Chronic obstructive pulmonary disease, unspecified: Secondary | ICD-10-CM | POA: Diagnosis not present

## 2021-08-10 DIAGNOSIS — G8929 Other chronic pain: Secondary | ICD-10-CM | POA: Diagnosis not present

## 2021-08-10 DIAGNOSIS — I2510 Atherosclerotic heart disease of native coronary artery without angina pectoris: Secondary | ICD-10-CM

## 2021-08-10 DIAGNOSIS — Z720 Tobacco use: Secondary | ICD-10-CM

## 2021-08-10 DIAGNOSIS — M1990 Unspecified osteoarthritis, unspecified site: Secondary | ICD-10-CM | POA: Diagnosis not present

## 2021-08-10 DIAGNOSIS — I10 Essential (primary) hypertension: Secondary | ICD-10-CM

## 2021-08-10 DIAGNOSIS — E7800 Pure hypercholesterolemia, unspecified: Secondary | ICD-10-CM | POA: Diagnosis not present

## 2021-08-10 DIAGNOSIS — I110 Hypertensive heart disease with heart failure: Secondary | ICD-10-CM | POA: Diagnosis not present

## 2021-08-10 DIAGNOSIS — I428 Other cardiomyopathies: Secondary | ICD-10-CM

## 2021-08-10 DIAGNOSIS — I5022 Chronic systolic (congestive) heart failure: Secondary | ICD-10-CM | POA: Diagnosis not present

## 2021-08-10 DIAGNOSIS — Z85118 Personal history of other malignant neoplasm of bronchus and lung: Secondary | ICD-10-CM | POA: Diagnosis not present

## 2021-08-10 DIAGNOSIS — M5030 Other cervical disc degeneration, unspecified cervical region: Secondary | ICD-10-CM | POA: Diagnosis not present

## 2021-08-10 DIAGNOSIS — K219 Gastro-esophageal reflux disease without esophagitis: Secondary | ICD-10-CM | POA: Diagnosis not present

## 2021-08-10 DIAGNOSIS — I255 Ischemic cardiomyopathy: Secondary | ICD-10-CM

## 2021-08-10 DIAGNOSIS — Z7902 Long term (current) use of antithrombotics/antiplatelets: Secondary | ICD-10-CM | POA: Diagnosis not present

## 2021-08-10 HISTORY — DX: Peripheral vascular disease, unspecified: I73.9

## 2021-08-10 NOTE — Progress Notes (Signed)
Cardiology Office Note:    Date:  08/10/2021   ID:  Ashley Miles, DOB March 19, 1946, MRN [id number]  PCP:  Penelope Coop, FNP  Cardiologist:  Jenne Campus, MD    Referring MD: Penelope Coop, FNP   Chief Complaint  Patient presents with   Hospitalization Follow-up    History of Present Illness:    Ashley Miles is a 76 y.o. female   past medical history significant for coronary artery disease, status post non-STEMI in 2019, she did have cardiomyopathy however her left ventricle ejection fraction improved to normalization, likely finally she quit smoking.  There is also some noncompliance. She comes today 2 months for follow-up.  Last time I seen her which was in March she had episodes of what look like suspicion for TIA.  CT of the head was done after that was unrevealing she did however have cardiac ultrasounds which show up to 79% stenosis on the right side.  Since that time I seen her there is no more episode of TIA or CVA.  The greatest news is the fact that she quit smoking.  She still complains of some weakness fatigue and cough but overall seems to be doing better.  Past Medical History:  Diagnosis Date   Acute blood loss anemia    Acute kidney failure (HCC)    Acute systolic heart failure (HCC)    Anemia, unspecified 07/11/2020   Anxiety    Arthritis    Cardiogenic shock (HCC)    CHF (congestive heart failure) ([place])    Chronic systolic heart failure ([place]) 03/03/2017   Cardiac catheterization 2018 showed diagonal branch occluded and right coronary artery stenosis however right coronary artery was very small, the feeling was that degree of coronary artery disease is low to her ejection fraction.   COPD (chronic obstructive pulmonary disease) ([place])    Coronary artery disease 03/03/2017   Cardiac catheterization June 2018 showing 90% small RCA, completely occluded diagonal branch. Unsuccessful attempt to open up diagonal branch.   Drug-induced constipation     Dyslipidemia 04/03/2017   Gastric ulcer    GERD (gastroesophageal reflux disease)    Hiatal hernia    Hyperlipidemia    Hypertension    Ischemic cardiomyopathy 05/21/2018   Lung cancer ([place]) 03/03/2017   NSTEMI (non-ST elevated myocardial infarction) ([place]) 01/09/2017   Osteopenia    Small cell lung cancer ([place])    Smoking 11/20/2017   T12 compression fracture (HCC)    Tobacco use     Past Surgical History:  Procedure Laterality Date   CESAREAN SECTION     ESOPHAGOGASTRODUODENOSCOPY  02/17/2017   Healing of the gastic ulcer- no antral scar. Small hiatal hernia   LEFT HEART CATH AND CORONARY ANGIOGRAPHY N/A 01/09/2017   Procedure: Left Heart Cath and Coronary Angiography;  Surgeon: Burnell Blanks, MD;  Location: [facility];  Service: Cardiovascular;  Laterality: N/A;   LUNG CANCER SURGERY  2014    Current Medications: Current Meds  Medication Sig   albuterol (VENTOLIN HFA) 108 (90 Base) MCG/ACT inhaler Inhale 2 puffs into the lungs as needed for shortness of breath.   buPROPion (WELLBUTRIN SR) 150 MG 12 hr tablet Take 150 mg by mouth 2 (two) times daily.   busPIRone (BUSPAR) 15 MG tablet Take 15 mg by mouth daily.   clopidogrel (PLAVIX) 75 MG tablet Take 1 tablet (75 mg total) by mouth daily.   dicyclomine (BENTYL) 10 MG capsule Take 10 mg by mouth every 6 (  six) hours.   doxepin (SINEQUAN) 10 MG capsule Take 10 mg by mouth daily.   doxylamine, Sleep, (UNISOM) 25 MG tablet Take 25 mg by mouth at bedtime as needed for sleep.   ezetimibe (ZETIA) 10 MG tablet Take 1 tablet (10 mg total) by mouth daily.   Ferrous Sulfate (FEROSUL PO) Take 325 mg by mouth daily.   lubiprostone (AMITIZA) 24 MCG capsule Take 24 mcg by mouth 2 (two) times daily with a meal.   metoprolol succinate (TOPROL-XL) 25 MG 24 hr tablet TAKE 1 TABLET BY MOUTH ONCE DAILY.   metoprolol succinate (TOPROL-XL) 25 MG 24 hr tablet Take 25 mg by mouth daily.   morphine (MS CONTIN) 15 MG 12 hr tablet Take 1  tablet (15 mg total) by mouth every 12 (twelve) hours.   naloxegol oxalate (MOVANTIK) 25 MG TABS tablet Take 25 mg by mouth daily.   nitroGLYCERIN (NITROSTAT) 0.4 MG SL tablet Place 1 tablet (0.4 mg total) under the tongue every 5 (five) minutes x 3 doses as needed for chest pain.   ondansetron (ZOFRAN ODT) 4 MG disintegrating tablet 4mg  ODT q4 hours prn nausea/vomit   oxyCODONE-acetaminophen (PERCOCET) 10-325 MG tablet Take 1 tablet by mouth every 6 (six) hours as needed. for pain (Patient taking differently: Take 1 tablet by mouth every 6 (six) hours as needed for pain. for pain)   pantoprazole (PROTONIX) 40 MG tablet Take 40 mg by mouth 2 (two) times daily.   polyethylene glycol (MIRALAX / GLYCOLAX) packet Take 17 g by mouth daily.   potassium chloride (K-DUR) 10 MEQ tablet Take 10 mEq by mouth daily.   prochlorperazine (COMPAZINE) 10 MG tablet Take 10 mg by mouth 3 (three) times daily as needed for nausea/vomiting, nausea or vomiting.   rosuvastatin (CRESTOR) 10 MG tablet Take 1 tablet by mouth daily.   tiZANidine (ZANAFLEX) 4 MG tablet Take 4 mg by mouth at bedtime as needed for muscle spasms.   Vitamin D, Ergocalciferol, (DRISDOL) 50000 units CAPS capsule Take 50,000 Units by mouth every 7 (seven) days.   [DISCONTINUED] MOVANTIK 25 MG TABS tablet TAKE 1 TABLET BY MOUTH ONCE DAILY.     Allergies:   Patient has no known allergies.   Social History   Socioeconomic History   Marital status: Divorced    Spouse name: Not on file   Number of children: Not on file   Years of education: Not on file   Highest education level: Not on file  Occupational History   Not on file  Tobacco Use   Smoking status: Every Day    Types: Cigarettes   Smokeless tobacco: Never  Vaping Use   Vaping Use: Former  Substance and Sexual Activity   Alcohol use: No   Drug use: No   Sexual activity: Not on file  Other Topics Concern   Not on file  Social History Narrative   Not on file   Social  Determinants of Health   Financial Resource Strain: Not on file  Food Insecurity: Not on file  Transportation Needs: Not on file  Physical Activity: Not on file  Stress: Not on file  Social Connections: Not on file     Family History: The patient's family history includes Blindness in her mother; Cancer in her father; Hypertension in her father. There is no history of Colon cancer or Esophageal cancer. ROS:   Please see the history of present illness.    All 14 point review of systems negative except as described per  history of present illness  EKGs/Labs/Other Studies Reviewed:      Recent Labs: 03/15/2021: ALT 11; BUN 13; Creatinine 1.3; Hemoglobin 12.8; Platelets 310; Potassium 4.3; Sodium 138  Recent Lipid Panel    Component Value Date/Time   CHOL 146 05/21/2018 1134   TRIG 194 (H) 05/21/2018 1134   HDL 47 05/21/2018 1134   CHOLHDL 3.1 05/21/2018 1134   CHOLHDL 5.0 01/11/2017 0521   VLDL 27 01/11/2017 0521   LDLCALC 60 05/21/2018 1134    Physical Exam:    VS:  BP (!) 142/92 (BP Location: Right Arm, Patient Position: Sitting)    Pulse 96    Ht 5\' 6"  (1.676 m)    Wt 169 lb 6.4 oz (76.8 kg)    SpO2 94%    BMI 27.34 kg/m     Wt Readings from Last 3 Encounters:  08/10/21 169 lb 6.4 oz (76.8 kg)  03/15/21 173 lb 6.4 oz (78.7 kg)  10/25/20 169 lb (76.7 kg)     GEN:  Well nourished, well developed in no acute distress HEENT: Normal NECK: No JVD; No carotid bruits LYMPHATICS: No lymphadenopathy CARDIAC: RRR, no murmurs, no rubs, no gallops RESPIRATORY:  Clear to auscultation without rales, wheezing or rhonchi  ABDOMEN: Soft, non-tender, non-distended MUSCULOSKELETAL:  No edema; No deformity  SKIN: Warm and dry LOWER EXTREMITIES: no swelling NEUROLOGIC:  Alert and oriented x 3 PSYCHIATRIC:  Normal affect   ASSESSMENT:    1. Nonischemic cardiomyopathy ([place])   2. Peripheral vascular disease, unspecified ([place])   3. Coronary artery disease involving native coronary  artery of native heart without angina pectoris   4. Ischemic cardiomyopathy   5. Primary hypertension   6. Chronic obstructive pulmonary disease, unspecified COPD type ([place])   7. Tobacco use    PLAN:    In order of problems listed above:  Nonischemic cardiomyopathy I will schedule her to have another echocardiogram done.  Last assessment last year showed improvement and normalization.  However that need to be checked recheck. Peripheral vascular disease: We will schedule her to have carotic ultrasounds done.  In the meantime she is on antiplatelet therapy which I will continue. Essential hypertension: Blood pressure well controlled continue present management. COPD advanced present noted.  Luckily she finally quit smoking after smoking for more than 60 years She was recently in the hospital for not cardiac related reasons.  I did review to hospitalization   Medication Adjustments/Labs and Tests Ordered: Current medicines are reviewed at length with the patient today.  Concerns regarding medicines are outlined above.  Orders Placed This Encounter  Procedures   TSH   EKG 12-Lead   ECHOCARDIOGRAM COMPLETE   VAS US CAROTID   Medication changes: No orders of the defined types were placed in this encounter.   Signed, Park Liter, MD, [facility] 08/10/2021 4:46 PM    [place]

## 2021-08-10 NOTE — Patient Instructions (Signed)
Medication Instructions:  Your physician recommends that you continue on your current medications as directed. Please refer to the Current Medication list given to you today.  *If you need a refill on your cardiac medications before your next appointment, please call your pharmacy*   Lab Work: Your physician recommends that you return for lab work in: [facility] today If you have labs (blood work) drawn today and your tests are completely normal, you will receive your results only by: [place] (if you have MyChart) OR A paper copy in the mail If you have any lab test that is abnormal or we need to change your treatment, we will call you to review the results.   Testing/Procedures: Your physician has requested that you have a carotid duplex. This test is an ultrasound of the carotid arteries in your neck. It looks at blood flow through these arteries that supply the brain with blood. Allow one hour for this exam. There are no restrictions or special instructions.   Your physician has requested that you have an echocardiogram. Echocardiography is a painless test that uses sound waves to create images of your heart. It provides your doctor with information about the size and shape of your heart and how well your hearts chambers and valves are working. This procedure takes approximately one hour. There are no restrictions for this procedure.    Follow-Up: At [facility], you and your health needs are our priority.  As part of our continuing mission to provide you with exceptional heart care, we have created designated Provider Care Teams.  These Care Teams include your primary Cardiologist (physician) and Advanced Practice Providers (APPs -  Physician Assistants and Nurse Practitioners) who all work together to provide you with the care you need, when you need it.  We recommend signing up for the patient portal called "MyChart".  Sign up information is provided on this After Visit Summary.   MyChart is used to connect with patients for Virtual Visits (Telemedicine).  Patients are able to view lab/test results, encounter notes, upcoming appointments, etc.  Non-urgent messages can be sent to your provider as well.   To learn more about what you can do with MyChart, go to [email or web address].    Your next appointment:   5 month(s)  The format for your next appointment:   In Person  Provider:   Jenne Campus, MD    Other Instructions

## 2021-08-11 LAB — TSH: TSH: 6.45 u[IU]/mL — ABNORMAL HIGH (ref 0.450–4.500)

## 2021-08-13 DIAGNOSIS — E7800 Pure hypercholesterolemia, unspecified: Secondary | ICD-10-CM | POA: Diagnosis not present

## 2021-08-13 DIAGNOSIS — I5022 Chronic systolic (congestive) heart failure: Secondary | ICD-10-CM | POA: Diagnosis not present

## 2021-08-13 DIAGNOSIS — K219 Gastro-esophageal reflux disease without esophagitis: Secondary | ICD-10-CM | POA: Diagnosis not present

## 2021-08-13 DIAGNOSIS — Z85118 Personal history of other malignant neoplasm of bronchus and lung: Secondary | ICD-10-CM | POA: Diagnosis not present

## 2021-08-13 DIAGNOSIS — M5030 Other cervical disc degeneration, unspecified cervical region: Secondary | ICD-10-CM | POA: Diagnosis not present

## 2021-08-13 DIAGNOSIS — I2510 Atherosclerotic heart disease of native coronary artery without angina pectoris: Secondary | ICD-10-CM | POA: Diagnosis not present

## 2021-08-13 DIAGNOSIS — Z7902 Long term (current) use of antithrombotics/antiplatelets: Secondary | ICD-10-CM | POA: Diagnosis not present

## 2021-08-13 DIAGNOSIS — M1990 Unspecified osteoarthritis, unspecified site: Secondary | ICD-10-CM | POA: Diagnosis not present

## 2021-08-13 DIAGNOSIS — J441 Chronic obstructive pulmonary disease with (acute) exacerbation: Secondary | ICD-10-CM | POA: Diagnosis not present

## 2021-08-13 DIAGNOSIS — G8929 Other chronic pain: Secondary | ICD-10-CM | POA: Diagnosis not present

## 2021-08-13 DIAGNOSIS — I252 Old myocardial infarction: Secondary | ICD-10-CM | POA: Diagnosis not present

## 2021-08-13 DIAGNOSIS — I110 Hypertensive heart disease with heart failure: Secondary | ICD-10-CM | POA: Diagnosis not present

## 2021-08-14 ENCOUNTER — Telehealth: Payer: Self-pay

## 2021-08-14 NOTE — Telephone Encounter (Signed)
Spoke with patient regarding results and recommendation.  Patient verbalizes understanding and is agreeable to plan of care. Advised patient to call back with any issues or concerns.  

## 2021-08-14 NOTE — Telephone Encounter (Signed)
-----   Message from Park Liter, MD sent at 08/14/2021 12:58 PM EST ----- TSH elevated so patient does have hypothyroidism please send copy to primary care physician to address that

## 2021-08-15 DIAGNOSIS — K219 Gastro-esophageal reflux disease without esophagitis: Secondary | ICD-10-CM | POA: Diagnosis not present

## 2021-08-15 DIAGNOSIS — M5030 Other cervical disc degeneration, unspecified cervical region: Secondary | ICD-10-CM | POA: Diagnosis not present

## 2021-08-15 DIAGNOSIS — I110 Hypertensive heart disease with heart failure: Secondary | ICD-10-CM | POA: Diagnosis not present

## 2021-08-15 DIAGNOSIS — G8929 Other chronic pain: Secondary | ICD-10-CM | POA: Diagnosis not present

## 2021-08-15 DIAGNOSIS — M1990 Unspecified osteoarthritis, unspecified site: Secondary | ICD-10-CM | POA: Diagnosis not present

## 2021-08-15 DIAGNOSIS — I2510 Atherosclerotic heart disease of native coronary artery without angina pectoris: Secondary | ICD-10-CM | POA: Diagnosis not present

## 2021-08-15 DIAGNOSIS — I252 Old myocardial infarction: Secondary | ICD-10-CM | POA: Diagnosis not present

## 2021-08-15 DIAGNOSIS — J441 Chronic obstructive pulmonary disease with (acute) exacerbation: Secondary | ICD-10-CM | POA: Diagnosis not present

## 2021-08-15 DIAGNOSIS — E7800 Pure hypercholesterolemia, unspecified: Secondary | ICD-10-CM | POA: Diagnosis not present

## 2021-08-15 DIAGNOSIS — I5022 Chronic systolic (congestive) heart failure: Secondary | ICD-10-CM | POA: Diagnosis not present

## 2021-08-15 DIAGNOSIS — Z7902 Long term (current) use of antithrombotics/antiplatelets: Secondary | ICD-10-CM | POA: Diagnosis not present

## 2021-08-15 DIAGNOSIS — Z85118 Personal history of other malignant neoplasm of bronchus and lung: Secondary | ICD-10-CM | POA: Diagnosis not present

## 2021-08-16 DIAGNOSIS — G8929 Other chronic pain: Secondary | ICD-10-CM | POA: Diagnosis not present

## 2021-08-16 DIAGNOSIS — I252 Old myocardial infarction: Secondary | ICD-10-CM | POA: Diagnosis not present

## 2021-08-16 DIAGNOSIS — I5022 Chronic systolic (congestive) heart failure: Secondary | ICD-10-CM | POA: Diagnosis not present

## 2021-08-16 DIAGNOSIS — M5030 Other cervical disc degeneration, unspecified cervical region: Secondary | ICD-10-CM | POA: Diagnosis not present

## 2021-08-16 DIAGNOSIS — E7800 Pure hypercholesterolemia, unspecified: Secondary | ICD-10-CM | POA: Diagnosis not present

## 2021-08-16 DIAGNOSIS — J441 Chronic obstructive pulmonary disease with (acute) exacerbation: Secondary | ICD-10-CM | POA: Diagnosis not present

## 2021-08-16 DIAGNOSIS — I110 Hypertensive heart disease with heart failure: Secondary | ICD-10-CM | POA: Diagnosis not present

## 2021-08-16 DIAGNOSIS — Z85118 Personal history of other malignant neoplasm of bronchus and lung: Secondary | ICD-10-CM | POA: Diagnosis not present

## 2021-08-16 DIAGNOSIS — Z7902 Long term (current) use of antithrombotics/antiplatelets: Secondary | ICD-10-CM | POA: Diagnosis not present

## 2021-08-16 DIAGNOSIS — K219 Gastro-esophageal reflux disease without esophagitis: Secondary | ICD-10-CM | POA: Diagnosis not present

## 2021-08-16 DIAGNOSIS — M1990 Unspecified osteoarthritis, unspecified site: Secondary | ICD-10-CM | POA: Diagnosis not present

## 2021-08-16 DIAGNOSIS — I2510 Atherosclerotic heart disease of native coronary artery without angina pectoris: Secondary | ICD-10-CM | POA: Diagnosis not present

## 2021-08-20 DIAGNOSIS — M1990 Unspecified osteoarthritis, unspecified site: Secondary | ICD-10-CM | POA: Diagnosis not present

## 2021-08-20 DIAGNOSIS — Z85118 Personal history of other malignant neoplasm of bronchus and lung: Secondary | ICD-10-CM | POA: Diagnosis not present

## 2021-08-20 DIAGNOSIS — E7800 Pure hypercholesterolemia, unspecified: Secondary | ICD-10-CM | POA: Diagnosis not present

## 2021-08-20 DIAGNOSIS — I2510 Atherosclerotic heart disease of native coronary artery without angina pectoris: Secondary | ICD-10-CM | POA: Diagnosis not present

## 2021-08-20 DIAGNOSIS — M5030 Other cervical disc degeneration, unspecified cervical region: Secondary | ICD-10-CM | POA: Diagnosis not present

## 2021-08-20 DIAGNOSIS — K219 Gastro-esophageal reflux disease without esophagitis: Secondary | ICD-10-CM | POA: Diagnosis not present

## 2021-08-20 DIAGNOSIS — I5022 Chronic systolic (congestive) heart failure: Secondary | ICD-10-CM | POA: Diagnosis not present

## 2021-08-20 DIAGNOSIS — I110 Hypertensive heart disease with heart failure: Secondary | ICD-10-CM | POA: Diagnosis not present

## 2021-08-20 DIAGNOSIS — J441 Chronic obstructive pulmonary disease with (acute) exacerbation: Secondary | ICD-10-CM | POA: Diagnosis not present

## 2021-08-20 DIAGNOSIS — Z7902 Long term (current) use of antithrombotics/antiplatelets: Secondary | ICD-10-CM | POA: Diagnosis not present

## 2021-08-20 DIAGNOSIS — I252 Old myocardial infarction: Secondary | ICD-10-CM | POA: Diagnosis not present

## 2021-08-20 DIAGNOSIS — G8929 Other chronic pain: Secondary | ICD-10-CM | POA: Diagnosis not present

## 2021-08-22 DIAGNOSIS — E7800 Pure hypercholesterolemia, unspecified: Secondary | ICD-10-CM | POA: Diagnosis not present

## 2021-08-22 DIAGNOSIS — J441 Chronic obstructive pulmonary disease with (acute) exacerbation: Secondary | ICD-10-CM | POA: Diagnosis not present

## 2021-08-22 DIAGNOSIS — Z85118 Personal history of other malignant neoplasm of bronchus and lung: Secondary | ICD-10-CM | POA: Diagnosis not present

## 2021-08-22 DIAGNOSIS — G8929 Other chronic pain: Secondary | ICD-10-CM | POA: Diagnosis not present

## 2021-08-22 DIAGNOSIS — Z7902 Long term (current) use of antithrombotics/antiplatelets: Secondary | ICD-10-CM | POA: Diagnosis not present

## 2021-08-22 DIAGNOSIS — I110 Hypertensive heart disease with heart failure: Secondary | ICD-10-CM | POA: Diagnosis not present

## 2021-08-22 DIAGNOSIS — I252 Old myocardial infarction: Secondary | ICD-10-CM | POA: Diagnosis not present

## 2021-08-22 DIAGNOSIS — I5022 Chronic systolic (congestive) heart failure: Secondary | ICD-10-CM | POA: Diagnosis not present

## 2021-08-22 DIAGNOSIS — K219 Gastro-esophageal reflux disease without esophagitis: Secondary | ICD-10-CM | POA: Diagnosis not present

## 2021-08-22 DIAGNOSIS — M5030 Other cervical disc degeneration, unspecified cervical region: Secondary | ICD-10-CM | POA: Diagnosis not present

## 2021-08-22 DIAGNOSIS — M1990 Unspecified osteoarthritis, unspecified site: Secondary | ICD-10-CM | POA: Diagnosis not present

## 2021-08-22 DIAGNOSIS — I2510 Atherosclerotic heart disease of native coronary artery without angina pectoris: Secondary | ICD-10-CM | POA: Diagnosis not present

## 2021-08-24 DIAGNOSIS — G8929 Other chronic pain: Secondary | ICD-10-CM | POA: Diagnosis not present

## 2021-08-24 DIAGNOSIS — Z7902 Long term (current) use of antithrombotics/antiplatelets: Secondary | ICD-10-CM | POA: Diagnosis not present

## 2021-08-24 DIAGNOSIS — E7800 Pure hypercholesterolemia, unspecified: Secondary | ICD-10-CM | POA: Diagnosis not present

## 2021-08-24 DIAGNOSIS — I2510 Atherosclerotic heart disease of native coronary artery without angina pectoris: Secondary | ICD-10-CM | POA: Diagnosis not present

## 2021-08-24 DIAGNOSIS — J441 Chronic obstructive pulmonary disease with (acute) exacerbation: Secondary | ICD-10-CM | POA: Diagnosis not present

## 2021-08-24 DIAGNOSIS — I5022 Chronic systolic (congestive) heart failure: Secondary | ICD-10-CM | POA: Diagnosis not present

## 2021-08-24 DIAGNOSIS — K219 Gastro-esophageal reflux disease without esophagitis: Secondary | ICD-10-CM | POA: Diagnosis not present

## 2021-08-24 DIAGNOSIS — M5030 Other cervical disc degeneration, unspecified cervical region: Secondary | ICD-10-CM | POA: Diagnosis not present

## 2021-08-24 DIAGNOSIS — M1990 Unspecified osteoarthritis, unspecified site: Secondary | ICD-10-CM | POA: Diagnosis not present

## 2021-08-24 DIAGNOSIS — I252 Old myocardial infarction: Secondary | ICD-10-CM | POA: Diagnosis not present

## 2021-08-24 DIAGNOSIS — I110 Hypertensive heart disease with heart failure: Secondary | ICD-10-CM | POA: Diagnosis not present

## 2021-08-24 DIAGNOSIS — Z85118 Personal history of other malignant neoplasm of bronchus and lung: Secondary | ICD-10-CM | POA: Diagnosis not present

## 2021-08-27 ENCOUNTER — Ambulatory Visit (INDEPENDENT_AMBULATORY_CARE_PROVIDER_SITE_OTHER): Payer: Medicare Other

## 2021-08-27 ENCOUNTER — Other Ambulatory Visit: Payer: Self-pay

## 2021-08-27 DIAGNOSIS — I6523 Occlusion and stenosis of bilateral carotid arteries: Secondary | ICD-10-CM | POA: Diagnosis not present

## 2021-08-27 DIAGNOSIS — I739 Peripheral vascular disease, unspecified: Secondary | ICD-10-CM

## 2021-08-27 DIAGNOSIS — I428 Other cardiomyopathies: Secondary | ICD-10-CM

## 2021-08-27 LAB — ECHOCARDIOGRAM COMPLETE
Area-P 1/2: 3.46 cm2
S' Lateral: 2.3 cm

## 2021-08-28 DIAGNOSIS — E7800 Pure hypercholesterolemia, unspecified: Secondary | ICD-10-CM | POA: Diagnosis not present

## 2021-08-28 DIAGNOSIS — M1990 Unspecified osteoarthritis, unspecified site: Secondary | ICD-10-CM | POA: Diagnosis not present

## 2021-08-28 DIAGNOSIS — K219 Gastro-esophageal reflux disease without esophagitis: Secondary | ICD-10-CM | POA: Diagnosis not present

## 2021-08-28 DIAGNOSIS — Z85118 Personal history of other malignant neoplasm of bronchus and lung: Secondary | ICD-10-CM | POA: Diagnosis not present

## 2021-08-28 DIAGNOSIS — I5022 Chronic systolic (congestive) heart failure: Secondary | ICD-10-CM | POA: Diagnosis not present

## 2021-08-28 DIAGNOSIS — I2510 Atherosclerotic heart disease of native coronary artery without angina pectoris: Secondary | ICD-10-CM | POA: Diagnosis not present

## 2021-08-28 DIAGNOSIS — Z7902 Long term (current) use of antithrombotics/antiplatelets: Secondary | ICD-10-CM | POA: Diagnosis not present

## 2021-08-28 DIAGNOSIS — I110 Hypertensive heart disease with heart failure: Secondary | ICD-10-CM | POA: Diagnosis not present

## 2021-08-28 DIAGNOSIS — M5030 Other cervical disc degeneration, unspecified cervical region: Secondary | ICD-10-CM | POA: Diagnosis not present

## 2021-08-28 DIAGNOSIS — G8929 Other chronic pain: Secondary | ICD-10-CM | POA: Diagnosis not present

## 2021-08-28 DIAGNOSIS — I252 Old myocardial infarction: Secondary | ICD-10-CM | POA: Diagnosis not present

## 2021-08-28 DIAGNOSIS — J441 Chronic obstructive pulmonary disease with (acute) exacerbation: Secondary | ICD-10-CM | POA: Diagnosis not present

## 2021-08-30 DIAGNOSIS — Z7902 Long term (current) use of antithrombotics/antiplatelets: Secondary | ICD-10-CM | POA: Diagnosis not present

## 2021-08-30 DIAGNOSIS — M1990 Unspecified osteoarthritis, unspecified site: Secondary | ICD-10-CM | POA: Diagnosis not present

## 2021-08-30 DIAGNOSIS — I252 Old myocardial infarction: Secondary | ICD-10-CM | POA: Diagnosis not present

## 2021-08-30 DIAGNOSIS — I110 Hypertensive heart disease with heart failure: Secondary | ICD-10-CM | POA: Diagnosis not present

## 2021-08-30 DIAGNOSIS — E7800 Pure hypercholesterolemia, unspecified: Secondary | ICD-10-CM | POA: Diagnosis not present

## 2021-08-30 DIAGNOSIS — I2510 Atherosclerotic heart disease of native coronary artery without angina pectoris: Secondary | ICD-10-CM | POA: Diagnosis not present

## 2021-08-30 DIAGNOSIS — J441 Chronic obstructive pulmonary disease with (acute) exacerbation: Secondary | ICD-10-CM | POA: Diagnosis not present

## 2021-08-30 DIAGNOSIS — I5022 Chronic systolic (congestive) heart failure: Secondary | ICD-10-CM | POA: Diagnosis not present

## 2021-08-30 DIAGNOSIS — K219 Gastro-esophageal reflux disease without esophagitis: Secondary | ICD-10-CM | POA: Diagnosis not present

## 2021-08-30 DIAGNOSIS — R7989 Other specified abnormal findings of blood chemistry: Secondary | ICD-10-CM | POA: Diagnosis not present

## 2021-08-30 DIAGNOSIS — E039 Hypothyroidism, unspecified: Secondary | ICD-10-CM | POA: Diagnosis not present

## 2021-08-30 DIAGNOSIS — G8929 Other chronic pain: Secondary | ICD-10-CM | POA: Diagnosis not present

## 2021-08-30 DIAGNOSIS — M5030 Other cervical disc degeneration, unspecified cervical region: Secondary | ICD-10-CM | POA: Diagnosis not present

## 2021-08-30 DIAGNOSIS — Z85118 Personal history of other malignant neoplasm of bronchus and lung: Secondary | ICD-10-CM | POA: Diagnosis not present

## 2021-08-31 DIAGNOSIS — G8929 Other chronic pain: Secondary | ICD-10-CM | POA: Diagnosis not present

## 2021-08-31 DIAGNOSIS — J441 Chronic obstructive pulmonary disease with (acute) exacerbation: Secondary | ICD-10-CM | POA: Diagnosis not present

## 2021-08-31 DIAGNOSIS — E7800 Pure hypercholesterolemia, unspecified: Secondary | ICD-10-CM | POA: Diagnosis not present

## 2021-08-31 DIAGNOSIS — Z7902 Long term (current) use of antithrombotics/antiplatelets: Secondary | ICD-10-CM | POA: Diagnosis not present

## 2021-08-31 DIAGNOSIS — I252 Old myocardial infarction: Secondary | ICD-10-CM | POA: Diagnosis not present

## 2021-08-31 DIAGNOSIS — I5022 Chronic systolic (congestive) heart failure: Secondary | ICD-10-CM | POA: Diagnosis not present

## 2021-08-31 DIAGNOSIS — I2510 Atherosclerotic heart disease of native coronary artery without angina pectoris: Secondary | ICD-10-CM | POA: Diagnosis not present

## 2021-08-31 DIAGNOSIS — I110 Hypertensive heart disease with heart failure: Secondary | ICD-10-CM | POA: Diagnosis not present

## 2021-08-31 DIAGNOSIS — Z85118 Personal history of other malignant neoplasm of bronchus and lung: Secondary | ICD-10-CM | POA: Diagnosis not present

## 2021-08-31 DIAGNOSIS — K219 Gastro-esophageal reflux disease without esophagitis: Secondary | ICD-10-CM | POA: Diagnosis not present

## 2021-08-31 DIAGNOSIS — M5030 Other cervical disc degeneration, unspecified cervical region: Secondary | ICD-10-CM | POA: Diagnosis not present

## 2021-08-31 DIAGNOSIS — M1990 Unspecified osteoarthritis, unspecified site: Secondary | ICD-10-CM | POA: Diagnosis not present

## 2021-09-03 DIAGNOSIS — G8929 Other chronic pain: Secondary | ICD-10-CM | POA: Diagnosis not present

## 2021-09-03 DIAGNOSIS — Z7902 Long term (current) use of antithrombotics/antiplatelets: Secondary | ICD-10-CM | POA: Diagnosis not present

## 2021-09-03 DIAGNOSIS — Z85118 Personal history of other malignant neoplasm of bronchus and lung: Secondary | ICD-10-CM | POA: Diagnosis not present

## 2021-09-03 DIAGNOSIS — I110 Hypertensive heart disease with heart failure: Secondary | ICD-10-CM | POA: Diagnosis not present

## 2021-09-03 DIAGNOSIS — I252 Old myocardial infarction: Secondary | ICD-10-CM | POA: Diagnosis not present

## 2021-09-03 DIAGNOSIS — M5030 Other cervical disc degeneration, unspecified cervical region: Secondary | ICD-10-CM | POA: Diagnosis not present

## 2021-09-03 DIAGNOSIS — M1990 Unspecified osteoarthritis, unspecified site: Secondary | ICD-10-CM | POA: Diagnosis not present

## 2021-09-03 DIAGNOSIS — I2510 Atherosclerotic heart disease of native coronary artery without angina pectoris: Secondary | ICD-10-CM | POA: Diagnosis not present

## 2021-09-03 DIAGNOSIS — K219 Gastro-esophageal reflux disease without esophagitis: Secondary | ICD-10-CM | POA: Diagnosis not present

## 2021-09-03 DIAGNOSIS — E7800 Pure hypercholesterolemia, unspecified: Secondary | ICD-10-CM | POA: Diagnosis not present

## 2021-09-03 DIAGNOSIS — I5022 Chronic systolic (congestive) heart failure: Secondary | ICD-10-CM | POA: Diagnosis not present

## 2021-09-03 DIAGNOSIS — J441 Chronic obstructive pulmonary disease with (acute) exacerbation: Secondary | ICD-10-CM | POA: Diagnosis not present

## 2021-09-04 DIAGNOSIS — Z7902 Long term (current) use of antithrombotics/antiplatelets: Secondary | ICD-10-CM | POA: Diagnosis not present

## 2021-09-04 DIAGNOSIS — I5022 Chronic systolic (congestive) heart failure: Secondary | ICD-10-CM | POA: Diagnosis not present

## 2021-09-04 DIAGNOSIS — K219 Gastro-esophageal reflux disease without esophagitis: Secondary | ICD-10-CM | POA: Diagnosis not present

## 2021-09-04 DIAGNOSIS — I252 Old myocardial infarction: Secondary | ICD-10-CM | POA: Diagnosis not present

## 2021-09-04 DIAGNOSIS — M1990 Unspecified osteoarthritis, unspecified site: Secondary | ICD-10-CM | POA: Diagnosis not present

## 2021-09-04 DIAGNOSIS — I2510 Atherosclerotic heart disease of native coronary artery without angina pectoris: Secondary | ICD-10-CM | POA: Diagnosis not present

## 2021-09-04 DIAGNOSIS — Z85118 Personal history of other malignant neoplasm of bronchus and lung: Secondary | ICD-10-CM | POA: Diagnosis not present

## 2021-09-04 DIAGNOSIS — J441 Chronic obstructive pulmonary disease with (acute) exacerbation: Secondary | ICD-10-CM | POA: Diagnosis not present

## 2021-09-04 DIAGNOSIS — M5030 Other cervical disc degeneration, unspecified cervical region: Secondary | ICD-10-CM | POA: Diagnosis not present

## 2021-09-04 DIAGNOSIS — G8929 Other chronic pain: Secondary | ICD-10-CM | POA: Diagnosis not present

## 2021-09-04 DIAGNOSIS — E7800 Pure hypercholesterolemia, unspecified: Secondary | ICD-10-CM | POA: Diagnosis not present

## 2021-09-04 DIAGNOSIS — I110 Hypertensive heart disease with heart failure: Secondary | ICD-10-CM | POA: Diagnosis not present

## 2021-09-05 ENCOUNTER — Other Ambulatory Visit: Payer: Self-pay | Admitting: Hematology and Oncology

## 2021-09-05 DIAGNOSIS — I110 Hypertensive heart disease with heart failure: Secondary | ICD-10-CM | POA: Diagnosis not present

## 2021-09-05 DIAGNOSIS — G8929 Other chronic pain: Secondary | ICD-10-CM

## 2021-09-05 DIAGNOSIS — I252 Old myocardial infarction: Secondary | ICD-10-CM | POA: Diagnosis not present

## 2021-09-05 DIAGNOSIS — E7800 Pure hypercholesterolemia, unspecified: Secondary | ICD-10-CM | POA: Diagnosis not present

## 2021-09-05 DIAGNOSIS — K219 Gastro-esophageal reflux disease without esophagitis: Secondary | ICD-10-CM | POA: Diagnosis not present

## 2021-09-05 DIAGNOSIS — Z8739 Personal history of other diseases of the musculoskeletal system and connective tissue: Secondary | ICD-10-CM

## 2021-09-05 DIAGNOSIS — M5030 Other cervical disc degeneration, unspecified cervical region: Secondary | ICD-10-CM | POA: Diagnosis not present

## 2021-09-05 DIAGNOSIS — M546 Pain in thoracic spine: Secondary | ICD-10-CM

## 2021-09-05 DIAGNOSIS — Z85118 Personal history of other malignant neoplasm of bronchus and lung: Secondary | ICD-10-CM | POA: Diagnosis not present

## 2021-09-05 DIAGNOSIS — J441 Chronic obstructive pulmonary disease with (acute) exacerbation: Secondary | ICD-10-CM | POA: Diagnosis not present

## 2021-09-05 DIAGNOSIS — I2510 Atherosclerotic heart disease of native coronary artery without angina pectoris: Secondary | ICD-10-CM | POA: Diagnosis not present

## 2021-09-05 DIAGNOSIS — I5022 Chronic systolic (congestive) heart failure: Secondary | ICD-10-CM | POA: Diagnosis not present

## 2021-09-05 DIAGNOSIS — M479 Spondylosis, unspecified: Secondary | ICD-10-CM

## 2021-09-05 DIAGNOSIS — M1990 Unspecified osteoarthritis, unspecified site: Secondary | ICD-10-CM | POA: Diagnosis not present

## 2021-09-05 DIAGNOSIS — Z7902 Long term (current) use of antithrombotics/antiplatelets: Secondary | ICD-10-CM | POA: Diagnosis not present

## 2021-09-06 ENCOUNTER — Other Ambulatory Visit: Payer: Self-pay | Admitting: Oncology

## 2021-09-06 DIAGNOSIS — Z8739 Personal history of other diseases of the musculoskeletal system and connective tissue: Secondary | ICD-10-CM

## 2021-09-06 DIAGNOSIS — G8929 Other chronic pain: Secondary | ICD-10-CM

## 2021-09-06 DIAGNOSIS — M479 Spondylosis, unspecified: Secondary | ICD-10-CM

## 2021-09-06 MED ORDER — OXYCODONE-ACETAMINOPHEN 10-325 MG PO TABS: 1.0000 | ORAL_TABLET | Freq: Four times a day (QID) | ORAL | 0 refills | Status: DC | PRN

## 2021-09-06 MED ORDER — MORPHINE SULFATE ER 15 MG PO TBCR: 15.0000 mg | EXTENDED_RELEASE_TABLET | Freq: Two times a day (BID) | ORAL | 0 refills | Status: DC

## 2021-09-07 DIAGNOSIS — J439 Emphysema, unspecified: Secondary | ICD-10-CM | POA: Diagnosis not present

## 2021-09-11 DIAGNOSIS — G8929 Other chronic pain: Secondary | ICD-10-CM | POA: Diagnosis not present

## 2021-09-11 DIAGNOSIS — I5022 Chronic systolic (congestive) heart failure: Secondary | ICD-10-CM | POA: Diagnosis not present

## 2021-09-11 DIAGNOSIS — I2510 Atherosclerotic heart disease of native coronary artery without angina pectoris: Secondary | ICD-10-CM | POA: Diagnosis not present

## 2021-09-11 DIAGNOSIS — Z7902 Long term (current) use of antithrombotics/antiplatelets: Secondary | ICD-10-CM | POA: Diagnosis not present

## 2021-09-11 DIAGNOSIS — M5030 Other cervical disc degeneration, unspecified cervical region: Secondary | ICD-10-CM | POA: Diagnosis not present

## 2021-09-11 DIAGNOSIS — E7800 Pure hypercholesterolemia, unspecified: Secondary | ICD-10-CM | POA: Diagnosis not present

## 2021-09-11 DIAGNOSIS — I110 Hypertensive heart disease with heart failure: Secondary | ICD-10-CM | POA: Diagnosis not present

## 2021-09-11 DIAGNOSIS — J441 Chronic obstructive pulmonary disease with (acute) exacerbation: Secondary | ICD-10-CM | POA: Diagnosis not present

## 2021-09-11 DIAGNOSIS — K219 Gastro-esophageal reflux disease without esophagitis: Secondary | ICD-10-CM | POA: Diagnosis not present

## 2021-09-11 DIAGNOSIS — M1990 Unspecified osteoarthritis, unspecified site: Secondary | ICD-10-CM | POA: Diagnosis not present

## 2021-09-11 DIAGNOSIS — I252 Old myocardial infarction: Secondary | ICD-10-CM | POA: Diagnosis not present

## 2021-09-11 DIAGNOSIS — Z85118 Personal history of other malignant neoplasm of bronchus and lung: Secondary | ICD-10-CM | POA: Diagnosis not present

## 2021-09-12 DIAGNOSIS — Z7902 Long term (current) use of antithrombotics/antiplatelets: Secondary | ICD-10-CM | POA: Diagnosis not present

## 2021-09-12 DIAGNOSIS — I2510 Atherosclerotic heart disease of native coronary artery without angina pectoris: Secondary | ICD-10-CM | POA: Diagnosis not present

## 2021-09-12 DIAGNOSIS — I5022 Chronic systolic (congestive) heart failure: Secondary | ICD-10-CM | POA: Diagnosis not present

## 2021-09-12 DIAGNOSIS — I252 Old myocardial infarction: Secondary | ICD-10-CM | POA: Diagnosis not present

## 2021-09-12 DIAGNOSIS — M5030 Other cervical disc degeneration, unspecified cervical region: Secondary | ICD-10-CM | POA: Diagnosis not present

## 2021-09-12 DIAGNOSIS — Z85118 Personal history of other malignant neoplasm of bronchus and lung: Secondary | ICD-10-CM | POA: Diagnosis not present

## 2021-09-12 DIAGNOSIS — J441 Chronic obstructive pulmonary disease with (acute) exacerbation: Secondary | ICD-10-CM | POA: Diagnosis not present

## 2021-09-12 DIAGNOSIS — G8929 Other chronic pain: Secondary | ICD-10-CM | POA: Diagnosis not present

## 2021-09-12 DIAGNOSIS — K219 Gastro-esophageal reflux disease without esophagitis: Secondary | ICD-10-CM | POA: Diagnosis not present

## 2021-09-12 DIAGNOSIS — I110 Hypertensive heart disease with heart failure: Secondary | ICD-10-CM | POA: Diagnosis not present

## 2021-09-12 DIAGNOSIS — E7800 Pure hypercholesterolemia, unspecified: Secondary | ICD-10-CM | POA: Diagnosis not present

## 2021-09-12 DIAGNOSIS — M1990 Unspecified osteoarthritis, unspecified site: Secondary | ICD-10-CM | POA: Diagnosis not present

## 2021-09-12 NOTE — Progress Notes (Incomplete)
[facility]  [address] [place],  [place]  [address] [phone]  Clinic Day:  09/12/2021  Referring physician: Penelope Coop, FNP   This document serves as a record of services personally performed by Hosie Poisson, MD. It was created on their behalf by Curry,Lauren E, a trained medical scribe. The creation of this record is based on the scribe's personal observations and the provider's statements to them.  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 She was treated with concurrent radiation and chemotherapy, which included 6 cycles of carboplatin and Etoposide, that was completed in October 2014. She has restaging scans every 3 months that have remained stable.  A PET scan in March 2015 showed only mild to moderate metabolic activity in the area of her original tumor.  She does have degenerative disc disease and osteoarthritis with chronic bone pain.  We have agreed to prescribe pain medication for her as she is unable to obtain a primary care provider.  In addition, she has significant osteopenia and was placed on Fosamax along with calcium and vitamin-D.  She continues to complain of lower back pain, and rates it as an 8 and constant..  She states the Percocet 10/325 gives her relief but just for a short period of time.  In June of 2017, she was complaining of a catch under both breasts and some increased cough with occasional chest pain.  At that time her CT scan did show progressive collapse and consolidation of the left upper lobe and so a PET scan was done to evaluate this further.  This revealed mild hypermetabolism within the posterior left upper hemithorax consistent with radiation treatment  changes and appeared relatively uniform with no areas of intense uptake suspicious for malignancy.  In December of 2017, she had a rib fracture that was found on x-ray at urgent care. She was also put on MS Contin 15mg  bid. She was seen by Dr. Jackquline Denmark and found to have a large gastric ulcer with bleeding.  She was placed on Protonix twice daily and had a follow-up upper endoscopy in July of 2018, which showed good healing of her ulcer, now consistent with an antral scar, and a small hiatal hernia. .  When I saw her in April, her hemoglobin was up to 11.3 but was back down to 9.1 on July 16th.  Biopsies revealed a reactive gastropathy and was negative for H pylori.  She is on iron supplement. She has had 2 myocardial infarctions, one in March of 2019 and another in June, and was transferred to [facility] in [place].  A chest x-ray done at that time showed just a chronic opacity at the left lung apex consistent with her treated lung cancer.  She also had an echocardiogram which revealed severe impairment of her left ventricular function with an ejection fraction of 30-35%.  She had akinesis of multiple areas of the myocardium.  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  It also revealed a small stable pericardial effusion.  ECHO from July 2021 revealed a good ejection fraction of 60-65% and small stable  pericardial effusion.  CT chest from November 2021 revealed stable dense radiation fibrosis involving the left paramediastinal lung and left hilum with no findings suspicious for recurrent tumor.  Stable emphysematous changes and pulmonary scarring and stable 3 mm subpleural right lower lobe pulmonary nodule, likely benign lymph node, was also seen.  Annual mammogram from December 2021 was negative.   INTERVAL HISTORY:  Ashley Miles is here for routine follow up and states  that she has been doing fairly well.  Her back pain is fairly well controlled with oxycodone 10/325 and she averages 3-4 daily depending on her activity.  Blood counts and chemistries are unremarkable except for a creatinine of 1.3, improved from 1.4.  Her  appetite is good, and she has gained nearly 5 pounds since her last visit.  She denies fever, chills or other signs of infection.  She denies nausea, vomiting, bowel issues, or abdominal pain.  She denies sore throat, cough, dyspnea, or chest pain.  Ashley Miles is here for routine follow up ***. CT chest from *** reveals ***.   Her  appetite is good, and she has gained/lost _ pounds since her last visit.  She denies fever, chills or other signs of infection.  She denies nausea, vomiting, bowel issues, or abdominal pain.  She denies sore throat, cough, dyspnea, or chest pain.  REVIEW OF SYSTEMS:  Review of Systems  Constitutional: Negative.  Negative for appetite change, chills, fatigue, fever and unexpected weight change.  HENT:  Negative.    Eyes: Negative.   Respiratory: Negative.  Negative for chest tightness, cough, hemoptysis, shortness of breath and wheezing.   Cardiovascular: Negative.  Negative for chest pain, leg swelling and palpitations.  Gastrointestinal: Negative.  Negative for abdominal distention, abdominal pain, blood in stool, constipation, diarrhea, nausea and vomiting.  Endocrine: Negative.   Genitourinary: Negative.  Negative for difficulty urinating, dysuria, frequency and hematuria.   Musculoskeletal:  Positive for back pain (well controlled with oxycodone). Negative for arthralgias, flank pain, gait problem and myalgias.  Skin: Negative.   Neurological: Negative.  Negative for dizziness, extremity weakness, gait problem, headaches, light-headedness, numbness, seizures and speech difficulty.  Hematological: Negative.   Psychiatric/Behavioral: Negative.  Negative for depression and sleep disturbance. The patient is not  nervous/anxious.     VITALS:  There were no vitals taken for this visit.  Wt Readings from Last 3 Encounters:  08/10/21 169 lb 6.4 oz (76.8 kg)  03/15/21 173 lb 6.4 oz (78.7 kg)  10/25/20 169 lb (76.7 kg)    There is no height or weight on file to calculate BMI.  Performance status (ECOG): 1 - Symptomatic but completely ambulatory  PHYSICAL EXAM:  Physical Exam Constitutional:      General: She is not in acute distress.    Appearance: Normal appearance. She is normal weight.  HENT:     Head: Normocephalic and atraumatic.  Eyes:     General: No scleral icterus.    Extraocular Movements: Extraocular movements intact.     Conjunctiva/sclera: Conjunctivae normal.     Pupils: Pupils are equal, round, and reactive to light.  Cardiovascular:     Rate and Rhythm: Normal rate and regular rhythm.     Pulses: Normal pulses.     Heart sounds: Normal heart sounds. No murmur heard.   No friction rub. No gallop.  Pulmonary:     Effort: Pulmonary effort is normal. No respiratory distress.     Breath sounds: Normal breath sounds.  Abdominal:     General: Bowel  sounds are normal. There is no distension.     Palpations: Abdomen is soft. There is no hepatomegaly, splenomegaly or mass.     Tenderness: There is no abdominal tenderness.  Musculoskeletal:        General: Normal range of motion.     Cervical back: Normal range of motion and neck supple.     Right lower leg: No edema.     Left lower leg: No edema.  Lymphadenopathy:     Cervical: No cervical adenopathy.  Skin:    General: Skin is warm and dry.  Neurological:     General: No focal deficit present.     Mental Status: She is alert and oriented to person, place, and time. Mental status is at baseline.  Psychiatric:        Mood and Affect: Mood normal.        Behavior: Behavior normal.        Thought Content: Thought content normal.        Judgment: Judgment normal.    LABS:   CBC Latest Ref Rng & Units 03/15/2021 08/06/2020  02/03/2017  WBC - 8.7 10.4 8.2  Hemoglobin 12.0 - 16.0 12.8 13.9 10.7(L)  Hematocrit 36 - 46 39 40.6 34.1  Platelets 150 - 399 310 309 406(H)   CMP Latest Ref Rng & Units 03/15/2021 08/06/2020 05/21/2018  Glucose 70 - 99 mg/dL - 165(H) 90  BUN 4 - 21 13 13 14   Creatinine 0.5 - 1.1 1.3(A) 1.42(H) 1.65(H)  Sodium 137 - 147 138 141 138  Potassium 3.4 - 5.3 4.3 3.9 5.4(H)  Chloride 99 - 108 109(A) 105 98  CO2 13 - 22 18 22 25   Calcium 8.7 - 10.7 9.3 10.0 9.6  Total Protein 6.5 - 8.1 g/dL - 7.6 -  Total Bilirubin 0.3 - 1.2 mg/dL - 0.6 -  Alkaline Phos 25 - 125 57 50 -  AST 13 - 35 23 22 -  ALT 7 - 35 11 11 -     Lab Results  Component Value Date   CEA1 2.5 03/15/2021   /  CEA  Date Value Ref Range Status  03/15/2021 2.5 0.0 - 4.7 ng/mL Final    Comment:    (NOTE)                             Nonsmokers          <3.9                             Smokers             <5.6 [employer] Electrochemiluminescence Immunoassay (ECLIA) Values obtained with different assay methods or kits cannot be used interchangeably.  Results cannot be interpreted as absolute evidence of the presence or absence of malignant disease. Performed At: [facility] [place], [place] [id number] Rush Farmer MD XN:[id number]      STUDIES:    Allergies: No Known Allergies  Current Medications: Current Outpatient Medications  Medication Sig Dispense Refill   albuterol (VENTOLIN HFA) 108 (90 Base) MCG/ACT inhaler Inhale 2 puffs into the lungs as needed for shortness of breath.     buPROPion (WELLBUTRIN SR) 150 MG 12 hr tablet Take 150 mg by mouth 2 (two) times daily.     busPIRone (BUSPAR) 15 MG tablet Take 15 mg by mouth daily.  clopidogrel (PLAVIX) 75 MG tablet Take 1 tablet (75 mg total) by mouth daily. 30 tablet 6   dicyclomine (BENTYL) 10 MG capsule Take 10 mg by mouth every 6 (six) hours.     doxepin (SINEQUAN) 10 MG capsule Take 10 mg by mouth daily.      doxylamine, Sleep, (UNISOM) 25 MG tablet Take 25 mg by mouth at bedtime as needed for sleep.     ezetimibe (ZETIA) 10 MG tablet Take 1 tablet (10 mg total) by mouth daily. 30 tablet 6   Ferrous Sulfate (FEROSUL PO) Take 325 mg by mouth daily.     lubiprostone (AMITIZA) 24 MCG capsule Take 24 mcg by mouth 2 (two) times daily with a meal.     metoprolol succinate (TOPROL-XL) 25 MG 24 hr tablet TAKE 1 TABLET BY MOUTH ONCE DAILY. 30 tablet 0   metoprolol succinate (TOPROL-XL) 25 MG 24 hr tablet Take 25 mg by mouth daily.     morphine (MS CONTIN) 15 MG 12 hr tablet Take 1 tablet (15 mg total) by mouth every 12 (twelve) hours. 60 tablet 0   naloxegol oxalate (MOVANTIK) 25 MG TABS tablet Take 25 mg by mouth daily.     nitroGLYCERIN (NITROSTAT) 0.4 MG SL tablet Place 1 tablet (0.4 mg total) under the tongue every 5 (five) minutes x 3 doses as needed for chest pain. 25 tablet 12   ondansetron (ZOFRAN ODT) 4 MG disintegrating tablet 4mg  ODT q4 hours prn nausea/vomit 8 tablet 0   oxyCODONE-acetaminophen (PERCOCET) 10-325 MG tablet Take 1 tablet by mouth every 6 (six) hours as needed. for pain 120 tablet 0   pantoprazole (PROTONIX) 40 MG tablet Take 40 mg by mouth 2 (two) times daily.     polyethylene glycol (MIRALAX / GLYCOLAX) packet Take 17 g by mouth daily.     potassium chloride (K-DUR) 10 MEQ tablet Take 10 mEq by mouth daily.     prochlorperazine (COMPAZINE) 10 MG tablet Take 10 mg by mouth 3 (three) times daily as needed for nausea/vomiting, nausea or vomiting.     rosuvastatin (CRESTOR) 10 MG tablet Take 1 tablet by mouth daily.     tiZANidine (ZANAFLEX) 4 MG tablet Take 4 mg by mouth at bedtime as needed for muscle spasms.     Vitamin D, Ergocalciferol, (DRISDOL) 50000 units CAPS capsule Take 50,000 Units by mouth every 7 (seven) days.     No current facility-administered medications for this visit.     ASSESSMENT & PLAN:   Assessment:   1.  Stage IIIB limited stage small cell carcinoma of  the lung diagnosed in June 2014.  This was treated with chemotherapy and radiation.  She remains without evidence of disease.    2.  Tiny stable pulmonary nodules.  3.  Chronic back pain from severe degenerative disc disease and osteoarthritis.  She continues oxycodone 10/325 with fairly good control of her pain.  4.  Stable small to moderate pericardial effusion.  ECHO from July 2021 revealed an EF between 60-65%.  Plan: We flushed her port today, and will repeat this at every visit.  I will see her back in 6 months with CBC, comprehensive metabolic profile, CEA and CT chest for examination and port flush.  She understands and agrees with this plan of care.   I provided 15 minutes of face-to-face time during this this encounter and > 50% was spent counseling as documented under my assessment and plan.    Derwood Kaplan, MD [facility]  [place] [address] Dept: [phone] Dept Fax: [phone]   I, Rita Ohara, am acting as scribe for Derwood Kaplan, MD  I have reviewed this report as typed by the medical scribe, and it is complete and accurate.

## 2021-09-13 DIAGNOSIS — G8929 Other chronic pain: Secondary | ICD-10-CM | POA: Diagnosis not present

## 2021-09-13 DIAGNOSIS — M1990 Unspecified osteoarthritis, unspecified site: Secondary | ICD-10-CM | POA: Diagnosis not present

## 2021-09-13 DIAGNOSIS — E7800 Pure hypercholesterolemia, unspecified: Secondary | ICD-10-CM | POA: Diagnosis not present

## 2021-09-13 DIAGNOSIS — J441 Chronic obstructive pulmonary disease with (acute) exacerbation: Secondary | ICD-10-CM | POA: Diagnosis not present

## 2021-09-13 DIAGNOSIS — I5022 Chronic systolic (congestive) heart failure: Secondary | ICD-10-CM | POA: Diagnosis not present

## 2021-09-13 DIAGNOSIS — K219 Gastro-esophageal reflux disease without esophagitis: Secondary | ICD-10-CM | POA: Diagnosis not present

## 2021-09-13 DIAGNOSIS — I2510 Atherosclerotic heart disease of native coronary artery without angina pectoris: Secondary | ICD-10-CM | POA: Diagnosis not present

## 2021-09-13 DIAGNOSIS — M5030 Other cervical disc degeneration, unspecified cervical region: Secondary | ICD-10-CM | POA: Diagnosis not present

## 2021-09-13 DIAGNOSIS — Z7902 Long term (current) use of antithrombotics/antiplatelets: Secondary | ICD-10-CM | POA: Diagnosis not present

## 2021-09-13 DIAGNOSIS — I110 Hypertensive heart disease with heart failure: Secondary | ICD-10-CM | POA: Diagnosis not present

## 2021-09-13 DIAGNOSIS — I252 Old myocardial infarction: Secondary | ICD-10-CM | POA: Diagnosis not present

## 2021-09-13 DIAGNOSIS — Z85118 Personal history of other malignant neoplasm of bronchus and lung: Secondary | ICD-10-CM | POA: Diagnosis not present

## 2021-09-14 DIAGNOSIS — E7800 Pure hypercholesterolemia, unspecified: Secondary | ICD-10-CM | POA: Diagnosis not present

## 2021-09-14 DIAGNOSIS — G8929 Other chronic pain: Secondary | ICD-10-CM | POA: Diagnosis not present

## 2021-09-14 DIAGNOSIS — I2510 Atherosclerotic heart disease of native coronary artery without angina pectoris: Secondary | ICD-10-CM | POA: Diagnosis not present

## 2021-09-14 DIAGNOSIS — M1990 Unspecified osteoarthritis, unspecified site: Secondary | ICD-10-CM | POA: Diagnosis not present

## 2021-09-14 DIAGNOSIS — Z85118 Personal history of other malignant neoplasm of bronchus and lung: Secondary | ICD-10-CM | POA: Diagnosis not present

## 2021-09-14 DIAGNOSIS — I5022 Chronic systolic (congestive) heart failure: Secondary | ICD-10-CM | POA: Diagnosis not present

## 2021-09-14 DIAGNOSIS — Z7902 Long term (current) use of antithrombotics/antiplatelets: Secondary | ICD-10-CM | POA: Diagnosis not present

## 2021-09-14 DIAGNOSIS — I252 Old myocardial infarction: Secondary | ICD-10-CM | POA: Diagnosis not present

## 2021-09-14 DIAGNOSIS — J441 Chronic obstructive pulmonary disease with (acute) exacerbation: Secondary | ICD-10-CM | POA: Diagnosis not present

## 2021-09-14 DIAGNOSIS — I110 Hypertensive heart disease with heart failure: Secondary | ICD-10-CM | POA: Diagnosis not present

## 2021-09-14 DIAGNOSIS — M5030 Other cervical disc degeneration, unspecified cervical region: Secondary | ICD-10-CM | POA: Diagnosis not present

## 2021-09-14 DIAGNOSIS — K219 Gastro-esophageal reflux disease without esophagitis: Secondary | ICD-10-CM | POA: Diagnosis not present

## 2021-09-17 ENCOUNTER — Telehealth: Payer: Self-pay | Admitting: Oncology

## 2021-09-17 ENCOUNTER — Ambulatory Visit: Payer: Medicare Other | Admitting: Oncology

## 2021-09-17 NOTE — Telephone Encounter (Signed)
LVM for patient to call office back.  Derwood Kaplan, MD  Neva Seat; [place], [place] Should we see if she wants to reschedule?        Previous Messages   ----- Message -----  From: Hermina Barters  Sent: 09/14/2021   5:49 PM EST  To: Derwood Kaplan, MD  Subject: CT imaging                                     Looks like she was suppose to get CT scans and labs today, but did not show up. She is scheduled Monday at 2:30.

## 2021-09-18 DIAGNOSIS — I110 Hypertensive heart disease with heart failure: Secondary | ICD-10-CM | POA: Diagnosis not present

## 2021-09-18 DIAGNOSIS — I252 Old myocardial infarction: Secondary | ICD-10-CM | POA: Diagnosis not present

## 2021-09-18 DIAGNOSIS — M5030 Other cervical disc degeneration, unspecified cervical region: Secondary | ICD-10-CM | POA: Diagnosis not present

## 2021-09-18 DIAGNOSIS — K219 Gastro-esophageal reflux disease without esophagitis: Secondary | ICD-10-CM | POA: Diagnosis not present

## 2021-09-18 DIAGNOSIS — G8929 Other chronic pain: Secondary | ICD-10-CM | POA: Diagnosis not present

## 2021-09-18 DIAGNOSIS — M1990 Unspecified osteoarthritis, unspecified site: Secondary | ICD-10-CM | POA: Diagnosis not present

## 2021-09-18 DIAGNOSIS — I2510 Atherosclerotic heart disease of native coronary artery without angina pectoris: Secondary | ICD-10-CM | POA: Diagnosis not present

## 2021-09-18 DIAGNOSIS — Z7902 Long term (current) use of antithrombotics/antiplatelets: Secondary | ICD-10-CM | POA: Diagnosis not present

## 2021-09-18 DIAGNOSIS — J441 Chronic obstructive pulmonary disease with (acute) exacerbation: Secondary | ICD-10-CM | POA: Diagnosis not present

## 2021-09-18 DIAGNOSIS — Z85118 Personal history of other malignant neoplasm of bronchus and lung: Secondary | ICD-10-CM | POA: Diagnosis not present

## 2021-09-18 DIAGNOSIS — E7800 Pure hypercholesterolemia, unspecified: Secondary | ICD-10-CM | POA: Diagnosis not present

## 2021-09-18 DIAGNOSIS — I5022 Chronic systolic (congestive) heart failure: Secondary | ICD-10-CM | POA: Diagnosis not present

## 2021-09-19 DIAGNOSIS — E7800 Pure hypercholesterolemia, unspecified: Secondary | ICD-10-CM | POA: Diagnosis not present

## 2021-09-19 DIAGNOSIS — I2510 Atherosclerotic heart disease of native coronary artery without angina pectoris: Secondary | ICD-10-CM | POA: Diagnosis not present

## 2021-09-19 DIAGNOSIS — I252 Old myocardial infarction: Secondary | ICD-10-CM | POA: Diagnosis not present

## 2021-09-19 DIAGNOSIS — Z7902 Long term (current) use of antithrombotics/antiplatelets: Secondary | ICD-10-CM | POA: Diagnosis not present

## 2021-09-19 DIAGNOSIS — Z85118 Personal history of other malignant neoplasm of bronchus and lung: Secondary | ICD-10-CM | POA: Diagnosis not present

## 2021-09-19 DIAGNOSIS — G8929 Other chronic pain: Secondary | ICD-10-CM | POA: Diagnosis not present

## 2021-09-19 DIAGNOSIS — M5030 Other cervical disc degeneration, unspecified cervical region: Secondary | ICD-10-CM | POA: Diagnosis not present

## 2021-09-19 DIAGNOSIS — I110 Hypertensive heart disease with heart failure: Secondary | ICD-10-CM | POA: Diagnosis not present

## 2021-09-19 DIAGNOSIS — K219 Gastro-esophageal reflux disease without esophagitis: Secondary | ICD-10-CM | POA: Diagnosis not present

## 2021-09-19 DIAGNOSIS — J441 Chronic obstructive pulmonary disease with (acute) exacerbation: Secondary | ICD-10-CM | POA: Diagnosis not present

## 2021-09-19 DIAGNOSIS — M1990 Unspecified osteoarthritis, unspecified site: Secondary | ICD-10-CM | POA: Diagnosis not present

## 2021-09-19 DIAGNOSIS — I5022 Chronic systolic (congestive) heart failure: Secondary | ICD-10-CM | POA: Diagnosis not present

## 2021-09-20 DIAGNOSIS — G8929 Other chronic pain: Secondary | ICD-10-CM | POA: Diagnosis not present

## 2021-09-20 DIAGNOSIS — M1990 Unspecified osteoarthritis, unspecified site: Secondary | ICD-10-CM | POA: Diagnosis not present

## 2021-09-20 DIAGNOSIS — M5030 Other cervical disc degeneration, unspecified cervical region: Secondary | ICD-10-CM | POA: Diagnosis not present

## 2021-09-20 DIAGNOSIS — K219 Gastro-esophageal reflux disease without esophagitis: Secondary | ICD-10-CM | POA: Diagnosis not present

## 2021-09-20 DIAGNOSIS — I2510 Atherosclerotic heart disease of native coronary artery without angina pectoris: Secondary | ICD-10-CM | POA: Diagnosis not present

## 2021-09-20 DIAGNOSIS — I252 Old myocardial infarction: Secondary | ICD-10-CM | POA: Diagnosis not present

## 2021-09-20 DIAGNOSIS — E7800 Pure hypercholesterolemia, unspecified: Secondary | ICD-10-CM | POA: Diagnosis not present

## 2021-09-20 DIAGNOSIS — J441 Chronic obstructive pulmonary disease with (acute) exacerbation: Secondary | ICD-10-CM | POA: Diagnosis not present

## 2021-09-20 DIAGNOSIS — Z7902 Long term (current) use of antithrombotics/antiplatelets: Secondary | ICD-10-CM | POA: Diagnosis not present

## 2021-09-20 DIAGNOSIS — I110 Hypertensive heart disease with heart failure: Secondary | ICD-10-CM | POA: Diagnosis not present

## 2021-09-20 DIAGNOSIS — I5022 Chronic systolic (congestive) heart failure: Secondary | ICD-10-CM | POA: Diagnosis not present

## 2021-09-20 DIAGNOSIS — Z85118 Personal history of other malignant neoplasm of bronchus and lung: Secondary | ICD-10-CM | POA: Diagnosis not present

## 2021-09-21 NOTE — Progress Notes (Signed)
[place]  [address] [place],  [place]  [address] [phone]  Clinic Day:  09/27/2021  Referring physician: Penelope Coop, FNP   This document serves as a record of services personally performed by Hosie Poisson, MD. It was created on their behalf by Curry,Lauren E, a trained medical scribe. The creation of this record is based on the scribe's personal observations and the provider's statements to them.  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 She was treated with concurrent radiation and chemotherapy, which included 6 cycles of carboplatin and Etoposide, that was completed in October 2014. She has restaging scans every 3 months that have remained stable.  A PET scan in March 2015 showed only mild to moderate metabolic activity in the area of her original tumor.  She does have degenerative disc disease and osteoarthritis with chronic bone pain.  We have agreed to prescribe pain medication for her as she is unable to obtain a primary care provider.  In addition, she has significant osteopenia and was placed on Fosamax along with calcium and vitamin-D.  She continues to complain of lower back pain, and rates it as an 8 and constant..  She states the Percocet 10/325 gives her relief but just for a short period of time.  In June of 2017, she was complaining of a catch under both breasts and some increased cough with occasional chest pain.  At that time her CT scan did show progressive collapse and consolidation of the left upper lobe and so a PET scan was done to evaluate this further.  This revealed mild hypermetabolism within the posterior left upper hemithorax consistent with radiation treatment  changes and appeared relatively uniform with no areas of intense uptake suspicious for malignancy.  In December of 2017, she had a rib fracture that was found on x-ray at urgent care. She was also put on MS Contin 51m bid. She was seen by Dr. RJackquline Denmarkand found to have a large gastric ulcer with bleeding.  She was placed on Protonix twice daily and had a follow-up upper endoscopy in July of 2018, which showed good healing of her ulcer, now consistent with an antral scar, and a small hiatal hernia. .  When I saw her in April, her hemoglobin was up to 11.3 but was back down to 9.1 on July 16th.  Biopsies revealed a reactive gastropathy and was negative for H pylori.  She is on iron supplement. She has had 2 myocardial infarctions, one in March of 2019 and another in June, [facility]  A chest x-ray done at that time showed just a chronic opacity at the left lung apex consistent with her treated lung cancer.  She also had an echocardiogram which revealed severe impairment of her left ventricular function with an ejection fraction of 30-35%.  She had akinesis of multiple areas of the myocardium.  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  It also revealed a small stable pericardial effusion.  ECHO from July 2021 revealed a good ejection fraction of 60-65% and small stable  pericardial effusion.  CT chest from November 2021 revealed stable dense radiation fibrosis involving the left paramediastinal lung and left hilum with no findings suspicious for recurrent tumor.  Stable emphysematous changes and pulmonary scarring and stable 3 mm subpleural right lower lobe pulmonary nodule, likely benign lymph node, was also seen.  Annual mammogram from December 2021 was negative.   INTERVAL HISTORY:  Ashley Miles is here for routine follow up to review  recent imaging results. CT chest from February 22nd reveals Stable dense radiation fibrosis involving the left upper lobe and left hilum. No findings suspicious for recurrent tumor. No mediastinal or hilar mass or adenopathy. Stable 3 mm subpleural pulmonary nodule in the right lower lobe. Stable advanced vascular calcifications. Stable small bilateral adrenal gland nodules, likely benign adenomas. She states that she quit smoking back in November 2022. She remains short of breath with mild exertion. I will refer her to a pulmonologist. Blood counts and chemistries with her primary care physician was unremarkable except for a creatinine of 1.5 with a BUN of 19 for an EGFR of 36. Her  appetite is good, and she has lost 3 pounds since her last visit.  She denies fever, chills or other signs of infection.  She denies nausea, vomiting, bowel issues, or abdominal pain.  She denies sore throat, cough, dyspnea, or chest pain.  REVIEW OF SYSTEMS:  Review of Systems  Constitutional: Negative.  Negative for appetite change, chills, fatigue, fever and unexpected weight change.  HENT:  Negative.    Eyes: Negative.   Respiratory:  Positive for shortness of breath (severe even with mild exertion). Negative for chest tightness, cough, hemoptysis and wheezing.   Cardiovascular: Negative.  Negative for chest pain, leg swelling and palpitations.  Gastrointestinal: Negative.  Negative for abdominal distention, abdominal pain, blood in stool, constipation, diarrhea, nausea and vomiting.  Endocrine: Negative.   Genitourinary: Negative.  Negative for difficulty urinating, dysuria, frequency and hematuria.   Musculoskeletal:  Positive for gait problem (uses a walker to ambulate). Negative for arthralgias, back pain, flank pain and myalgias.  Skin: Negative.   Neurological:  Positive for gait problem (uses a walker to ambulate). Negative for dizziness, extremity weakness, headaches, light-headedness, numbness, seizures and  speech difficulty.  Hematological: Negative.   Psychiatric/Behavioral: Negative.  Negative for depression and sleep disturbance. The patient is not nervous/anxious.     VITALS:  Blood pressure (!) 163/72, pulse 86, temperature 98.2 F (36.8 C), temperature source Oral, resp. rate (!) 22, height '5\' 6"'  (1.676 m), weight 170 lb 3.2 oz (77.2 kg), SpO2 94 %.  Wt Readings from Last 3 Encounters:  09/27/21 170 lb 3.2 oz (77.2 kg)  08/10/21 169 lb 6.4 oz (76.8 kg)  03/15/21 173 lb 6.4 oz (78.7 kg)    Body mass index is 27.47 kg/m.  Performance status (ECOG): 1 - Symptomatic but completely ambulatory  PHYSICAL EXAM:  Physical Exam Constitutional:      General: She is not in acute distress.    Appearance: Normal appearance. She is normal weight.  HENT:     Head: Normocephalic and atraumatic.  Eyes:     General: No scleral icterus.    Extraocular Movements: Extraocular movements intact.     Conjunctiva/sclera: Conjunctivae normal.     Pupils: Pupils are equal, round, and reactive to light.  Cardiovascular:     Rate and Rhythm: Normal rate and regular rhythm.     Pulses: Normal pulses.     Heart sounds: Normal heart sounds. No murmur heard.  No friction rub. No gallop.  Pulmonary:     Effort: Pulmonary effort is normal. No respiratory distress.     Breath sounds: Normal breath sounds.  Abdominal:     General: Bowel sounds are normal. There is no distension.     Palpations: Abdomen is soft. There is no hepatomegaly, splenomegaly or mass.     Tenderness: There is no abdominal tenderness.  Musculoskeletal:        General: Normal range of motion.     Cervical back: Normal range of motion and neck supple.     Right lower leg: No edema.     Left lower leg: No edema.  Lymphadenopathy:     Cervical: No cervical adenopathy.  Skin:    General: Skin is warm and dry.  Neurological:     General: No focal deficit present.     Mental Status: She is alert and oriented to person, place, and  time. Mental status is at baseline.  Psychiatric:        Mood and Affect: Mood normal.        Behavior: Behavior normal.        Thought Content: Thought content normal.        Judgment: Judgment normal.    LABS:   CBC Latest Ref Rng & Units 09/24/2021 03/15/2021 08/06/2020  WBC - 9.6 8.7 10.4  Hemoglobin 12.0 - 16.0 12.2 12.8 13.9  Hematocrit 36 - 46 36 39 40.6  Platelets 150 - 399 285 310 309   CMP Latest Ref Rng & Units 09/24/2021 03/15/2021 08/06/2020  Glucose 70 - 99 mg/dL - - 165(H)  BUN 4 - '21 19 13 13  ' Creatinine 0.5 - 1.1 1.5(A) 1.3(A) 1.42(H)  Sodium 137 - 147 140 138 141  Potassium 3.4 - 5.3 3.8 4.3 3.9  Chloride 99 - 108 104 109(A) 105  CO2 13 - '22 19 18 22  ' Calcium 8.7 - 10.7 9.3 9.3 10.0  Total Protein 6.5 - 8.1 g/dL - - 7.6  Total Bilirubin 0.3 - 1.2 mg/dL - - 0.6  Alkaline Phos 25 - 125 64 57 50  AST 13 - 35 '17 23 22  ' ALT 7 - 35 '9 11 11     ' Lab Results  Component Value Date   CEA1 2.5 03/15/2021   /  CEA  Date Value Ref Range Status  03/15/2021 2.5 0.0 - 4.7 ng/mL Final    Comment:    (NOTE)                             Nonsmokers          <3.9                             Smokers             <5.6 [employer] Electrochemiluminescence Immunoassay (ECLIA) Values obtained with different assay methods or kits cannot be used interchangeably.  Results cannot be interpreted as absolute evidence of the presence or absence of malignant disease. Performed At: [facility] [place], [place] [id number] Rush Farmer MD OM:[id number]      STUDIES:    EXAM: 09/26/2021 CT CHEST WITH CONTRAST   TECHNIQUE:  Multidetector CT imaging of the chest was performed during  intravenous contrast administration.   RADIATION DOSE REDUCTION: This exam was performed according to the  departmental dose-optimization program which includes  automated  exposure control, adjustment of the mA and/or kV according to  patient size and/or use of  iterative reconstruction technique.   CONTRAST: 80 cc Isovue 370   COMPARISON: CT scan 06/26/2020   FINDINGS:  Cardiovascular: The heart is normal in size. Small residual  pericardial effusion. The aorta demonstrates stable tortuosity and  calcification but no focal aneurysm or dissection. Stable  three-vessel coronary artery calcifications.  Mediastinum/Nodes: No mediastinal or hilar mass or lymphadenopathy.  Small scattered lymph nodes are unchanged. The esophagus is grossly  normal.  Lungs/Pleura: Stable dense radiation fibrosis involving the left  upper lobe and left hilum. No findings suspicious for recurrent  tumor. No findings suspicious for pulmonary metastatic disease.  Stable subpleural 3 mm pulmonary nodule in the right lower lobe on  image number [id number]. No pleural effusions or pleural nodules. Stable  slightly prominent pleural fat involving the right upper hemithorax.  Upper Abdomen: Stable small bilateral adrenal gland nodules, likely  benign adenomas. No worrisome hepatic lesions or upper abdominal  adenopathy. Stable advanced vascular calcifications.  Musculoskeletal: No breast masses, supraclavicular or axillary  adenopathy. Thyroid gland is unremarkable. The bony thorax is  intact.   IMPRESSION:  1. Stable dense radiation fibrosis involving the left upper lobe and  left hilum. No findings suspicious for recurrent tumor.  2. No mediastinal or hilar mass or adenopathy.  3. Stable 3 mm subpleural pulmonary nodule in the right lower lobe.  4. Stable advanced vascular calcifications.  5. Stable small bilateral adrenal gland nodules, likely benign  adenomas.   Allergies: No Known Allergies  Current Medications: Current Outpatient Medications  Medication Sig Dispense Refill   albuterol (VENTOLIN HFA) 108 (90 Base) MCG/ACT inhaler Inhale 2 puffs into the lungs as needed for shortness of breath.     buPROPion (WELLBUTRIN SR) 150 MG 12 hr tablet Take 150 mg by mouth  2 (two) times daily.     busPIRone (BUSPAR) 15 MG tablet Take 15 mg by mouth daily.     clopidogrel (PLAVIX) 75 MG tablet Take 1 tablet (75 mg total) by mouth daily. 30 tablet 6   dicyclomine (BENTYL) 10 MG capsule Take 10 mg by mouth every 6 (six) hours.     doxepin (SINEQUAN) 10 MG capsule Take 10 mg by mouth daily.     doxylamine, Sleep, (UNISOM) 25 MG tablet Take 25 mg by mouth at bedtime as needed for sleep.     ezetimibe (ZETIA) 10 MG tablet Take 1 tablet (10 mg total) by mouth daily. 30 tablet 6   Ferrous Sulfate (FEROSUL PO) Take 325 mg by mouth daily.     furosemide (LASIX) 20 MG tablet Take 10 mg by mouth daily.     ipratropium-albuterol (DUONEB) 0.5-2.5 (3) MG/3ML SOLN Take 3 mLs by nebulization every 6 (six) hours as needed.     lubiprostone (AMITIZA) 24 MCG capsule Take 24 mcg by mouth 2 (two) times daily with a meal.     metoprolol succinate (TOPROL-XL) 25 MG 24 hr tablet TAKE 1 TABLET BY MOUTH ONCE DAILY. 30 tablet 0   metoprolol succinate (TOPROL-XL) 25 MG 24 hr tablet Take 25 mg by mouth daily.     morphine (MS CONTIN) 15 MG 12 hr tablet Take 1 tablet (15 mg total) by mouth every 12 (twelve) hours. 60 tablet 0   naloxegol oxalate (MOVANTIK) 25 MG TABS tablet Take 25 mg by mouth daily.     nitroGLYCERIN (NITROSTAT) 0.4 MG SL tablet Place 1 tablet (0.4 mg  total) under the tongue every 5 (five) minutes x 3 doses as needed for chest pain. 25 tablet 12   ondansetron (ZOFRAN ODT) 4 MG disintegrating tablet 17m ODT q4 hours prn nausea/vomit 8 tablet 0   oxyCODONE-acetaminophen (PERCOCET) 10-325 MG tablet Take 1 tablet by mouth every 6 (six) hours as needed. for pain 120 tablet 0   pantoprazole (PROTONIX) 40 MG tablet Take 40 mg by mouth 2 (two) times daily.     polyethylene glycol (MIRALAX / GLYCOLAX) packet Take 17 g by mouth daily.     potassium chloride (K-DUR) 10 MEQ tablet Take 10 mEq by mouth daily.     prochlorperazine (COMPAZINE) 10 MG tablet Take 10 mg by mouth 3 (three) times  daily as needed for nausea/vomiting, nausea or vomiting.     rosuvastatin (CRESTOR) 10 MG tablet Take 1 tablet by mouth daily.     tiZANidine (ZANAFLEX) 4 MG tablet Take 4 mg by mouth at bedtime as needed for muscle spasms.     Vitamin D, Ergocalciferol, (DRISDOL) 50000 units CAPS capsule Take 50,000 Units by mouth every 7 (seven) days.     No current facility-administered medications for this visit.     ASSESSMENT & PLAN:   Assessment:   1.  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  This was treated with chemotherapy and radiation.  She remains without evidence of disease.    2.  Tiny stable pulmonary nodules.  3.  Chronic back pain from severe degenerative disc disease and osteoarthritis.  She continues oxycodone 10/325 with fairly good control of her pain.  4.  Stable small to moderate pericardial effusion.  ECHO from July 2021 revealed an EF between 60-65%.  5.  COPD with severe dyspnea. She finally quit smoking in November 2022. She may require oxygen. We will refer her to Dr. CAlcide Cleverfor further management of her dyspnea.    Plan: CT chest remains without evidence of recurrence. Her port was flushed with her imaging yesterday. We will plan to refer her to Dr. CAlcide Clever pulmonologist, due to her severe COPD and will fax a copy of her recent imaging. I also gave her a copy of her scans to take with her to her appointment with her primary care physician.  Otherwise, we will see her back in 6 months with CBC, comprehensive metabolic profile and CEA for examination and port flush.  She understands and agrees with this plan of care.   I provided 25 minutes of face-to-face time during this this encounter and > 50% was spent counseling as documented under my assessment and plan.    CDerwood Kaplan MD [facility]AT [facility]Dept: [phone] Fax:  [phone]  I, LRita Ohara am acting as scribe for CDerwood Kaplan MD  I have reviewed this report as typed by the medical scribe, and it is complete and accurate.

## 2021-09-24 DIAGNOSIS — R8290 Unspecified abnormal findings in urine: Secondary | ICD-10-CM | POA: Diagnosis not present

## 2021-09-24 DIAGNOSIS — N390 Urinary tract infection, site not specified: Secondary | ICD-10-CM | POA: Diagnosis not present

## 2021-09-24 DIAGNOSIS — G4700 Insomnia, unspecified: Secondary | ICD-10-CM | POA: Diagnosis not present

## 2021-09-24 DIAGNOSIS — E785 Hyperlipidemia, unspecified: Secondary | ICD-10-CM | POA: Diagnosis not present

## 2021-09-24 DIAGNOSIS — C3490 Malignant neoplasm of unspecified part of unspecified bronchus or lung: Secondary | ICD-10-CM | POA: Diagnosis not present

## 2021-09-24 DIAGNOSIS — R7989 Other specified abnormal findings of blood chemistry: Secondary | ICD-10-CM | POA: Diagnosis not present

## 2021-09-24 DIAGNOSIS — R600 Localized edema: Secondary | ICD-10-CM | POA: Diagnosis not present

## 2021-09-24 DIAGNOSIS — D509 Iron deficiency anemia, unspecified: Secondary | ICD-10-CM | POA: Diagnosis not present

## 2021-09-24 DIAGNOSIS — E559 Vitamin D deficiency, unspecified: Secondary | ICD-10-CM | POA: Diagnosis not present

## 2021-09-24 DIAGNOSIS — E538 Deficiency of other specified B group vitamins: Secondary | ICD-10-CM | POA: Diagnosis not present

## 2021-09-24 LAB — LIPID PANEL
Cholesterol: 153 (ref 0–200)
HDL: 50 (ref 35–70)
LDL Cholesterol: 69
LDl/HDL Ratio: 1.4
Triglycerides: 209 — AB (ref 40–160)

## 2021-09-24 LAB — HEPATIC FUNCTION PANEL
ALT: 9 (ref 7–35)
AST: 17 (ref 13–35)
Alkaline Phosphatase: 64 (ref 25–125)
Bilirubin, Total: 0.2

## 2021-09-24 LAB — CBC AND DIFFERENTIAL
HCT: 36 (ref 36–46)
Hemoglobin: 12.2 (ref 12.0–16.0)
Neutrophils Absolute: 6.5
Platelets: 285 (ref 150–399)
WBC: 9.6

## 2021-09-24 LAB — VITAMIN B12: Vitamin B-12: 600

## 2021-09-24 LAB — IRON,TIBC AND FERRITIN PANEL
%SAT: 11
Ferritin: 324
Iron: 32
TIBC: 296
UIBC: 264

## 2021-09-24 LAB — COMPREHENSIVE METABOLIC PANEL
Albumin: 4.5 (ref 3.5–5.0)
Calcium: 9.3 (ref 8.7–10.7)

## 2021-09-24 LAB — BASIC METABOLIC PANEL
BUN: 19 (ref 4–21)
CO2: 19 (ref 13–22)
Chloride: 104 (ref 99–108)
Creatinine: 1.5 — AB (ref 0.5–1.1)
Glucose: 135
Potassium: 3.8 (ref 3.4–5.3)
Sodium: 140 (ref 137–147)

## 2021-09-24 LAB — CBC: RBC: 3.96 (ref 3.87–5.11)

## 2021-09-24 LAB — VITAMIN D 25 HYDROXY (VIT D DEFICIENCY, FRACTURES): Vit D, 25-Hydroxy: 35

## 2021-09-25 DIAGNOSIS — I5022 Chronic systolic (congestive) heart failure: Secondary | ICD-10-CM | POA: Diagnosis not present

## 2021-09-25 DIAGNOSIS — I2510 Atherosclerotic heart disease of native coronary artery without angina pectoris: Secondary | ICD-10-CM | POA: Diagnosis not present

## 2021-09-25 DIAGNOSIS — I252 Old myocardial infarction: Secondary | ICD-10-CM | POA: Diagnosis not present

## 2021-09-25 DIAGNOSIS — E7800 Pure hypercholesterolemia, unspecified: Secondary | ICD-10-CM | POA: Diagnosis not present

## 2021-09-25 DIAGNOSIS — K219 Gastro-esophageal reflux disease without esophagitis: Secondary | ICD-10-CM | POA: Diagnosis not present

## 2021-09-25 DIAGNOSIS — I110 Hypertensive heart disease with heart failure: Secondary | ICD-10-CM | POA: Diagnosis not present

## 2021-09-25 DIAGNOSIS — G8929 Other chronic pain: Secondary | ICD-10-CM | POA: Diagnosis not present

## 2021-09-25 DIAGNOSIS — J441 Chronic obstructive pulmonary disease with (acute) exacerbation: Secondary | ICD-10-CM | POA: Diagnosis not present

## 2021-09-25 DIAGNOSIS — Z7902 Long term (current) use of antithrombotics/antiplatelets: Secondary | ICD-10-CM | POA: Diagnosis not present

## 2021-09-25 DIAGNOSIS — Z85118 Personal history of other malignant neoplasm of bronchus and lung: Secondary | ICD-10-CM | POA: Diagnosis not present

## 2021-09-25 DIAGNOSIS — M1990 Unspecified osteoarthritis, unspecified site: Secondary | ICD-10-CM | POA: Diagnosis not present

## 2021-09-25 DIAGNOSIS — M5030 Other cervical disc degeneration, unspecified cervical region: Secondary | ICD-10-CM | POA: Diagnosis not present

## 2021-09-26 ENCOUNTER — Other Ambulatory Visit: Payer: Self-pay

## 2021-09-26 DIAGNOSIS — R911 Solitary pulmonary nodule: Secondary | ICD-10-CM | POA: Diagnosis not present

## 2021-09-26 DIAGNOSIS — J439 Emphysema, unspecified: Secondary | ICD-10-CM | POA: Diagnosis not present

## 2021-09-26 DIAGNOSIS — I3139 Other pericardial effusion (noninflammatory): Secondary | ICD-10-CM | POA: Diagnosis not present

## 2021-09-26 DIAGNOSIS — I700 Atherosclerosis of aorta: Secondary | ICD-10-CM | POA: Diagnosis not present

## 2021-09-26 DIAGNOSIS — C3490 Malignant neoplasm of unspecified part of unspecified bronchus or lung: Secondary | ICD-10-CM | POA: Diagnosis not present

## 2021-09-27 ENCOUNTER — Other Ambulatory Visit: Payer: Self-pay | Admitting: Oncology

## 2021-09-27 ENCOUNTER — Encounter: Payer: Self-pay | Admitting: Oncology

## 2021-09-27 ENCOUNTER — Ambulatory Visit: Payer: Medicare Other

## 2021-09-27 ENCOUNTER — Inpatient Hospital Stay: Payer: Medicare Other | Attending: Oncology | Admitting: Oncology

## 2021-09-27 ENCOUNTER — Other Ambulatory Visit: Payer: Self-pay

## 2021-09-27 VITALS — BP 163/72 | HR 86 | Temp 98.2°F | Resp 22 | Ht 66.0 in | Wt 170.2 lb

## 2021-09-27 DIAGNOSIS — K219 Gastro-esophageal reflux disease without esophagitis: Secondary | ICD-10-CM | POA: Diagnosis not present

## 2021-09-27 DIAGNOSIS — C3490 Malignant neoplasm of unspecified part of unspecified bronchus or lung: Secondary | ICD-10-CM | POA: Diagnosis not present

## 2021-09-27 DIAGNOSIS — G8929 Other chronic pain: Secondary | ICD-10-CM | POA: Diagnosis not present

## 2021-09-27 DIAGNOSIS — I2510 Atherosclerotic heart disease of native coronary artery without angina pectoris: Secondary | ICD-10-CM | POA: Diagnosis not present

## 2021-09-27 DIAGNOSIS — Z7902 Long term (current) use of antithrombotics/antiplatelets: Secondary | ICD-10-CM | POA: Diagnosis not present

## 2021-09-27 DIAGNOSIS — I5022 Chronic systolic (congestive) heart failure: Secondary | ICD-10-CM | POA: Diagnosis not present

## 2021-09-27 DIAGNOSIS — C3412 Malignant neoplasm of upper lobe, left bronchus or lung: Secondary | ICD-10-CM

## 2021-09-27 DIAGNOSIS — E7800 Pure hypercholesterolemia, unspecified: Secondary | ICD-10-CM | POA: Diagnosis not present

## 2021-09-27 DIAGNOSIS — I252 Old myocardial infarction: Secondary | ICD-10-CM | POA: Diagnosis not present

## 2021-09-27 DIAGNOSIS — Z85118 Personal history of other malignant neoplasm of bronchus and lung: Secondary | ICD-10-CM | POA: Diagnosis not present

## 2021-09-27 DIAGNOSIS — I110 Hypertensive heart disease with heart failure: Secondary | ICD-10-CM | POA: Diagnosis not present

## 2021-09-27 DIAGNOSIS — M1990 Unspecified osteoarthritis, unspecified site: Secondary | ICD-10-CM | POA: Diagnosis not present

## 2021-09-27 DIAGNOSIS — M5030 Other cervical disc degeneration, unspecified cervical region: Secondary | ICD-10-CM | POA: Diagnosis not present

## 2021-09-27 DIAGNOSIS — J441 Chronic obstructive pulmonary disease with (acute) exacerbation: Secondary | ICD-10-CM | POA: Diagnosis not present

## 2021-09-28 ENCOUNTER — Telehealth: Payer: Self-pay

## 2021-09-28 DIAGNOSIS — Z7902 Long term (current) use of antithrombotics/antiplatelets: Secondary | ICD-10-CM | POA: Diagnosis not present

## 2021-09-28 DIAGNOSIS — I5022 Chronic systolic (congestive) heart failure: Secondary | ICD-10-CM | POA: Diagnosis not present

## 2021-09-28 DIAGNOSIS — Z85118 Personal history of other malignant neoplasm of bronchus and lung: Secondary | ICD-10-CM | POA: Diagnosis not present

## 2021-09-28 DIAGNOSIS — J441 Chronic obstructive pulmonary disease with (acute) exacerbation: Secondary | ICD-10-CM | POA: Diagnosis not present

## 2021-09-28 DIAGNOSIS — I110 Hypertensive heart disease with heart failure: Secondary | ICD-10-CM | POA: Diagnosis not present

## 2021-09-28 DIAGNOSIS — M1990 Unspecified osteoarthritis, unspecified site: Secondary | ICD-10-CM | POA: Diagnosis not present

## 2021-09-28 DIAGNOSIS — M5030 Other cervical disc degeneration, unspecified cervical region: Secondary | ICD-10-CM | POA: Diagnosis not present

## 2021-09-28 DIAGNOSIS — E7800 Pure hypercholesterolemia, unspecified: Secondary | ICD-10-CM | POA: Diagnosis not present

## 2021-09-28 DIAGNOSIS — I252 Old myocardial infarction: Secondary | ICD-10-CM | POA: Diagnosis not present

## 2021-09-28 DIAGNOSIS — G8929 Other chronic pain: Secondary | ICD-10-CM | POA: Diagnosis not present

## 2021-09-28 DIAGNOSIS — I2510 Atherosclerotic heart disease of native coronary artery without angina pectoris: Secondary | ICD-10-CM | POA: Diagnosis not present

## 2021-09-28 DIAGNOSIS — K219 Gastro-esophageal reflux disease without esophagitis: Secondary | ICD-10-CM | POA: Diagnosis not present

## 2021-09-28 NOTE — Telephone Encounter (Signed)
Faxed referral to Dr. Maxie Barb office

## 2021-09-28 NOTE — Telephone Encounter (Signed)
-----   Message from Derwood Kaplan, MD sent at 09/27/2021  6:15 PM EST ----- Regarding: refer Refer Dr. Alcide Clever for COPD and severe dyspnea in pt with small cell lung cancer 9 years ago

## 2021-10-01 ENCOUNTER — Encounter: Payer: Self-pay | Admitting: Oncology

## 2021-10-01 DIAGNOSIS — E7800 Pure hypercholesterolemia, unspecified: Secondary | ICD-10-CM | POA: Diagnosis not present

## 2021-10-01 DIAGNOSIS — Z7902 Long term (current) use of antithrombotics/antiplatelets: Secondary | ICD-10-CM | POA: Diagnosis not present

## 2021-10-01 DIAGNOSIS — N289 Disorder of kidney and ureter, unspecified: Secondary | ICD-10-CM | POA: Diagnosis not present

## 2021-10-01 DIAGNOSIS — Z85118 Personal history of other malignant neoplasm of bronchus and lung: Secondary | ICD-10-CM | POA: Diagnosis not present

## 2021-10-01 DIAGNOSIS — M1990 Unspecified osteoarthritis, unspecified site: Secondary | ICD-10-CM | POA: Diagnosis not present

## 2021-10-01 DIAGNOSIS — I252 Old myocardial infarction: Secondary | ICD-10-CM | POA: Diagnosis not present

## 2021-10-01 DIAGNOSIS — G8929 Other chronic pain: Secondary | ICD-10-CM | POA: Diagnosis not present

## 2021-10-01 DIAGNOSIS — K219 Gastro-esophageal reflux disease without esophagitis: Secondary | ICD-10-CM | POA: Diagnosis not present

## 2021-10-01 DIAGNOSIS — J441 Chronic obstructive pulmonary disease with (acute) exacerbation: Secondary | ICD-10-CM | POA: Diagnosis not present

## 2021-10-01 DIAGNOSIS — I2510 Atherosclerotic heart disease of native coronary artery without angina pectoris: Secondary | ICD-10-CM | POA: Diagnosis not present

## 2021-10-01 DIAGNOSIS — M5030 Other cervical disc degeneration, unspecified cervical region: Secondary | ICD-10-CM | POA: Diagnosis not present

## 2021-10-01 DIAGNOSIS — I5022 Chronic systolic (congestive) heart failure: Secondary | ICD-10-CM | POA: Diagnosis not present

## 2021-10-01 DIAGNOSIS — I110 Hypertensive heart disease with heart failure: Secondary | ICD-10-CM | POA: Diagnosis not present

## 2021-10-03 DIAGNOSIS — G8929 Other chronic pain: Secondary | ICD-10-CM | POA: Diagnosis not present

## 2021-10-03 DIAGNOSIS — Z7902 Long term (current) use of antithrombotics/antiplatelets: Secondary | ICD-10-CM | POA: Diagnosis not present

## 2021-10-03 DIAGNOSIS — J441 Chronic obstructive pulmonary disease with (acute) exacerbation: Secondary | ICD-10-CM | POA: Diagnosis not present

## 2021-10-03 DIAGNOSIS — I110 Hypertensive heart disease with heart failure: Secondary | ICD-10-CM | POA: Diagnosis not present

## 2021-10-03 DIAGNOSIS — I2510 Atherosclerotic heart disease of native coronary artery without angina pectoris: Secondary | ICD-10-CM | POA: Diagnosis not present

## 2021-10-03 DIAGNOSIS — K219 Gastro-esophageal reflux disease without esophagitis: Secondary | ICD-10-CM | POA: Diagnosis not present

## 2021-10-03 DIAGNOSIS — I252 Old myocardial infarction: Secondary | ICD-10-CM | POA: Diagnosis not present

## 2021-10-03 DIAGNOSIS — E7800 Pure hypercholesterolemia, unspecified: Secondary | ICD-10-CM | POA: Diagnosis not present

## 2021-10-03 DIAGNOSIS — I5022 Chronic systolic (congestive) heart failure: Secondary | ICD-10-CM | POA: Diagnosis not present

## 2021-10-03 DIAGNOSIS — M1990 Unspecified osteoarthritis, unspecified site: Secondary | ICD-10-CM | POA: Diagnosis not present

## 2021-10-03 DIAGNOSIS — M5030 Other cervical disc degeneration, unspecified cervical region: Secondary | ICD-10-CM | POA: Diagnosis not present

## 2021-10-03 DIAGNOSIS — Z85118 Personal history of other malignant neoplasm of bronchus and lung: Secondary | ICD-10-CM | POA: Diagnosis not present

## 2021-10-04 DIAGNOSIS — M5030 Other cervical disc degeneration, unspecified cervical region: Secondary | ICD-10-CM | POA: Diagnosis not present

## 2021-10-04 DIAGNOSIS — Z7902 Long term (current) use of antithrombotics/antiplatelets: Secondary | ICD-10-CM | POA: Diagnosis not present

## 2021-10-04 DIAGNOSIS — E7800 Pure hypercholesterolemia, unspecified: Secondary | ICD-10-CM | POA: Diagnosis not present

## 2021-10-04 DIAGNOSIS — I2510 Atherosclerotic heart disease of native coronary artery without angina pectoris: Secondary | ICD-10-CM | POA: Diagnosis not present

## 2021-10-04 DIAGNOSIS — K219 Gastro-esophageal reflux disease without esophagitis: Secondary | ICD-10-CM | POA: Diagnosis not present

## 2021-10-04 DIAGNOSIS — M1990 Unspecified osteoarthritis, unspecified site: Secondary | ICD-10-CM | POA: Diagnosis not present

## 2021-10-04 DIAGNOSIS — Z85118 Personal history of other malignant neoplasm of bronchus and lung: Secondary | ICD-10-CM | POA: Diagnosis not present

## 2021-10-04 DIAGNOSIS — J441 Chronic obstructive pulmonary disease with (acute) exacerbation: Secondary | ICD-10-CM | POA: Diagnosis not present

## 2021-10-04 DIAGNOSIS — I252 Old myocardial infarction: Secondary | ICD-10-CM | POA: Diagnosis not present

## 2021-10-04 DIAGNOSIS — I110 Hypertensive heart disease with heart failure: Secondary | ICD-10-CM | POA: Diagnosis not present

## 2021-10-04 DIAGNOSIS — I5022 Chronic systolic (congestive) heart failure: Secondary | ICD-10-CM | POA: Diagnosis not present

## 2021-10-04 DIAGNOSIS — G8929 Other chronic pain: Secondary | ICD-10-CM | POA: Diagnosis not present

## 2021-10-05 ENCOUNTER — Encounter: Payer: Self-pay | Admitting: Hematology and Oncology

## 2021-10-05 DIAGNOSIS — J439 Emphysema, unspecified: Secondary | ICD-10-CM | POA: Diagnosis not present

## 2021-10-09 ENCOUNTER — Other Ambulatory Visit: Payer: Self-pay

## 2021-10-09 DIAGNOSIS — Z8739 Personal history of other diseases of the musculoskeletal system and connective tissue: Secondary | ICD-10-CM

## 2021-10-09 DIAGNOSIS — M546 Pain in thoracic spine: Secondary | ICD-10-CM

## 2021-10-09 DIAGNOSIS — M479 Spondylosis, unspecified: Secondary | ICD-10-CM

## 2021-10-09 DIAGNOSIS — G8929 Other chronic pain: Secondary | ICD-10-CM

## 2021-10-09 MED ORDER — MORPHINE SULFATE ER 15 MG PO TBCR: 15.0000 mg | EXTENDED_RELEASE_TABLET | Freq: Two times a day (BID) | ORAL | 0 refills | Status: DC

## 2021-10-10 DIAGNOSIS — Z85118 Personal history of other malignant neoplasm of bronchus and lung: Secondary | ICD-10-CM | POA: Diagnosis not present

## 2021-10-10 DIAGNOSIS — E7800 Pure hypercholesterolemia, unspecified: Secondary | ICD-10-CM | POA: Diagnosis not present

## 2021-10-10 DIAGNOSIS — I5022 Chronic systolic (congestive) heart failure: Secondary | ICD-10-CM | POA: Diagnosis not present

## 2021-10-10 DIAGNOSIS — I2510 Atherosclerotic heart disease of native coronary artery without angina pectoris: Secondary | ICD-10-CM | POA: Diagnosis not present

## 2021-10-10 DIAGNOSIS — M1990 Unspecified osteoarthritis, unspecified site: Secondary | ICD-10-CM | POA: Diagnosis not present

## 2021-10-10 DIAGNOSIS — G8929 Other chronic pain: Secondary | ICD-10-CM | POA: Diagnosis not present

## 2021-10-10 DIAGNOSIS — J441 Chronic obstructive pulmonary disease with (acute) exacerbation: Secondary | ICD-10-CM | POA: Diagnosis not present

## 2021-10-10 DIAGNOSIS — I252 Old myocardial infarction: Secondary | ICD-10-CM | POA: Diagnosis not present

## 2021-10-10 DIAGNOSIS — K219 Gastro-esophageal reflux disease without esophagitis: Secondary | ICD-10-CM | POA: Diagnosis not present

## 2021-10-10 DIAGNOSIS — Z7902 Long term (current) use of antithrombotics/antiplatelets: Secondary | ICD-10-CM | POA: Diagnosis not present

## 2021-10-10 DIAGNOSIS — I110 Hypertensive heart disease with heart failure: Secondary | ICD-10-CM | POA: Diagnosis not present

## 2021-10-10 DIAGNOSIS — M5030 Other cervical disc degeneration, unspecified cervical region: Secondary | ICD-10-CM | POA: Diagnosis not present

## 2021-10-11 DIAGNOSIS — J449 Chronic obstructive pulmonary disease, unspecified: Secondary | ICD-10-CM | POA: Diagnosis not present

## 2021-10-11 DIAGNOSIS — G4733 Obstructive sleep apnea (adult) (pediatric): Secondary | ICD-10-CM | POA: Diagnosis not present

## 2021-10-11 DIAGNOSIS — Z87891 Personal history of nicotine dependence: Secondary | ICD-10-CM | POA: Diagnosis not present

## 2021-10-11 DIAGNOSIS — J700 Acute pulmonary manifestations due to radiation: Secondary | ICD-10-CM | POA: Diagnosis not present

## 2021-10-12 DIAGNOSIS — Z85118 Personal history of other malignant neoplasm of bronchus and lung: Secondary | ICD-10-CM | POA: Diagnosis not present

## 2021-10-12 DIAGNOSIS — M1990 Unspecified osteoarthritis, unspecified site: Secondary | ICD-10-CM | POA: Diagnosis not present

## 2021-10-12 DIAGNOSIS — E7800 Pure hypercholesterolemia, unspecified: Secondary | ICD-10-CM | POA: Diagnosis not present

## 2021-10-12 DIAGNOSIS — I252 Old myocardial infarction: Secondary | ICD-10-CM | POA: Diagnosis not present

## 2021-10-12 DIAGNOSIS — G8929 Other chronic pain: Secondary | ICD-10-CM | POA: Diagnosis not present

## 2021-10-12 DIAGNOSIS — J441 Chronic obstructive pulmonary disease with (acute) exacerbation: Secondary | ICD-10-CM | POA: Diagnosis not present

## 2021-10-12 DIAGNOSIS — I110 Hypertensive heart disease with heart failure: Secondary | ICD-10-CM | POA: Diagnosis not present

## 2021-10-12 DIAGNOSIS — M5030 Other cervical disc degeneration, unspecified cervical region: Secondary | ICD-10-CM | POA: Diagnosis not present

## 2021-10-12 DIAGNOSIS — K219 Gastro-esophageal reflux disease without esophagitis: Secondary | ICD-10-CM | POA: Diagnosis not present

## 2021-10-12 DIAGNOSIS — I2510 Atherosclerotic heart disease of native coronary artery without angina pectoris: Secondary | ICD-10-CM | POA: Diagnosis not present

## 2021-10-12 DIAGNOSIS — Z7902 Long term (current) use of antithrombotics/antiplatelets: Secondary | ICD-10-CM | POA: Diagnosis not present

## 2021-10-12 DIAGNOSIS — I5022 Chronic systolic (congestive) heart failure: Secondary | ICD-10-CM | POA: Diagnosis not present

## 2021-10-16 DIAGNOSIS — E7800 Pure hypercholesterolemia, unspecified: Secondary | ICD-10-CM | POA: Diagnosis not present

## 2021-10-16 DIAGNOSIS — I110 Hypertensive heart disease with heart failure: Secondary | ICD-10-CM | POA: Diagnosis not present

## 2021-10-16 DIAGNOSIS — J441 Chronic obstructive pulmonary disease with (acute) exacerbation: Secondary | ICD-10-CM | POA: Diagnosis not present

## 2021-10-16 DIAGNOSIS — I252 Old myocardial infarction: Secondary | ICD-10-CM | POA: Diagnosis not present

## 2021-10-16 DIAGNOSIS — I5022 Chronic systolic (congestive) heart failure: Secondary | ICD-10-CM | POA: Diagnosis not present

## 2021-10-16 DIAGNOSIS — G8929 Other chronic pain: Secondary | ICD-10-CM | POA: Diagnosis not present

## 2021-10-16 DIAGNOSIS — I2510 Atherosclerotic heart disease of native coronary artery without angina pectoris: Secondary | ICD-10-CM | POA: Diagnosis not present

## 2021-10-16 DIAGNOSIS — M1990 Unspecified osteoarthritis, unspecified site: Secondary | ICD-10-CM | POA: Diagnosis not present

## 2021-10-16 DIAGNOSIS — Z85118 Personal history of other malignant neoplasm of bronchus and lung: Secondary | ICD-10-CM | POA: Diagnosis not present

## 2021-10-16 DIAGNOSIS — Z7902 Long term (current) use of antithrombotics/antiplatelets: Secondary | ICD-10-CM | POA: Diagnosis not present

## 2021-10-16 DIAGNOSIS — M5030 Other cervical disc degeneration, unspecified cervical region: Secondary | ICD-10-CM | POA: Diagnosis not present

## 2021-10-16 DIAGNOSIS — K219 Gastro-esophageal reflux disease without esophagitis: Secondary | ICD-10-CM | POA: Diagnosis not present

## 2021-10-17 DIAGNOSIS — Z7902 Long term (current) use of antithrombotics/antiplatelets: Secondary | ICD-10-CM | POA: Diagnosis not present

## 2021-10-17 DIAGNOSIS — Z85118 Personal history of other malignant neoplasm of bronchus and lung: Secondary | ICD-10-CM | POA: Diagnosis not present

## 2021-10-17 DIAGNOSIS — E7800 Pure hypercholesterolemia, unspecified: Secondary | ICD-10-CM | POA: Diagnosis not present

## 2021-10-17 DIAGNOSIS — J441 Chronic obstructive pulmonary disease with (acute) exacerbation: Secondary | ICD-10-CM | POA: Diagnosis not present

## 2021-10-17 DIAGNOSIS — K219 Gastro-esophageal reflux disease without esophagitis: Secondary | ICD-10-CM | POA: Diagnosis not present

## 2021-10-17 DIAGNOSIS — I110 Hypertensive heart disease with heart failure: Secondary | ICD-10-CM | POA: Diagnosis not present

## 2021-10-17 DIAGNOSIS — G8929 Other chronic pain: Secondary | ICD-10-CM | POA: Diagnosis not present

## 2021-10-17 DIAGNOSIS — I5022 Chronic systolic (congestive) heart failure: Secondary | ICD-10-CM | POA: Diagnosis not present

## 2021-10-17 DIAGNOSIS — I2510 Atherosclerotic heart disease of native coronary artery without angina pectoris: Secondary | ICD-10-CM | POA: Diagnosis not present

## 2021-10-17 DIAGNOSIS — M5030 Other cervical disc degeneration, unspecified cervical region: Secondary | ICD-10-CM | POA: Diagnosis not present

## 2021-10-17 DIAGNOSIS — I252 Old myocardial infarction: Secondary | ICD-10-CM | POA: Diagnosis not present

## 2021-10-17 DIAGNOSIS — M1990 Unspecified osteoarthritis, unspecified site: Secondary | ICD-10-CM | POA: Diagnosis not present

## 2021-10-23 DIAGNOSIS — Z7902 Long term (current) use of antithrombotics/antiplatelets: Secondary | ICD-10-CM | POA: Diagnosis not present

## 2021-10-23 DIAGNOSIS — K219 Gastro-esophageal reflux disease without esophagitis: Secondary | ICD-10-CM | POA: Diagnosis not present

## 2021-10-23 DIAGNOSIS — I110 Hypertensive heart disease with heart failure: Secondary | ICD-10-CM | POA: Diagnosis not present

## 2021-10-23 DIAGNOSIS — J441 Chronic obstructive pulmonary disease with (acute) exacerbation: Secondary | ICD-10-CM | POA: Diagnosis not present

## 2021-10-23 DIAGNOSIS — E7800 Pure hypercholesterolemia, unspecified: Secondary | ICD-10-CM | POA: Diagnosis not present

## 2021-10-23 DIAGNOSIS — I252 Old myocardial infarction: Secondary | ICD-10-CM | POA: Diagnosis not present

## 2021-10-23 DIAGNOSIS — Z85118 Personal history of other malignant neoplasm of bronchus and lung: Secondary | ICD-10-CM | POA: Diagnosis not present

## 2021-10-23 DIAGNOSIS — M1990 Unspecified osteoarthritis, unspecified site: Secondary | ICD-10-CM | POA: Diagnosis not present

## 2021-10-23 DIAGNOSIS — I2510 Atherosclerotic heart disease of native coronary artery without angina pectoris: Secondary | ICD-10-CM | POA: Diagnosis not present

## 2021-10-23 DIAGNOSIS — G8929 Other chronic pain: Secondary | ICD-10-CM | POA: Diagnosis not present

## 2021-10-23 DIAGNOSIS — I5022 Chronic systolic (congestive) heart failure: Secondary | ICD-10-CM | POA: Diagnosis not present

## 2021-10-23 DIAGNOSIS — M5030 Other cervical disc degeneration, unspecified cervical region: Secondary | ICD-10-CM | POA: Diagnosis not present

## 2021-10-24 DIAGNOSIS — I252 Old myocardial infarction: Secondary | ICD-10-CM | POA: Diagnosis not present

## 2021-10-24 DIAGNOSIS — I5022 Chronic systolic (congestive) heart failure: Secondary | ICD-10-CM | POA: Diagnosis not present

## 2021-10-24 DIAGNOSIS — M1990 Unspecified osteoarthritis, unspecified site: Secondary | ICD-10-CM | POA: Diagnosis not present

## 2021-10-24 DIAGNOSIS — G8929 Other chronic pain: Secondary | ICD-10-CM | POA: Diagnosis not present

## 2021-10-24 DIAGNOSIS — K219 Gastro-esophageal reflux disease without esophagitis: Secondary | ICD-10-CM | POA: Diagnosis not present

## 2021-10-24 DIAGNOSIS — Z7902 Long term (current) use of antithrombotics/antiplatelets: Secondary | ICD-10-CM | POA: Diagnosis not present

## 2021-10-24 DIAGNOSIS — Z85118 Personal history of other malignant neoplasm of bronchus and lung: Secondary | ICD-10-CM | POA: Diagnosis not present

## 2021-10-24 DIAGNOSIS — I110 Hypertensive heart disease with heart failure: Secondary | ICD-10-CM | POA: Diagnosis not present

## 2021-10-24 DIAGNOSIS — J441 Chronic obstructive pulmonary disease with (acute) exacerbation: Secondary | ICD-10-CM | POA: Diagnosis not present

## 2021-10-24 DIAGNOSIS — E7800 Pure hypercholesterolemia, unspecified: Secondary | ICD-10-CM | POA: Diagnosis not present

## 2021-10-24 DIAGNOSIS — M5030 Other cervical disc degeneration, unspecified cervical region: Secondary | ICD-10-CM | POA: Diagnosis not present

## 2021-10-24 DIAGNOSIS — I2510 Atherosclerotic heart disease of native coronary artery without angina pectoris: Secondary | ICD-10-CM | POA: Diagnosis not present

## 2021-10-25 DIAGNOSIS — J700 Acute pulmonary manifestations due to radiation: Secondary | ICD-10-CM | POA: Diagnosis not present

## 2021-10-25 DIAGNOSIS — Z87891 Personal history of nicotine dependence: Secondary | ICD-10-CM | POA: Diagnosis not present

## 2021-10-25 DIAGNOSIS — J449 Chronic obstructive pulmonary disease, unspecified: Secondary | ICD-10-CM | POA: Diagnosis not present

## 2021-10-25 DIAGNOSIS — G4733 Obstructive sleep apnea (adult) (pediatric): Secondary | ICD-10-CM | POA: Diagnosis not present

## 2021-10-25 DIAGNOSIS — J4550 Severe persistent asthma, uncomplicated: Secondary | ICD-10-CM | POA: Diagnosis not present

## 2021-10-29 DIAGNOSIS — Z85118 Personal history of other malignant neoplasm of bronchus and lung: Secondary | ICD-10-CM | POA: Diagnosis not present

## 2021-10-29 DIAGNOSIS — E7800 Pure hypercholesterolemia, unspecified: Secondary | ICD-10-CM | POA: Diagnosis not present

## 2021-10-29 DIAGNOSIS — K219 Gastro-esophageal reflux disease without esophagitis: Secondary | ICD-10-CM | POA: Diagnosis not present

## 2021-10-29 DIAGNOSIS — I5022 Chronic systolic (congestive) heart failure: Secondary | ICD-10-CM | POA: Diagnosis not present

## 2021-10-29 DIAGNOSIS — I2510 Atherosclerotic heart disease of native coronary artery without angina pectoris: Secondary | ICD-10-CM | POA: Diagnosis not present

## 2021-10-29 DIAGNOSIS — I252 Old myocardial infarction: Secondary | ICD-10-CM | POA: Diagnosis not present

## 2021-10-29 DIAGNOSIS — J441 Chronic obstructive pulmonary disease with (acute) exacerbation: Secondary | ICD-10-CM | POA: Diagnosis not present

## 2021-10-29 DIAGNOSIS — M5030 Other cervical disc degeneration, unspecified cervical region: Secondary | ICD-10-CM | POA: Diagnosis not present

## 2021-10-29 DIAGNOSIS — M1990 Unspecified osteoarthritis, unspecified site: Secondary | ICD-10-CM | POA: Diagnosis not present

## 2021-10-29 DIAGNOSIS — G8929 Other chronic pain: Secondary | ICD-10-CM | POA: Diagnosis not present

## 2021-10-29 DIAGNOSIS — Z7902 Long term (current) use of antithrombotics/antiplatelets: Secondary | ICD-10-CM | POA: Diagnosis not present

## 2021-10-29 DIAGNOSIS — I110 Hypertensive heart disease with heart failure: Secondary | ICD-10-CM | POA: Diagnosis not present

## 2021-11-05 DIAGNOSIS — J441 Chronic obstructive pulmonary disease with (acute) exacerbation: Secondary | ICD-10-CM | POA: Diagnosis not present

## 2021-11-05 DIAGNOSIS — I5022 Chronic systolic (congestive) heart failure: Secondary | ICD-10-CM | POA: Diagnosis not present

## 2021-11-05 DIAGNOSIS — M1990 Unspecified osteoarthritis, unspecified site: Secondary | ICD-10-CM | POA: Diagnosis not present

## 2021-11-05 DIAGNOSIS — Z85118 Personal history of other malignant neoplasm of bronchus and lung: Secondary | ICD-10-CM | POA: Diagnosis not present

## 2021-11-05 DIAGNOSIS — Z7902 Long term (current) use of antithrombotics/antiplatelets: Secondary | ICD-10-CM | POA: Diagnosis not present

## 2021-11-05 DIAGNOSIS — K219 Gastro-esophageal reflux disease without esophagitis: Secondary | ICD-10-CM | POA: Diagnosis not present

## 2021-11-05 DIAGNOSIS — J439 Emphysema, unspecified: Secondary | ICD-10-CM | POA: Diagnosis not present

## 2021-11-05 DIAGNOSIS — N289 Disorder of kidney and ureter, unspecified: Secondary | ICD-10-CM | POA: Diagnosis not present

## 2021-11-05 DIAGNOSIS — I2510 Atherosclerotic heart disease of native coronary artery without angina pectoris: Secondary | ICD-10-CM | POA: Diagnosis not present

## 2021-11-05 DIAGNOSIS — M5030 Other cervical disc degeneration, unspecified cervical region: Secondary | ICD-10-CM | POA: Diagnosis not present

## 2021-11-05 DIAGNOSIS — I252 Old myocardial infarction: Secondary | ICD-10-CM | POA: Diagnosis not present

## 2021-11-05 DIAGNOSIS — E7800 Pure hypercholesterolemia, unspecified: Secondary | ICD-10-CM | POA: Diagnosis not present

## 2021-11-05 DIAGNOSIS — I110 Hypertensive heart disease with heart failure: Secondary | ICD-10-CM | POA: Diagnosis not present

## 2021-11-05 DIAGNOSIS — E559 Vitamin D deficiency, unspecified: Secondary | ICD-10-CM | POA: Diagnosis not present

## 2021-11-05 DIAGNOSIS — G8929 Other chronic pain: Secondary | ICD-10-CM | POA: Diagnosis not present

## 2021-11-06 ENCOUNTER — Other Ambulatory Visit: Payer: Self-pay | Admitting: Hematology and Oncology

## 2021-11-06 DIAGNOSIS — Z8739 Personal history of other diseases of the musculoskeletal system and connective tissue: Secondary | ICD-10-CM

## 2021-11-06 DIAGNOSIS — G8929 Other chronic pain: Secondary | ICD-10-CM

## 2021-11-06 DIAGNOSIS — M479 Spondylosis, unspecified: Secondary | ICD-10-CM

## 2021-11-06 MED ORDER — MORPHINE SULFATE ER 15 MG PO TBCR: 15.0000 mg | EXTENDED_RELEASE_TABLET | Freq: Two times a day (BID) | ORAL | 0 refills | Status: DC

## 2021-11-07 ENCOUNTER — Telehealth: Payer: Self-pay

## 2021-11-07 ENCOUNTER — Other Ambulatory Visit: Payer: Self-pay | Admitting: Hematology and Oncology

## 2021-11-07 DIAGNOSIS — Z8739 Personal history of other diseases of the musculoskeletal system and connective tissue: Secondary | ICD-10-CM

## 2021-11-07 DIAGNOSIS — G8929 Other chronic pain: Secondary | ICD-10-CM

## 2021-11-07 DIAGNOSIS — M479 Spondylosis, unspecified: Secondary | ICD-10-CM

## 2021-11-07 MED ORDER — OXYCODONE-ACETAMINOPHEN 10-325 MG PO TABS: 1.0000 | ORAL_TABLET | Freq: Four times a day (QID) | ORAL | 0 refills | Status: DC | PRN

## 2021-11-07 NOTE — Telephone Encounter (Signed)
Pt called needing refill on Morphine and oxycodone.  Oxycodone sent to Urgent care and Morphine sent to [facility] on 04/11/8477. ? ?

## 2021-11-08 DIAGNOSIS — J441 Chronic obstructive pulmonary disease with (acute) exacerbation: Secondary | ICD-10-CM | POA: Diagnosis not present

## 2021-11-08 DIAGNOSIS — I2510 Atherosclerotic heart disease of native coronary artery without angina pectoris: Secondary | ICD-10-CM | POA: Diagnosis not present

## 2021-11-08 DIAGNOSIS — M1990 Unspecified osteoarthritis, unspecified site: Secondary | ICD-10-CM | POA: Diagnosis not present

## 2021-11-08 DIAGNOSIS — K219 Gastro-esophageal reflux disease without esophagitis: Secondary | ICD-10-CM | POA: Diagnosis not present

## 2021-11-08 DIAGNOSIS — Z7902 Long term (current) use of antithrombotics/antiplatelets: Secondary | ICD-10-CM | POA: Diagnosis not present

## 2021-11-08 DIAGNOSIS — M5030 Other cervical disc degeneration, unspecified cervical region: Secondary | ICD-10-CM | POA: Diagnosis not present

## 2021-11-08 DIAGNOSIS — I5022 Chronic systolic (congestive) heart failure: Secondary | ICD-10-CM | POA: Diagnosis not present

## 2021-11-08 DIAGNOSIS — G8929 Other chronic pain: Secondary | ICD-10-CM | POA: Diagnosis not present

## 2021-11-08 DIAGNOSIS — E7800 Pure hypercholesterolemia, unspecified: Secondary | ICD-10-CM | POA: Diagnosis not present

## 2021-11-08 DIAGNOSIS — Z85118 Personal history of other malignant neoplasm of bronchus and lung: Secondary | ICD-10-CM | POA: Diagnosis not present

## 2021-11-08 DIAGNOSIS — I252 Old myocardial infarction: Secondary | ICD-10-CM | POA: Diagnosis not present

## 2021-11-08 DIAGNOSIS — I110 Hypertensive heart disease with heart failure: Secondary | ICD-10-CM | POA: Diagnosis not present

## 2021-11-14 DIAGNOSIS — Z85118 Personal history of other malignant neoplasm of bronchus and lung: Secondary | ICD-10-CM | POA: Diagnosis not present

## 2021-11-14 DIAGNOSIS — I252 Old myocardial infarction: Secondary | ICD-10-CM | POA: Diagnosis not present

## 2021-11-14 DIAGNOSIS — I5022 Chronic systolic (congestive) heart failure: Secondary | ICD-10-CM | POA: Diagnosis not present

## 2021-11-14 DIAGNOSIS — M1990 Unspecified osteoarthritis, unspecified site: Secondary | ICD-10-CM | POA: Diagnosis not present

## 2021-11-14 DIAGNOSIS — I110 Hypertensive heart disease with heart failure: Secondary | ICD-10-CM | POA: Diagnosis not present

## 2021-11-14 DIAGNOSIS — K219 Gastro-esophageal reflux disease without esophagitis: Secondary | ICD-10-CM | POA: Diagnosis not present

## 2021-11-14 DIAGNOSIS — G8929 Other chronic pain: Secondary | ICD-10-CM | POA: Diagnosis not present

## 2021-11-14 DIAGNOSIS — E7800 Pure hypercholesterolemia, unspecified: Secondary | ICD-10-CM | POA: Diagnosis not present

## 2021-11-14 DIAGNOSIS — J441 Chronic obstructive pulmonary disease with (acute) exacerbation: Secondary | ICD-10-CM | POA: Diagnosis not present

## 2021-11-14 DIAGNOSIS — Z7902 Long term (current) use of antithrombotics/antiplatelets: Secondary | ICD-10-CM | POA: Diagnosis not present

## 2021-11-14 DIAGNOSIS — M5030 Other cervical disc degeneration, unspecified cervical region: Secondary | ICD-10-CM | POA: Diagnosis not present

## 2021-11-14 DIAGNOSIS — I2510 Atherosclerotic heart disease of native coronary artery without angina pectoris: Secondary | ICD-10-CM | POA: Diagnosis not present

## 2021-11-23 DIAGNOSIS — I110 Hypertensive heart disease with heart failure: Secondary | ICD-10-CM | POA: Diagnosis not present

## 2021-11-23 DIAGNOSIS — E7800 Pure hypercholesterolemia, unspecified: Secondary | ICD-10-CM | POA: Diagnosis not present

## 2021-11-23 DIAGNOSIS — Z85118 Personal history of other malignant neoplasm of bronchus and lung: Secondary | ICD-10-CM | POA: Diagnosis not present

## 2021-11-23 DIAGNOSIS — K219 Gastro-esophageal reflux disease without esophagitis: Secondary | ICD-10-CM | POA: Diagnosis not present

## 2021-11-23 DIAGNOSIS — M1990 Unspecified osteoarthritis, unspecified site: Secondary | ICD-10-CM | POA: Diagnosis not present

## 2021-11-23 DIAGNOSIS — M5030 Other cervical disc degeneration, unspecified cervical region: Secondary | ICD-10-CM | POA: Diagnosis not present

## 2021-11-23 DIAGNOSIS — I252 Old myocardial infarction: Secondary | ICD-10-CM | POA: Diagnosis not present

## 2021-11-23 DIAGNOSIS — I2510 Atherosclerotic heart disease of native coronary artery without angina pectoris: Secondary | ICD-10-CM | POA: Diagnosis not present

## 2021-11-23 DIAGNOSIS — Z7902 Long term (current) use of antithrombotics/antiplatelets: Secondary | ICD-10-CM | POA: Diagnosis not present

## 2021-11-23 DIAGNOSIS — J441 Chronic obstructive pulmonary disease with (acute) exacerbation: Secondary | ICD-10-CM | POA: Diagnosis not present

## 2021-11-23 DIAGNOSIS — I5022 Chronic systolic (congestive) heart failure: Secondary | ICD-10-CM | POA: Diagnosis not present

## 2021-11-23 DIAGNOSIS — G8929 Other chronic pain: Secondary | ICD-10-CM | POA: Diagnosis not present

## 2021-11-30 DIAGNOSIS — Z85118 Personal history of other malignant neoplasm of bronchus and lung: Secondary | ICD-10-CM | POA: Diagnosis not present

## 2021-11-30 DIAGNOSIS — E7800 Pure hypercholesterolemia, unspecified: Secondary | ICD-10-CM | POA: Diagnosis not present

## 2021-11-30 DIAGNOSIS — I5022 Chronic systolic (congestive) heart failure: Secondary | ICD-10-CM | POA: Diagnosis not present

## 2021-11-30 DIAGNOSIS — J441 Chronic obstructive pulmonary disease with (acute) exacerbation: Secondary | ICD-10-CM | POA: Diagnosis not present

## 2021-11-30 DIAGNOSIS — K219 Gastro-esophageal reflux disease without esophagitis: Secondary | ICD-10-CM | POA: Diagnosis not present

## 2021-11-30 DIAGNOSIS — I110 Hypertensive heart disease with heart failure: Secondary | ICD-10-CM | POA: Diagnosis not present

## 2021-11-30 DIAGNOSIS — M1990 Unspecified osteoarthritis, unspecified site: Secondary | ICD-10-CM | POA: Diagnosis not present

## 2021-11-30 DIAGNOSIS — I252 Old myocardial infarction: Secondary | ICD-10-CM | POA: Diagnosis not present

## 2021-11-30 DIAGNOSIS — G8929 Other chronic pain: Secondary | ICD-10-CM | POA: Diagnosis not present

## 2021-11-30 DIAGNOSIS — Z7902 Long term (current) use of antithrombotics/antiplatelets: Secondary | ICD-10-CM | POA: Diagnosis not present

## 2021-11-30 DIAGNOSIS — I2510 Atherosclerotic heart disease of native coronary artery without angina pectoris: Secondary | ICD-10-CM | POA: Diagnosis not present

## 2021-11-30 DIAGNOSIS — M5030 Other cervical disc degeneration, unspecified cervical region: Secondary | ICD-10-CM | POA: Diagnosis not present

## 2021-12-03 ENCOUNTER — Other Ambulatory Visit: Payer: Self-pay | Admitting: Hematology and Oncology

## 2021-12-03 DIAGNOSIS — Z8739 Personal history of other diseases of the musculoskeletal system and connective tissue: Secondary | ICD-10-CM

## 2021-12-03 DIAGNOSIS — M479 Spondylosis, unspecified: Secondary | ICD-10-CM

## 2021-12-03 DIAGNOSIS — G8929 Other chronic pain: Secondary | ICD-10-CM

## 2021-12-03 MED ORDER — MORPHINE SULFATE ER 15 MG PO TBCR: 15.0000 mg | EXTENDED_RELEASE_TABLET | Freq: Two times a day (BID) | ORAL | 0 refills | Status: DC

## 2021-12-03 MED ORDER — OXYCODONE-ACETAMINOPHEN 10-325 MG PO TABS: 1.0000 | ORAL_TABLET | Freq: Four times a day (QID) | ORAL | 0 refills | Status: DC | PRN

## 2021-12-04 DIAGNOSIS — I252 Old myocardial infarction: Secondary | ICD-10-CM | POA: Diagnosis not present

## 2021-12-04 DIAGNOSIS — I5022 Chronic systolic (congestive) heart failure: Secondary | ICD-10-CM | POA: Diagnosis not present

## 2021-12-04 DIAGNOSIS — Z85118 Personal history of other malignant neoplasm of bronchus and lung: Secondary | ICD-10-CM | POA: Diagnosis not present

## 2021-12-04 DIAGNOSIS — G8929 Other chronic pain: Secondary | ICD-10-CM | POA: Diagnosis not present

## 2021-12-04 DIAGNOSIS — M5030 Other cervical disc degeneration, unspecified cervical region: Secondary | ICD-10-CM | POA: Diagnosis not present

## 2021-12-04 DIAGNOSIS — K219 Gastro-esophageal reflux disease without esophagitis: Secondary | ICD-10-CM | POA: Diagnosis not present

## 2021-12-04 DIAGNOSIS — M1990 Unspecified osteoarthritis, unspecified site: Secondary | ICD-10-CM | POA: Diagnosis not present

## 2021-12-04 DIAGNOSIS — J441 Chronic obstructive pulmonary disease with (acute) exacerbation: Secondary | ICD-10-CM | POA: Diagnosis not present

## 2021-12-04 DIAGNOSIS — I110 Hypertensive heart disease with heart failure: Secondary | ICD-10-CM | POA: Diagnosis not present

## 2021-12-04 DIAGNOSIS — I2510 Atherosclerotic heart disease of native coronary artery without angina pectoris: Secondary | ICD-10-CM | POA: Diagnosis not present

## 2021-12-04 DIAGNOSIS — Z7902 Long term (current) use of antithrombotics/antiplatelets: Secondary | ICD-10-CM | POA: Diagnosis not present

## 2021-12-04 DIAGNOSIS — E7800 Pure hypercholesterolemia, unspecified: Secondary | ICD-10-CM | POA: Diagnosis not present

## 2021-12-05 ENCOUNTER — Other Ambulatory Visit: Payer: Self-pay | Admitting: Hematology and Oncology

## 2021-12-05 DIAGNOSIS — M479 Spondylosis, unspecified: Secondary | ICD-10-CM

## 2021-12-05 DIAGNOSIS — G8929 Other chronic pain: Secondary | ICD-10-CM

## 2021-12-05 DIAGNOSIS — Z8739 Personal history of other diseases of the musculoskeletal system and connective tissue: Secondary | ICD-10-CM

## 2021-12-05 DIAGNOSIS — J439 Emphysema, unspecified: Secondary | ICD-10-CM | POA: Diagnosis not present

## 2021-12-13 DIAGNOSIS — G4733 Obstructive sleep apnea (adult) (pediatric): Secondary | ICD-10-CM | POA: Diagnosis not present

## 2021-12-27 DIAGNOSIS — Z87891 Personal history of nicotine dependence: Secondary | ICD-10-CM | POA: Diagnosis not present

## 2021-12-27 DIAGNOSIS — J4550 Severe persistent asthma, uncomplicated: Secondary | ICD-10-CM | POA: Diagnosis not present

## 2021-12-27 DIAGNOSIS — G4733 Obstructive sleep apnea (adult) (pediatric): Secondary | ICD-10-CM | POA: Diagnosis not present

## 2021-12-27 DIAGNOSIS — J700 Acute pulmonary manifestations due to radiation: Secondary | ICD-10-CM | POA: Diagnosis not present

## 2021-12-30 IMAGING — CT CT ABD-PELV W/O CM
2 of 4 series · 17 of 46 positions shown, 19 images · non-contrast
Comparison: 12/04/2019 CT AP with contrast

CLINICAL DATA: History of lung cancer, acute abdominal pain

EXAM:
CT ABDOMEN AND PELVIS WITHOUT CONTRAST
TECHNIQUE: Multidetector CT imaging of the abdomen and pelvis was performed
following the standard protocol without IV contrast.

[Series 3: a/p w/o 5mm · axial · non-contrast · 0.82mm/px · z∈[+745,+1145]mm · 14 of 88 slices shown, 16 images]
[im 4/88  soft-tissue]
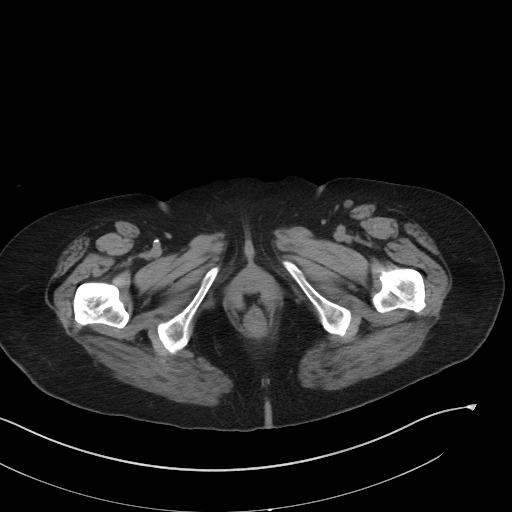
[im 4/88  bone]
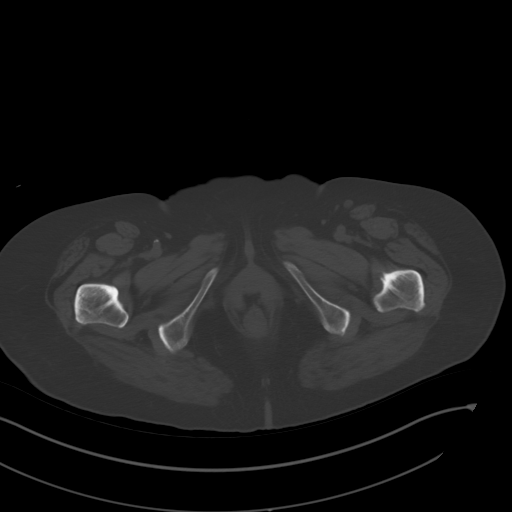
[im 11/88  soft-tissue]
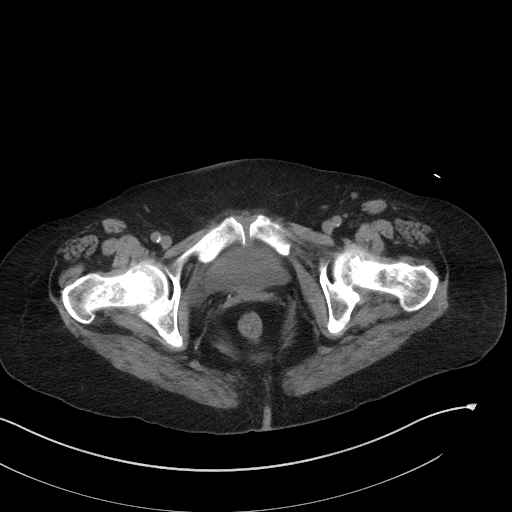
[im 19/88  soft-tissue]
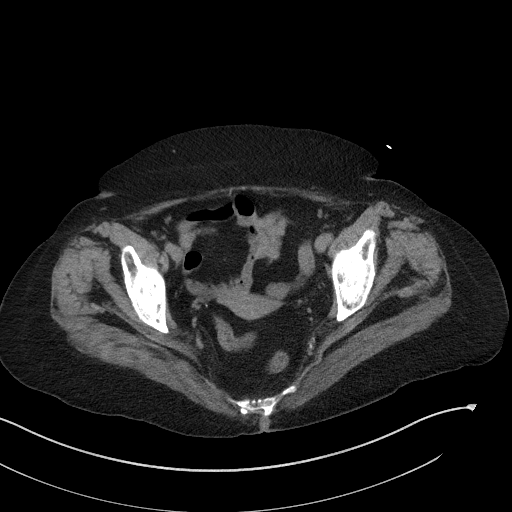
[im 22/88  soft-tissue]
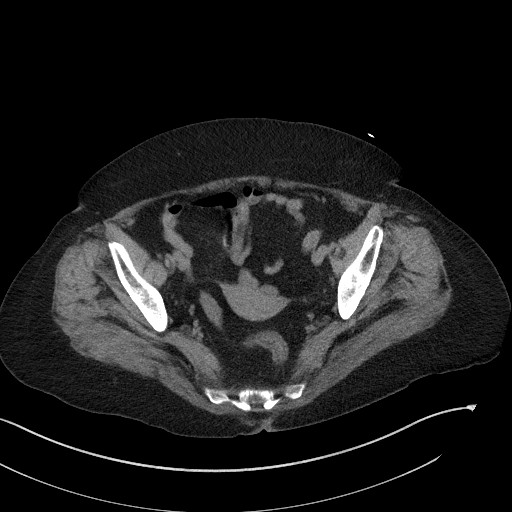
[im 30/88  soft-tissue]
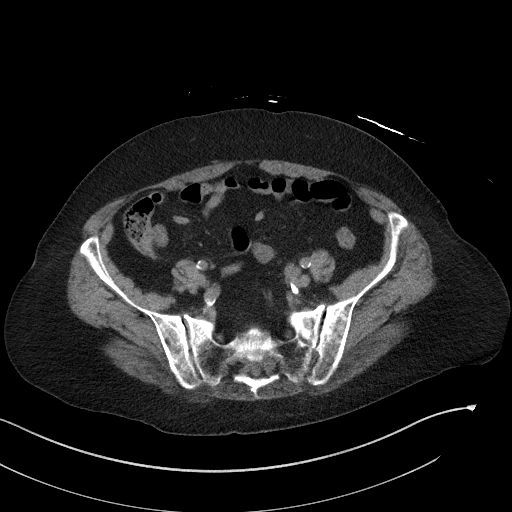
[im 37/88  soft-tissue]
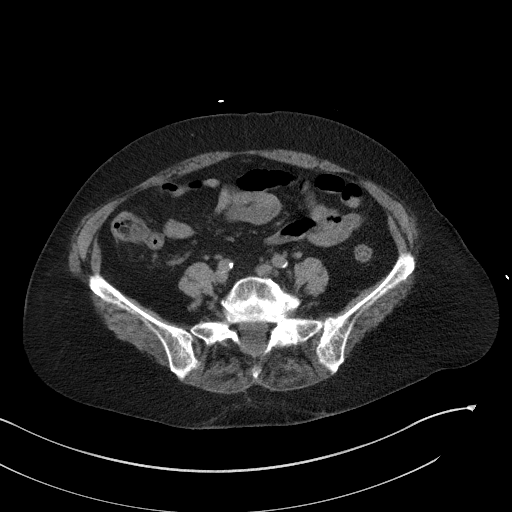
[im 40/88  soft-tissue]
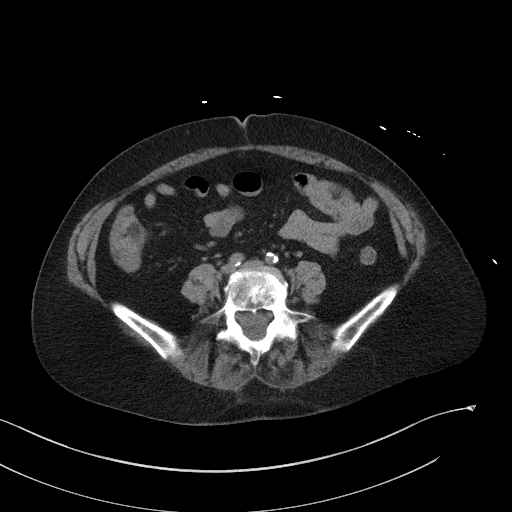
[im 48/88  soft-tissue]
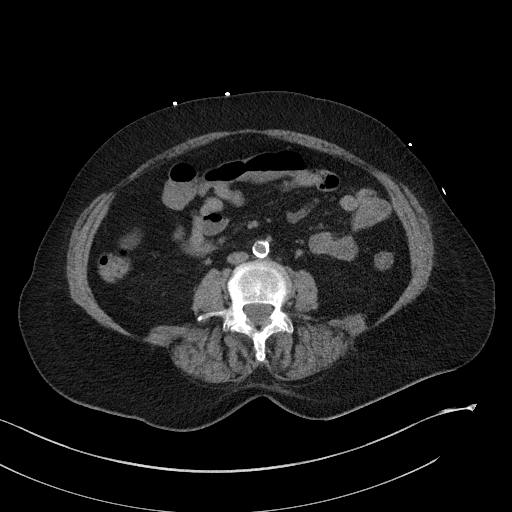
[im 51/88  soft-tissue]
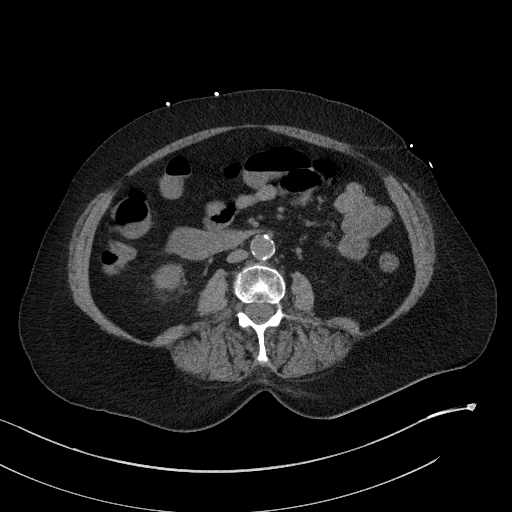
[im 51/88  bone]
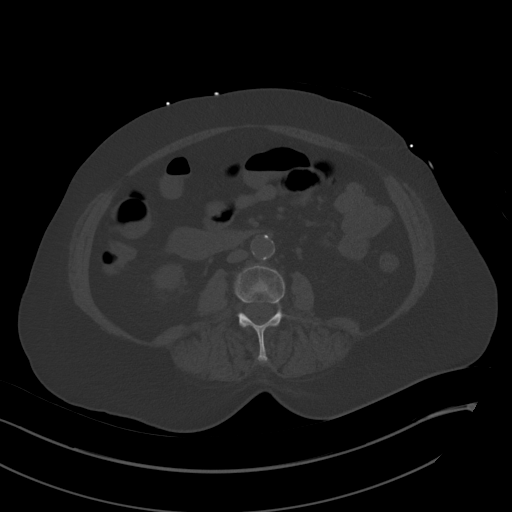
[im 59/88  soft-tissue]
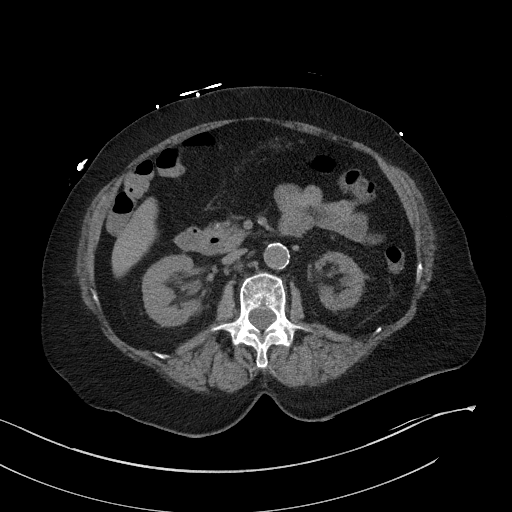
[im 66/88  soft-tissue]
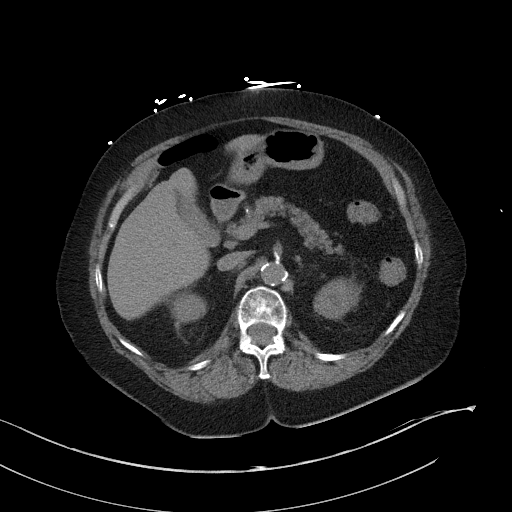
[im 69/88  soft-tissue]
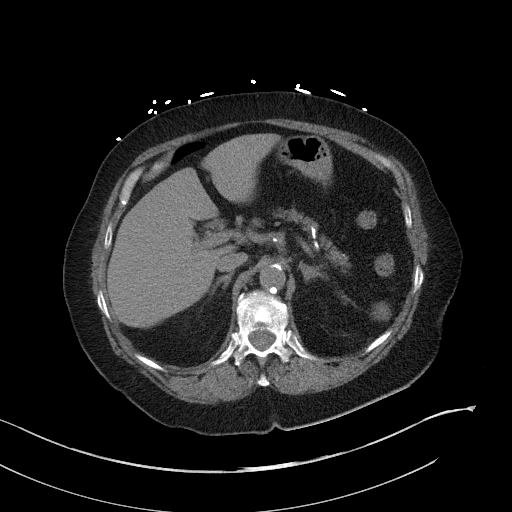
[im 77/88  soft-tissue]
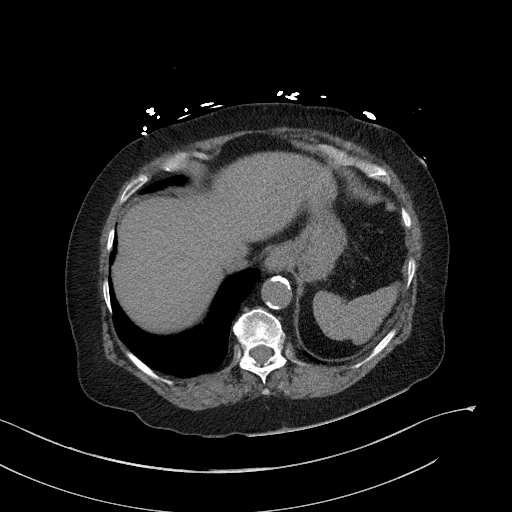
[im 84/88  soft-tissue]
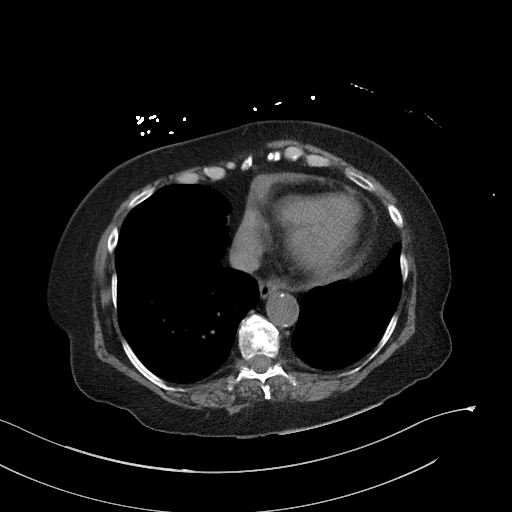

[Series 6: a/p w/o cor · coronal · non-contrast · 0.86mm/px · 3 of 150 slices shown]
[im 50/150  soft-tissue]
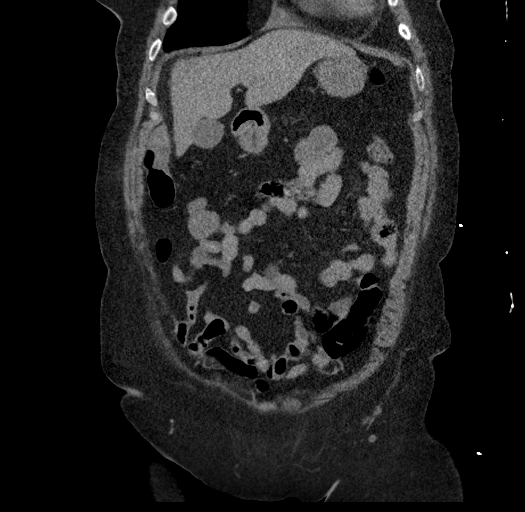
[im 67/150  soft-tissue]
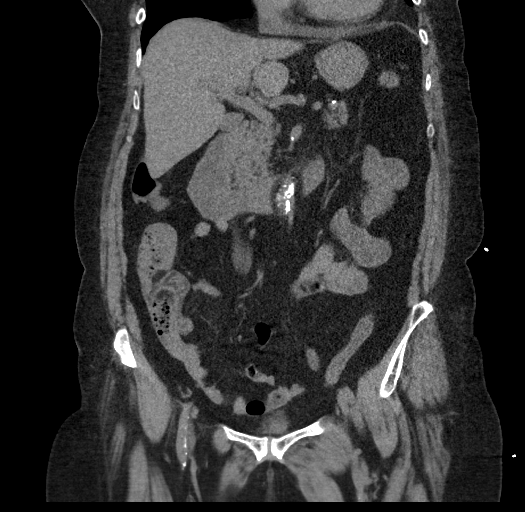
[im 83/150  soft-tissue]
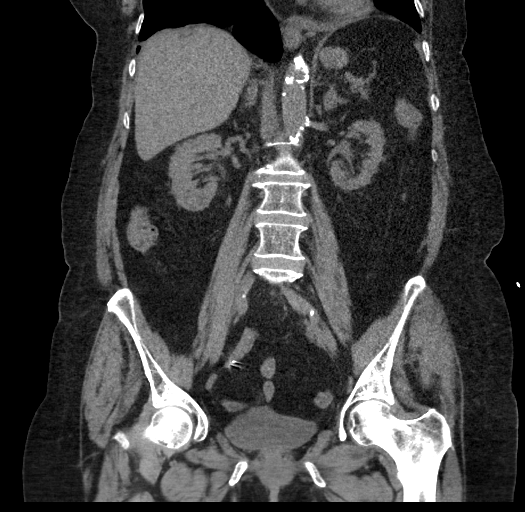

[17 of 46 positions shown; findings below may reference images not displayed]

FINDINGS: Lower chest: No acute abnormality.

Hepatobiliary: Limited without IV contrast. No large focal hepatic
abnormality or intrahepatic biliary dilatation. Gallbladder
nondistended. Biliary system unremarkable. Common bile duct
nondilated.

Pancreas: Unremarkable. No pancreatic ductal dilatation or
surrounding inflammatory changes.

Spleen: Normal in size without focal abnormality.

Adrenals/Urinary Tract: Adrenal glands are unremarkable. Kidneys are
normal, without renal calculi, focal lesion, or hydronephrosis.
Bladder is unremarkable.

Stomach/Bowel: Limited without oral contrast. Negative for bowel
obstruction, significant dilatation, ileus, or free air. No free
fluid, fluid collection, hemorrhage, hematoma, abscess or ascites.

Vascular/Lymphatic: Aortoiliac atherosclerosis without aneurysm. No
retroperitoneal hemorrhage or hematoma. No bulky adenopathy.

Reproductive: Tubal ligation clips noted. No adnexal abnormality.
Uterus normal in size for age. No pelvic free fluid.

Other: No abdominal wall hernia or abnormality. No abdominopelvic
ascites.

Musculoskeletal: Degenerative changes of the spine. Bones are
osteopenic. No acute osseous finding.
IMPRESSION: No acute intra-abdominal or pelvic finding by noncontrast CT.

Aortic Atherosclerosis ([id number]).

## 2021-12-30 IMAGING — DX DG CHEST 2V
2 series · 2 of 2 positions shown · non-contrast
Comparison: None.

CLINICAL DATA: Cough

EXAM:
CHEST - 2 VIEW

[chest lat]
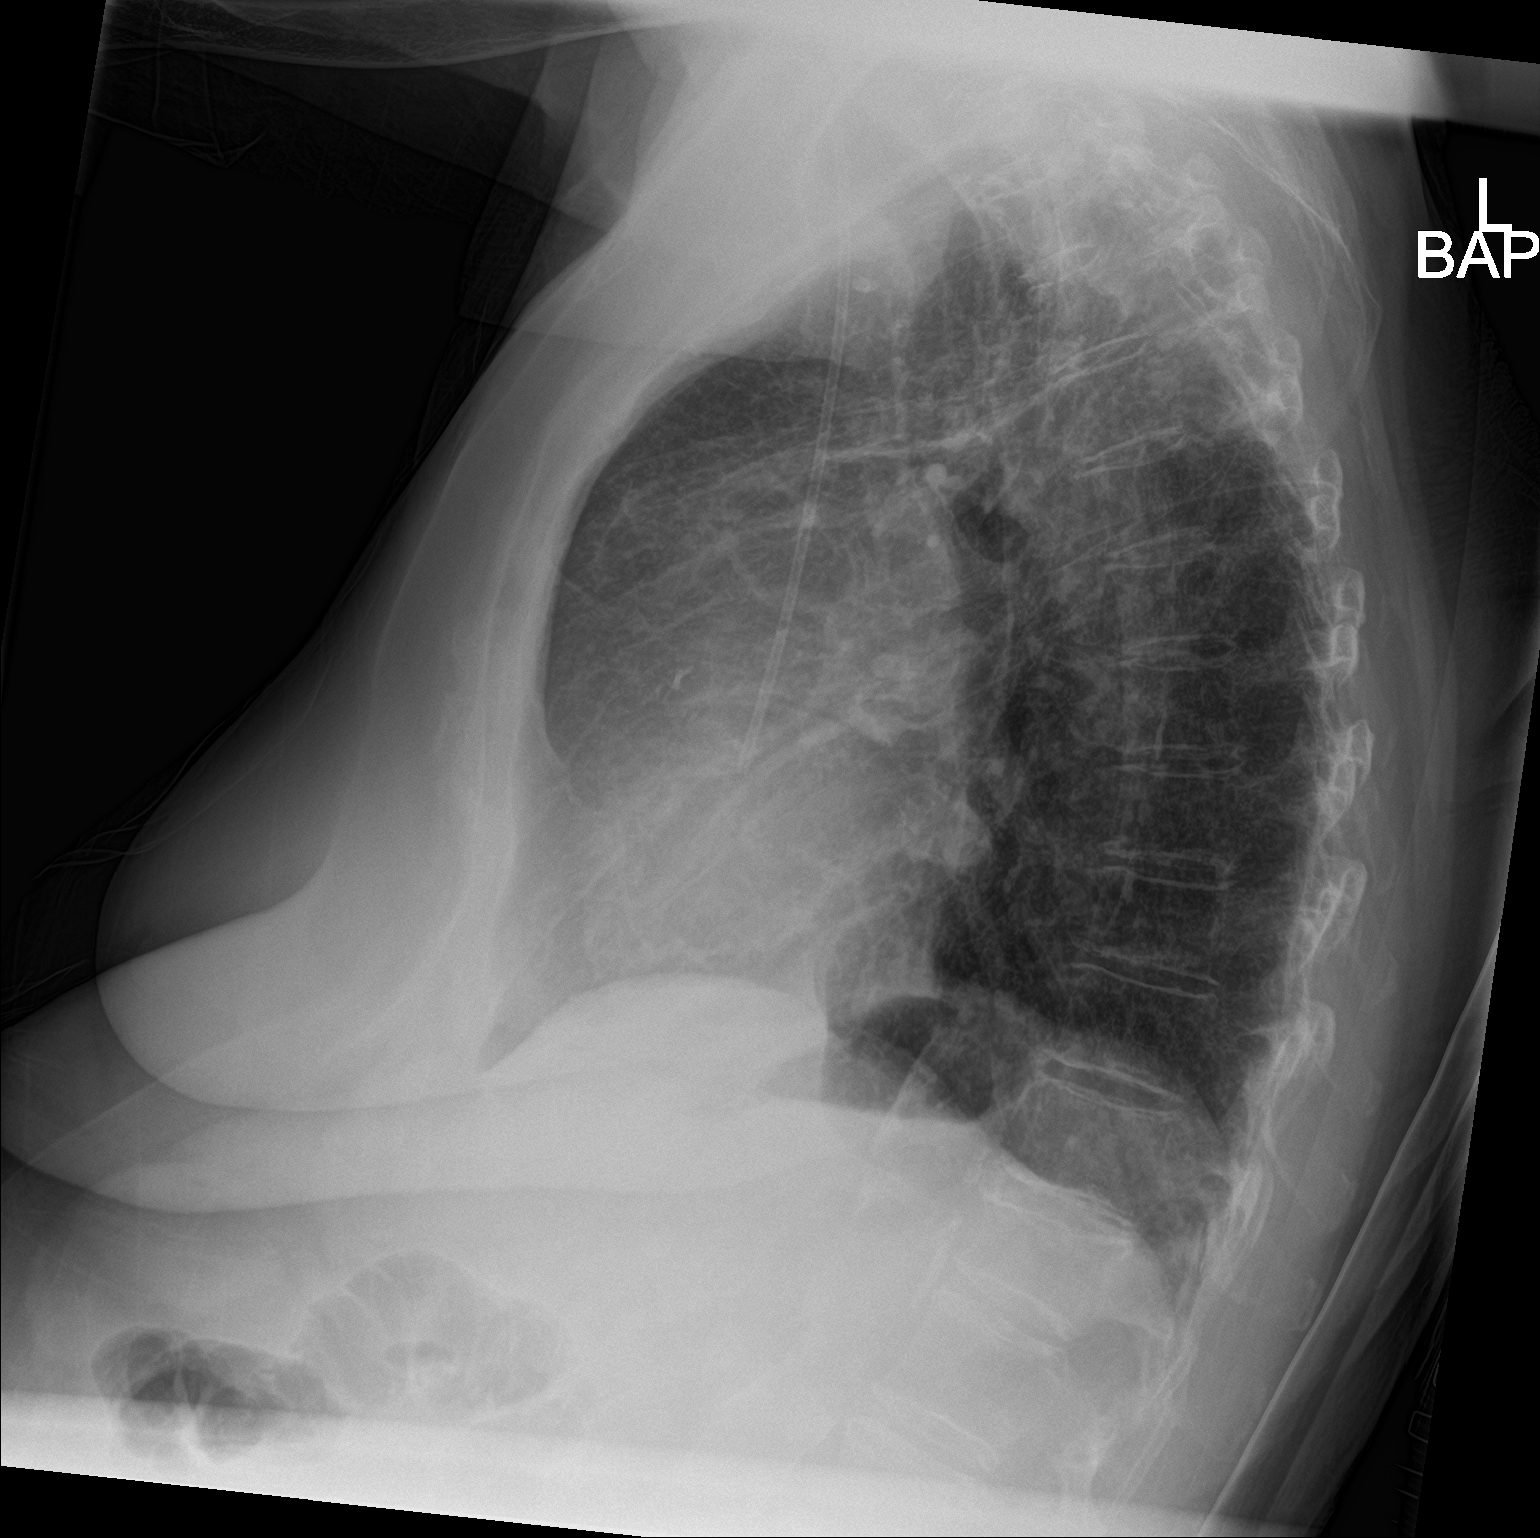

[chest ap]
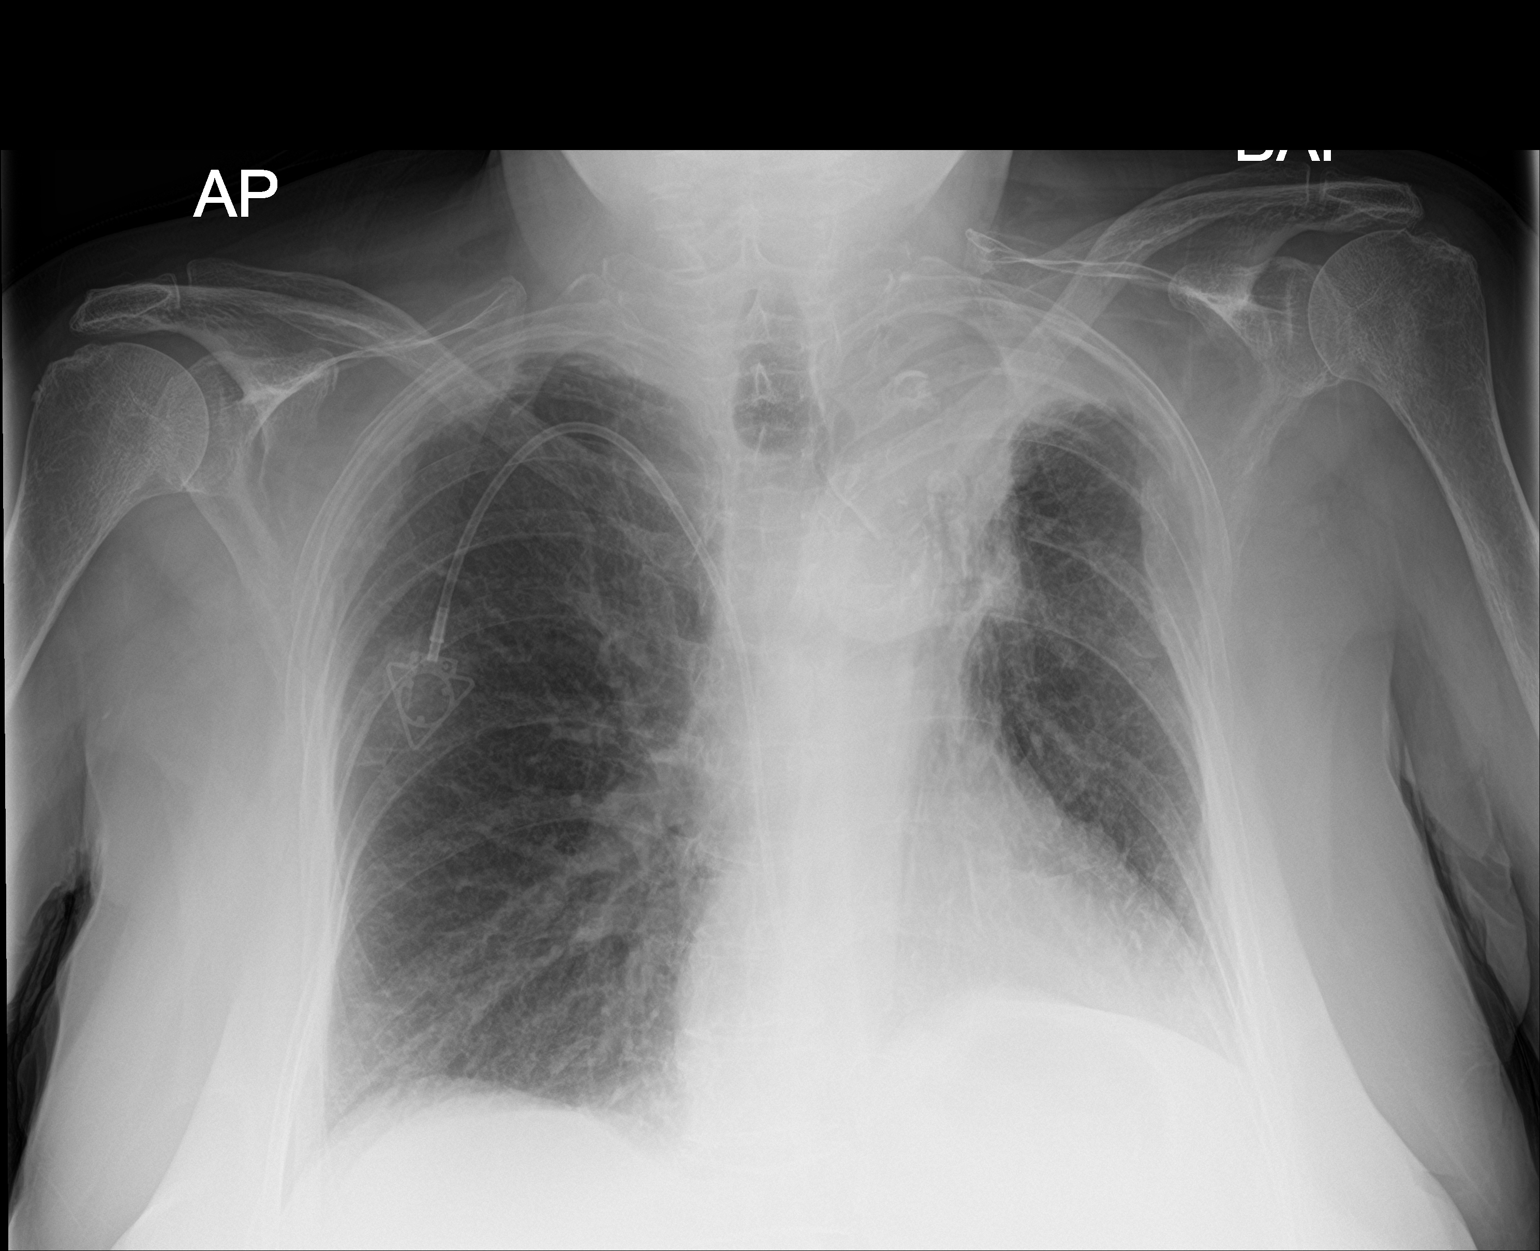

[2 of 2 positions shown; findings below may reference images not displayed]

FINDINGS: The heart size and mediastinal contours are within normal limits. A
right-sided MediPort catheter seen with the tip at the superior
cavoatrial junction. Left apical pleural scarring with post
treatment changes is again noted. The visualized skeletal structures
are unremarkable.
IMPRESSION: No active cardiopulmonary disease.

## 2022-01-04 ENCOUNTER — Other Ambulatory Visit: Payer: Self-pay

## 2022-01-04 DIAGNOSIS — G8929 Other chronic pain: Secondary | ICD-10-CM

## 2022-01-04 DIAGNOSIS — Z8739 Personal history of other diseases of the musculoskeletal system and connective tissue: Secondary | ICD-10-CM

## 2022-01-04 DIAGNOSIS — M479 Spondylosis, unspecified: Secondary | ICD-10-CM

## 2022-01-04 MED ORDER — OXYCODONE-ACETAMINOPHEN 10-325 MG PO TABS: 1.0000 | ORAL_TABLET | Freq: Four times a day (QID) | ORAL | 0 refills | Status: DC | PRN

## 2022-01-04 MED ORDER — MORPHINE SULFATE ER 15 MG PO TBCR: 15.0000 mg | EXTENDED_RELEASE_TABLET | Freq: Two times a day (BID) | ORAL | 0 refills | Status: DC

## 2022-01-05 DIAGNOSIS — J439 Emphysema, unspecified: Secondary | ICD-10-CM | POA: Diagnosis not present

## 2022-01-09 ENCOUNTER — Other Ambulatory Visit: Payer: Self-pay | Admitting: Hematology and Oncology

## 2022-01-09 DIAGNOSIS — G8929 Other chronic pain: Secondary | ICD-10-CM

## 2022-01-09 DIAGNOSIS — Z8739 Personal history of other diseases of the musculoskeletal system and connective tissue: Secondary | ICD-10-CM

## 2022-01-09 DIAGNOSIS — M479 Spondylosis, unspecified: Secondary | ICD-10-CM

## 2022-01-09 MED ORDER — OXYCODONE-ACETAMINOPHEN 10-325 MG PO TABS: 1.0000 | ORAL_TABLET | Freq: Four times a day (QID) | ORAL | 0 refills | Status: DC | PRN

## 2022-01-18 ENCOUNTER — Ambulatory Visit: Payer: Medicare Other | Admitting: Cardiology

## 2022-02-01 ENCOUNTER — Other Ambulatory Visit: Payer: Self-pay

## 2022-02-01 DIAGNOSIS — Z8739 Personal history of other diseases of the musculoskeletal system and connective tissue: Secondary | ICD-10-CM

## 2022-02-01 DIAGNOSIS — M479 Spondylosis, unspecified: Secondary | ICD-10-CM

## 2022-02-01 DIAGNOSIS — G8929 Other chronic pain: Secondary | ICD-10-CM

## 2022-02-01 MED ORDER — MORPHINE SULFATE ER 15 MG PO TBCR: 15.0000 mg | EXTENDED_RELEASE_TABLET | Freq: Two times a day (BID) | ORAL | 0 refills | Status: DC

## 2022-02-01 MED ORDER — OXYCODONE-ACETAMINOPHEN 10-325 MG PO TABS: 1.0000 | ORAL_TABLET | Freq: Four times a day (QID) | ORAL | 0 refills | Status: DC | PRN

## 2022-02-04 DIAGNOSIS — J439 Emphysema, unspecified: Secondary | ICD-10-CM | POA: Diagnosis not present

## 2022-02-08 DIAGNOSIS — G4733 Obstructive sleep apnea (adult) (pediatric): Secondary | ICD-10-CM | POA: Diagnosis not present

## 2022-02-21 DIAGNOSIS — G4733 Obstructive sleep apnea (adult) (pediatric): Secondary | ICD-10-CM | POA: Diagnosis not present

## 2022-02-21 DIAGNOSIS — Z87891 Personal history of nicotine dependence: Secondary | ICD-10-CM | POA: Diagnosis not present

## 2022-02-21 DIAGNOSIS — J4550 Severe persistent asthma, uncomplicated: Secondary | ICD-10-CM | POA: Diagnosis not present

## 2022-02-21 DIAGNOSIS — J700 Acute pulmonary manifestations due to radiation: Secondary | ICD-10-CM | POA: Diagnosis not present

## 2022-03-04 ENCOUNTER — Other Ambulatory Visit: Payer: Self-pay | Admitting: Hematology and Oncology

## 2022-03-04 DIAGNOSIS — G8929 Other chronic pain: Secondary | ICD-10-CM

## 2022-03-04 DIAGNOSIS — M479 Spondylosis, unspecified: Secondary | ICD-10-CM

## 2022-03-04 DIAGNOSIS — Z8739 Personal history of other diseases of the musculoskeletal system and connective tissue: Secondary | ICD-10-CM

## 2022-03-04 MED ORDER — MORPHINE SULFATE ER 15 MG PO TBCR: 15.0000 mg | EXTENDED_RELEASE_TABLET | Freq: Two times a day (BID) | ORAL | 0 refills | Status: DC

## 2022-03-04 MED ORDER — OXYCODONE-ACETAMINOPHEN 10-325 MG PO TABS: 1.0000 | ORAL_TABLET | Freq: Four times a day (QID) | ORAL | 0 refills | Status: DC | PRN

## 2022-03-06 DIAGNOSIS — K5909 Other constipation: Secondary | ICD-10-CM | POA: Diagnosis not present

## 2022-03-06 DIAGNOSIS — E785 Hyperlipidemia, unspecified: Secondary | ICD-10-CM | POA: Diagnosis not present

## 2022-03-06 DIAGNOSIS — E538 Deficiency of other specified B group vitamins: Secondary | ICD-10-CM | POA: Diagnosis not present

## 2022-03-06 DIAGNOSIS — R600 Localized edema: Secondary | ICD-10-CM | POA: Diagnosis not present

## 2022-03-06 DIAGNOSIS — E559 Vitamin D deficiency, unspecified: Secondary | ICD-10-CM | POA: Diagnosis not present

## 2022-03-06 DIAGNOSIS — G4700 Insomnia, unspecified: Secondary | ICD-10-CM | POA: Diagnosis not present

## 2022-03-06 DIAGNOSIS — J439 Emphysema, unspecified: Secondary | ICD-10-CM | POA: Diagnosis not present

## 2022-03-06 DIAGNOSIS — D509 Iron deficiency anemia, unspecified: Secondary | ICD-10-CM | POA: Diagnosis not present

## 2022-03-06 LAB — HEPATIC FUNCTION PANEL
ALT: 5 U/L — AB (ref 7–35)
AST: 12 — AB (ref 13–35)
Alkaline Phosphatase: 54 (ref 25–125)
Bilirubin, Total: 0.2

## 2022-03-06 LAB — COMPREHENSIVE METABOLIC PANEL
Albumin: 4.1 (ref 3.5–5.0)
Calcium: 9.1 (ref 8.7–10.7)
Globulin: 2.4
eGFR: 46

## 2022-03-06 LAB — BASIC METABOLIC PANEL
BUN: 13 (ref 4–21)
CO2: 21 (ref 13–22)
Chloride: 105 (ref 99–108)
Creatinine: 1.2 — AB (ref 0.5–1.1)
Glucose: 118
Potassium: 4.9 mEq/L (ref 3.5–5.1)
Sodium: 142 (ref 137–147)

## 2022-03-06 LAB — CBC: RBC: 3.67 — AB (ref 3.87–5.11)

## 2022-03-06 LAB — VITAMIN B12: Vitamin B-12: 349

## 2022-03-06 LAB — CBC AND DIFFERENTIAL
HCT: 34 — AB (ref 36–46)
Hemoglobin: 11 — AB (ref 12.0–16.0)
Neutrophils Absolute: 4.8
Platelets: 293 10*3/uL (ref 150–400)
WBC: 7.5

## 2022-03-06 LAB — LIPID PANEL
Cholesterol: 126 (ref 0–200)
HDL: 56 (ref 35–70)
LDL Cholesterol: 50
LDl/HDL Ratio: 0.9
Triglycerides: 108 (ref 40–160)

## 2022-03-06 LAB — VITAMIN D 25 HYDROXY (VIT D DEFICIENCY, FRACTURES): Vit D, 25-Hydroxy: 33.3

## 2022-03-07 DIAGNOSIS — J439 Emphysema, unspecified: Secondary | ICD-10-CM | POA: Diagnosis not present

## 2022-03-08 DIAGNOSIS — G4733 Obstructive sleep apnea (adult) (pediatric): Secondary | ICD-10-CM | POA: Diagnosis not present

## 2022-03-11 ENCOUNTER — Telehealth: Payer: Self-pay | Admitting: Cardiology

## 2022-03-11 DIAGNOSIS — I3139 Other pericardial effusion (noninflammatory): Secondary | ICD-10-CM | POA: Diagnosis not present

## 2022-03-11 DIAGNOSIS — J432 Centrilobular emphysema: Secondary | ICD-10-CM | POA: Diagnosis not present

## 2022-03-11 DIAGNOSIS — Z122 Encounter for screening for malignant neoplasm of respiratory organs: Secondary | ICD-10-CM | POA: Diagnosis not present

## 2022-03-11 DIAGNOSIS — Z87891 Personal history of nicotine dependence: Secondary | ICD-10-CM | POA: Diagnosis not present

## 2022-03-11 DIAGNOSIS — I700 Atherosclerosis of aorta: Secondary | ICD-10-CM | POA: Diagnosis not present

## 2022-03-11 DIAGNOSIS — I2510 Atherosclerotic heart disease of native coronary artery without angina pectoris: Secondary | ICD-10-CM | POA: Diagnosis not present

## 2022-03-11 NOTE — Telephone Encounter (Signed)
Pt c/o swelling: STAT is pt has developed SOB within 24 hours  How much weight have you gained and in what time span?  Unsure   If swelling, where is the swelling located?  Legs  Are you currently taking a fluid pill? No, but PCP recently gave her 3 tablets, but she doesn't know the name of them   Are you currently SOB?  Not currently with patient  Do you have a log of your daily weights (if so, list)?  No   Have you gained 3 pounds in a day or 5 pounds in a week?  Have you traveled recently?  No

## 2022-03-12 ENCOUNTER — Telehealth: Payer: Self-pay

## 2022-03-12 ENCOUNTER — Other Ambulatory Visit: Payer: Self-pay

## 2022-03-12 MED ORDER — FUROSEMIDE 20 MG PO TABS: 10.0000 mg | ORAL_TABLET | Freq: Every day | ORAL | 3 refills | Status: DC

## 2022-03-12 NOTE — Telephone Encounter (Signed)
Spoke with daughter, Maudie Mercury- Per DPR. Pt has not been on Lasix daily. Per Dr. Agustin Cree. Take lasix 10mg  daily. Pt's daughter agreed and verbalized understanding. Labs obtained from PCP and reviewed by Dr. Agustin Cree.

## 2022-03-14 DIAGNOSIS — E538 Deficiency of other specified B group vitamins: Secondary | ICD-10-CM | POA: Diagnosis not present

## 2022-03-27 ENCOUNTER — Encounter: Payer: Self-pay | Admitting: Oncology

## 2022-03-27 ENCOUNTER — Inpatient Hospital Stay: Payer: Medicare Other | Attending: Oncology | Admitting: Oncology

## 2022-03-27 ENCOUNTER — Other Ambulatory Visit: Payer: Self-pay

## 2022-03-27 ENCOUNTER — Other Ambulatory Visit: Payer: Self-pay | Admitting: Oncology

## 2022-03-27 ENCOUNTER — Inpatient Hospital Stay: Payer: Medicare Other | Admitting: Oncology

## 2022-03-27 VITALS — BP 129/61 | HR 79 | Temp 98.3°F | Resp 20 | Ht 66.0 in | Wt 177.0 lb

## 2022-03-27 DIAGNOSIS — C3490 Malignant neoplasm of unspecified part of unspecified bronchus or lung: Secondary | ICD-10-CM | POA: Diagnosis not present

## 2022-03-27 DIAGNOSIS — C3412 Malignant neoplasm of upper lobe, left bronchus or lung: Secondary | ICD-10-CM

## 2022-03-27 DIAGNOSIS — D649 Anemia, unspecified: Secondary | ICD-10-CM | POA: Insufficient documentation

## 2022-03-27 DIAGNOSIS — Z85038 Personal history of other malignant neoplasm of large intestine: Secondary | ICD-10-CM | POA: Insufficient documentation

## 2022-03-27 DIAGNOSIS — D539 Nutritional anemia, unspecified: Secondary | ICD-10-CM

## 2022-03-27 LAB — IRON AND TIBC
Iron: 53 ug/dL (ref 28–170)
Saturation Ratios: 17 % (ref 10.4–31.8)
TIBC: 314 ug/dL (ref 250–450)
UIBC: 261 ug/dL

## 2022-03-27 LAB — CBC AND DIFFERENTIAL
HCT: 34 — AB (ref 36–46)
Hemoglobin: 11.3 — AB (ref 12.0–16.0)
Neutrophils Absolute: 5.28
Platelets: 288 10*3/uL (ref 150–400)
WBC: 8

## 2022-03-27 LAB — FOLATE: Folate: 5.4 ng/mL — ABNORMAL LOW (ref >5.9)

## 2022-03-27 LAB — FERRITIN: Ferritin: 89 ng/mL (ref 11–307)

## 2022-03-27 LAB — VITAMIN B12: Vitamin B-12: 418 pg/mL (ref 180–914)

## 2022-03-27 LAB — CBC: RBC: 3.77 — AB (ref 3.87–5.11)

## 2022-03-27 NOTE — Progress Notes (Signed)
[place]  [address] 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 She was treated with concurrent radiation and chemotherapy, which included 6 cycles of carboplatin and Etoposide, that was completed in October 2014. She has restaging scans every 3 months that have remained stable.  A PET scan in March 2015 showed only mild to moderate metabolic activity in the area of her original tumor.  She does have degenerative disc disease and osteoarthritis with chronic bone pain.  We have agreed to prescribe pain medication for her as she is unable to obtain a primary care provider.  In addition, she has significant osteopenia and was placed on Fosamax along with calcium and vitamin-D.  She continues to complain of lower back pain, and rates it as an 8 and constant..  She states the Percocet 10/325 gives her relief but just for a short period of time.  In June of 2017, she was complaining of a catch under both breasts and some increased cough with occasional chest pain.  At that time her CT scan did show progressive collapse and consolidation of the left upper lobe and so a PET scan was done to evaluate this further.  This revealed mild hypermetabolism within the posterior left upper hemithorax consistent with radiation treatment changes and appeared relatively uniform with no areas of intense uptake suspicious for malignancy.  In December of 2017, she had a rib fracture that was found on x-ray at urgent care. She was also put on MS Contin 15mg  bid. She was seen by Dr. 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  She was placed on Protonix twice daily and had a follow-up upper endoscopy in July of 2018, which showed good healing of her ulcer, now consistent with an antral scar, and a small hiatal hernia. .  When I saw her in April, her hemoglobin was up to 11.3 but was back down to 9.1 on July 16th.  Biopsies revealed a reactive gastropathy and was negative for H pylori.  She is on iron supplement. She has had 2 myocardial infarctions, one in March of 2019 and another in June, and was transferred to [facility] in [place].  A chest x-ray done at that time showed just a chronic opacity at the left lung apex consistent with her treated lung cancer.  She also had an echocardiogram which revealed severe impairment of her left ventricular function with an ejection fraction of 30-35%.  She had akinesis of multiple areas of the myocardium.  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  It also revealed a small stable pericardial effusion.  ECHO from July 2021 revealed a good ejection fraction of 60-65% and small stable pericardial effusion.  CT chest from November 2021 revealed stable dense radiation fibrosis involving the left paramediastinal lung and left hilum with no findings suspicious for recurrent tumor.  Stable emphysematous changes and pulmonary scarring and stable 3 mm subpleural right lower lobe pulmonary nodule, likely  benign lymph node, was also seen.  Annual mammogram from December 2021 was negative.   CT chest from September 26, 2021 reveals stable dense radiation fibrosis involving the left upper lobe and left hilum. No findings suspicious for recurrent tumor. No mediastinal or hilar mass or adenopathy. Stable 3 mm subpleural pulmonary nodule in the right lower lobe. Stable advanced vascular calcifications. Stable small bilateral adrenal gland nodules,  likely benign adenomas.   INTERVAL HISTORY:  Ashley Miles is here for routine follow up. She states that she quit smoking back in November 2022. She remains short of breath with mild exertion.  Dr. Carren Rang has seen her and diagnosed sleep apnea, so she is now using a CPAP mask.  Cardiology has prescribed a diuretic for her.  She is on iron supplement and so her stools are black from this.  Blood counts and chemistries with her primary care physician were unremarkable except for a hemoglobin of 11.0 with an MCV of 92.  Her creatinine of 1.5 has improved to 1.22.  Labs here today reveal a hemoglobin of 11.3, down from 12.2 last time with an MCV of 90.  Her CEA remains normal at 2.1.  Her  appetite is good, and she has gained 7 pounds since her last visit.  She denies fever, chills or other signs of infection.  She denies nausea, vomiting, bowel issues, or abdominal pain.  She denies sore throat, cough, dyspnea, or chest pain.  REVIEW OF SYSTEMS:  Review of Systems  Constitutional: Negative.  Negative for appetite change, chills, fatigue, fever and unexpected weight change.  HENT:  Negative.    Eyes: Negative.   Respiratory:  Negative for chest tightness, cough, hemoptysis and wheezing. Shortness of breath: severe even with mild exertion.  Cardiovascular: Negative.  Negative for chest pain, leg swelling and palpitations.  Gastrointestinal: Negative.  Negative for abdominal distention, abdominal pain, blood in stool, constipation, diarrhea, nausea and vomiting.  Endocrine: Negative.   Genitourinary: Negative.  Negative for difficulty urinating, dysuria, frequency and hematuria.   Musculoskeletal:  Positive for gait problem (uses a walker to ambulate). Negative for arthralgias, back pain, flank pain and myalgias.  Skin: Negative.   Neurological:  Positive for gait problem (uses a walker to ambulate). Negative for dizziness, extremity weakness, headaches, light-headedness, numbness, seizures and speech  difficulty.  Hematological: Negative.   Psychiatric/Behavioral: Negative.  Negative for depression and sleep disturbance. The patient is not nervous/anxious.      VITALS:  Blood pressure 129/61, pulse 79, temperature 98.3 F (36.8 C), temperature source Oral, resp. rate 20, height 5\' 6"  (1.676 m), weight 177 lb (80.3 kg), SpO2 96 %.  Wt Readings from Last 3 Encounters:  03/27/22 177 lb (80.3 kg)  09/27/21 170 lb 3.2 oz (77.2 kg)  08/10/21 169 lb 6.4 oz (76.8 kg)    Body mass index is 28.57 kg/m.  Performance status (ECOG): 1 - Symptomatic but completely ambulatory  PHYSICAL EXAM:  Physical Exam Constitutional:      General: She is not in acute distress.    Appearance: Normal appearance. She is normal weight.  HENT:     Head: Normocephalic and atraumatic.  Eyes:     General: No scleral icterus.    Extraocular Movements: Extraocular movements intact.     Conjunctiva/sclera: Conjunctivae normal.     Pupils: Pupils are equal, round, and reactive to light.  Cardiovascular:     Rate and Rhythm: Normal rate and regular rhythm.     Pulses: Normal pulses.  Heart sounds: Normal heart sounds. No murmur heard.    No friction rub. No gallop.  Pulmonary:     Effort: Pulmonary effort is normal. No respiratory distress.     Breath sounds: Normal breath sounds.  Abdominal:     General: Bowel sounds are normal. There is no distension.     Palpations: Abdomen is soft. There is no hepatomegaly, splenomegaly or mass.     Tenderness: There is no abdominal tenderness.  Musculoskeletal:        General: Normal range of motion.     Cervical back: Normal range of motion and neck supple.     Right lower leg: No edema.     Left lower leg: No edema.  Lymphadenopathy:     Cervical: No cervical adenopathy.  Skin:    General: Skin is warm and dry.  Neurological:     General: No focal deficit present.     Mental Status: She is alert and oriented to person, place, and time. Mental status is at  baseline.  Psychiatric:        Mood and Affect: Mood normal.        Behavior: Behavior normal.        Thought Content: Thought content normal.        Judgment: Judgment normal.     LABS:      Latest Ref Rng & Units 03/27/2022   12:00 AM 09/24/2021   12:00 AM 03/15/2021   12:00 AM  CBC  WBC  8.0     9.6  8.7   Hemoglobin 12.0 - 16.0 11.3     12.2  12.8   Hematocrit 36 - 46 34     36  39   Platelets 150 - 400 K/uL 288     285  310      This result is from an external source.      Latest Ref Rng & Units 09/24/2021   12:00 AM 03/15/2021   12:00 AM 08/06/2020    5:57 PM  CMP  Glucose 70 - 99 mg/dL   165   BUN 4 - 21 19  13  13    Creatinine 0.5 - 1.1 1.5  1.3  1.42   Sodium 137 - 147 140  138  141   Potassium 3.4 - 5.3 3.8  4.3  3.9   Chloride 99 - 108 104  109  105   CO2 13 - 22 19  18  22    Calcium 8.7 - 10.7 9.3  9.3  10.0   Total Protein 6.5 - 8.1 g/dL   7.6   Total Bilirubin 0.3 - 1.2 mg/dL   0.6   Alkaline Phos 25 - 125 64  57  50   AST 13 - 35 17  23  22    ALT 7 - 35 9  11  11       Lab Results  Component Value Date   CEA1 2.1 03/27/2022   /  CEA  Date Value Ref Range Status  03/27/2022 2.1 0.0 - 4.7 ng/mL Final    Comment:    (NOTE)                             Nonsmokers          <3.9  Smokers             <5.6 [employer] Electrochemiluminescence Immunoassay (ECLIA) Values obtained with different assay methods or kits cannot be used interchangeably.  Results cannot be interpreted as absolute evidence of the presence or absence of malignant disease. Performed At: [facility] [place], [place] [id number] Rush Farmer MD GU:[id number]      STUDIES:    EXAM: 09/26/2021 CT CHEST WITH CONTRAST   TECHNIQUE:  Multidetector CT imaging of the chest was performed during  intravenous contrast administration.   RADIATION DOSE REDUCTION: This exam was performed according to the  departmental  dose-optimization program which includes automated  exposure control, adjustment of the mA and/or kV according to  patient size and/or use of iterative reconstruction technique.   CONTRAST: 80 cc Isovue 370   COMPARISON: CT scan 06/26/2020   FINDINGS:  Cardiovascular: The heart is normal in size. Small residual  pericardial effusion. The aorta demonstrates stable tortuosity and  calcification but no focal aneurysm or dissection. Stable  three-vessel coronary artery calcifications.  Mediastinum/Nodes: No mediastinal or hilar mass or lymphadenopathy.  Small scattered lymph nodes are unchanged. The esophagus is grossly  normal.  Lungs/Pleura: Stable dense radiation fibrosis involving the left  upper lobe and left hilum. No findings suspicious for recurrent  tumor. No findings suspicious for pulmonary metastatic disease.  Stable subpleural 3 mm pulmonary nodule in the right lower lobe on  image number [id number]. No pleural effusions or pleural nodules. Stable  slightly prominent pleural fat involving the right upper hemithorax.  Upper Abdomen: Stable small bilateral adrenal gland nodules, likely  benign adenomas. No worrisome hepatic lesions or upper abdominal  adenopathy. Stable advanced vascular calcifications.  Musculoskeletal: No breast masses, supraclavicular or axillary  adenopathy. Thyroid gland is unremarkable. The bony thorax is  intact.   IMPRESSION:  1. Stable dense radiation fibrosis involving the left upper lobe and  left hilum. No findings suspicious for recurrent tumor.  2. No mediastinal or hilar mass or adenopathy.  3. Stable 3 mm subpleural pulmonary nodule in the right lower lobe.  4. Stable advanced vascular calcifications.  5. Stable small bilateral adrenal gland nodules, likely benign  adenomas.   Allergies: No Known Allergies  Current Medications: Current Outpatient Medications  Medication Sig Dispense Refill   albuterol (PROVENTIL) (2.5 MG/3ML) 0.083%  nebulizer solution SMARTSIG:1 Vial(s) Via Nebulizer Every 4-6 Hours PRN     albuterol (VENTOLIN HFA) 108 (90 Base) MCG/ACT inhaler Inhale 2 puffs into the lungs as needed for shortness of breath.     buPROPion (WELLBUTRIN SR) 150 MG 12 hr tablet Take 150 mg by mouth 2 (two) times daily.     busPIRone (BUSPAR) 15 MG tablet Take 15 mg by mouth daily.     clopidogrel (PLAVIX) 75 MG tablet Take 1 tablet (75 mg total) by mouth daily. 30 tablet 6   dicyclomine (BENTYL) 10 MG capsule Take 10 mg by mouth every 6 (six) hours.     doxepin (SINEQUAN) 10 MG capsule Take 10 mg by mouth daily.     doxylamine, Sleep, (UNISOM) 25 MG tablet Take 25 mg by mouth at bedtime as needed for sleep.     ezetimibe (ZETIA) 10 MG tablet Take 1 tablet (10 mg total) by mouth daily. 30 tablet 6   Ferrous Sulfate (FEROSUL PO) Take 325 mg by mouth daily.     furosemide (LASIX) 20 MG tablet Take 0.5 tablets (10 mg total) by mouth daily. 45 tablet 3  ipratropium-albuterol (DUONEB) 0.5-2.5 (3) MG/3ML SOLN Take 3 mLs by nebulization every 6 (six) hours as needed.     lubiprostone (AMITIZA) 24 MCG capsule Take 24 mcg by mouth 2 (two) times daily with a meal.     metoprolol succinate (TOPROL-XL) 25 MG 24 hr tablet TAKE 1 TABLET BY MOUTH ONCE DAILY. 30 tablet 0   metoprolol succinate (TOPROL-XL) 25 MG 24 hr tablet Take 25 mg by mouth daily.     morphine (MS CONTIN) 15 MG 12 hr tablet Take 1 tablet (15 mg total) by mouth every 12 (twelve) hours. 60 tablet 0   naloxegol oxalate (MOVANTIK) 25 MG TABS tablet Take 25 mg by mouth daily.     nitroGLYCERIN (NITROSTAT) 0.4 MG SL tablet Place 1 tablet (0.4 mg total) under the tongue every 5 (five) minutes x 3 doses as needed for chest pain. 25 tablet 12   ondansetron (ZOFRAN ODT) 4 MG disintegrating tablet 4mg  ODT q4 hours prn nausea/vomit 8 tablet 0   ondansetron (ZOFRAN) 4 MG tablet Take 4 mg by mouth every 8 (eight) hours as needed.     oxyCODONE-acetaminophen (PERCOCET) 10-325 MG tablet  Take 1 tablet by mouth every 6 (six) hours as needed. for pain 120 tablet 0   pantoprazole (PROTONIX) 40 MG tablet Take 40 mg by mouth 2 (two) times daily.     polyethylene glycol (MIRALAX / GLYCOLAX) packet Take 17 g by mouth daily.     potassium chloride (K-DUR) 10 MEQ tablet Take 10 mEq by mouth daily.     prochlorperazine (COMPAZINE) 10 MG tablet Take 10 mg by mouth 3 (three) times daily as needed for nausea/vomiting, nausea or vomiting.     rosuvastatin (CRESTOR) 10 MG tablet Take 1 tablet by mouth daily.     tiZANidine (ZANAFLEX) 4 MG tablet Take 4 mg by mouth at bedtime as needed for muscle spasms.     TRELEGY ELLIPTA 200-62.5-25 MCG/ACT AEPB Inhale 1 puff into the lungs daily.     Vitamin D, Ergocalciferol, (DRISDOL) 50000 units CAPS capsule Take 50,000 Units by mouth every 7 (seven) days.     No current facility-administered medications for this visit.     ASSESSMENT & PLAN:   Assessment:   1.  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  This was treated with chemotherapy and radiation.  She remains without evidence of disease for over 9 years.    2.  Tiny stable pulmonary nodules.  3.  Chronic back pain from severe degenerative disc disease and osteoarthritis.  She continues oxycodone 10/325 with fairly good control of her pain.  4.  Stable small to moderate pericardial effusion.  ECHO from July 2021 revealed an EF between 60-65%.  Her cardiologist has ordered a diuretic.  5.  COPD with severe dyspnea. She finally quit smoking in November 2022.  Dr. Alcide Clever has diagnosed sleep apnea and she now uses a CPAP mask.  6.  Anemia.  This is mild and normochromic, normocytic.  She has been on iron supplement and has a previous history of peptic ulcer disease.  I will check her iron studies to see if she is absorbing the iron properly and also check B12 and folate levels.  I will contact her with those results.  Plan: Her port was flushed today.  I  have drawn an anemia evaluation and we will call her on those lab results.  Otherwise, we will see her back in 6 months with CBC,  comprehensive metabolic profile, CEA and CT of the chest for examination and port flush.  She understands and agrees with this plan of care.   I provided 25 minutes of face-to-face time during this this encounter and > 50% was spent counseling as documented under my assessment and plan.    Derwood Kaplan, MD [facility] AT [facility] [address] [place] [address] Dept: [phone] Dept Fax: [phone]

## 2022-03-28 DIAGNOSIS — G4733 Obstructive sleep apnea (adult) (pediatric): Secondary | ICD-10-CM | POA: Diagnosis not present

## 2022-03-28 DIAGNOSIS — Z87891 Personal history of nicotine dependence: Secondary | ICD-10-CM | POA: Diagnosis not present

## 2022-03-28 DIAGNOSIS — J4550 Severe persistent asthma, uncomplicated: Secondary | ICD-10-CM | POA: Diagnosis not present

## 2022-03-28 DIAGNOSIS — J700 Acute pulmonary manifestations due to radiation: Secondary | ICD-10-CM | POA: Diagnosis not present

## 2022-03-28 LAB — CEA: CEA: 2.1 ng/mL (ref 0.0–4.7)

## 2022-04-02 ENCOUNTER — Other Ambulatory Visit: Payer: Self-pay

## 2022-04-02 DIAGNOSIS — M479 Spondylosis, unspecified: Secondary | ICD-10-CM

## 2022-04-02 DIAGNOSIS — Z8739 Personal history of other diseases of the musculoskeletal system and connective tissue: Secondary | ICD-10-CM

## 2022-04-02 DIAGNOSIS — G8929 Other chronic pain: Secondary | ICD-10-CM

## 2022-04-02 MED ORDER — OXYCODONE-ACETAMINOPHEN 10-325 MG PO TABS: 1.0000 | ORAL_TABLET | Freq: Four times a day (QID) | ORAL | 0 refills | Status: DC | PRN

## 2022-04-02 MED ORDER — MORPHINE SULFATE ER 15 MG PO TBCR: 15.0000 mg | EXTENDED_RELEASE_TABLET | Freq: Two times a day (BID) | ORAL | 0 refills | Status: DC

## 2022-04-07 DIAGNOSIS — J439 Emphysema, unspecified: Secondary | ICD-10-CM | POA: Diagnosis not present

## 2022-04-08 DIAGNOSIS — G4733 Obstructive sleep apnea (adult) (pediatric): Secondary | ICD-10-CM | POA: Diagnosis not present

## 2022-04-17 DIAGNOSIS — J449 Chronic obstructive pulmonary disease, unspecified: Secondary | ICD-10-CM | POA: Diagnosis not present

## 2022-04-17 DIAGNOSIS — I491 Atrial premature depolarization: Secondary | ICD-10-CM | POA: Diagnosis not present

## 2022-04-17 DIAGNOSIS — S32010D Wedge compression fracture of first lumbar vertebra, subsequent encounter for fracture with routine healing: Secondary | ICD-10-CM | POA: Diagnosis not present

## 2022-04-17 DIAGNOSIS — I130 Hypertensive heart and chronic kidney disease with heart failure and stage 1 through stage 4 chronic kidney disease, or unspecified chronic kidney disease: Secondary | ICD-10-CM | POA: Diagnosis not present

## 2022-04-17 DIAGNOSIS — N181 Chronic kidney disease, stage 1: Secondary | ICD-10-CM | POA: Diagnosis not present

## 2022-04-17 DIAGNOSIS — R0902 Hypoxemia: Secondary | ICD-10-CM | POA: Diagnosis not present

## 2022-04-17 DIAGNOSIS — I4581 Long QT syndrome: Secondary | ICD-10-CM | POA: Diagnosis not present

## 2022-04-17 DIAGNOSIS — R531 Weakness: Secondary | ICD-10-CM | POA: Diagnosis not present

## 2022-04-17 DIAGNOSIS — Z8673 Personal history of transient ischemic attack (TIA), and cerebral infarction without residual deficits: Secondary | ICD-10-CM | POA: Diagnosis not present

## 2022-04-17 DIAGNOSIS — J432 Centrilobular emphysema: Secondary | ICD-10-CM | POA: Diagnosis not present

## 2022-04-17 DIAGNOSIS — D631 Anemia in chronic kidney disease: Secondary | ICD-10-CM | POA: Diagnosis not present

## 2022-04-17 DIAGNOSIS — M545 Low back pain, unspecified: Secondary | ICD-10-CM | POA: Diagnosis not present

## 2022-04-17 DIAGNOSIS — I255 Ischemic cardiomyopathy: Secondary | ICD-10-CM | POA: Diagnosis not present

## 2022-04-17 DIAGNOSIS — I21A1 Myocardial infarction type 2: Secondary | ICD-10-CM | POA: Diagnosis not present

## 2022-04-17 DIAGNOSIS — Z923 Personal history of irradiation: Secondary | ICD-10-CM | POA: Diagnosis not present

## 2022-04-17 DIAGNOSIS — I161 Hypertensive emergency: Secondary | ICD-10-CM | POA: Diagnosis not present

## 2022-04-17 DIAGNOSIS — Z85118 Personal history of other malignant neoplasm of bronchus and lung: Secondary | ICD-10-CM | POA: Diagnosis not present

## 2022-04-17 DIAGNOSIS — I10 Essential (primary) hypertension: Secondary | ICD-10-CM | POA: Diagnosis not present

## 2022-04-17 DIAGNOSIS — R6889 Other general symptoms and signs: Secondary | ICD-10-CM | POA: Diagnosis not present

## 2022-04-17 DIAGNOSIS — G8929 Other chronic pain: Secondary | ICD-10-CM | POA: Diagnosis not present

## 2022-04-17 DIAGNOSIS — K8020 Calculus of gallbladder without cholecystitis without obstruction: Secondary | ICD-10-CM | POA: Diagnosis not present

## 2022-04-17 DIAGNOSIS — E7800 Pure hypercholesterolemia, unspecified: Secondary | ICD-10-CM | POA: Diagnosis not present

## 2022-04-17 DIAGNOSIS — Z87891 Personal history of nicotine dependence: Secondary | ICD-10-CM | POA: Diagnosis not present

## 2022-04-17 DIAGNOSIS — I3139 Other pericardial effusion (noninflammatory): Secondary | ICD-10-CM | POA: Diagnosis not present

## 2022-04-17 DIAGNOSIS — S32010A Wedge compression fracture of first lumbar vertebra, initial encounter for closed fracture: Secondary | ICD-10-CM | POA: Diagnosis not present

## 2022-04-17 DIAGNOSIS — I723 Aneurysm of iliac artery: Secondary | ICD-10-CM | POA: Diagnosis not present

## 2022-04-17 DIAGNOSIS — Z7401 Bed confinement status: Secondary | ICD-10-CM | POA: Diagnosis not present

## 2022-04-17 DIAGNOSIS — N179 Acute kidney failure, unspecified: Secondary | ICD-10-CM | POA: Diagnosis not present

## 2022-04-17 DIAGNOSIS — I252 Old myocardial infarction: Secondary | ICD-10-CM | POA: Diagnosis not present

## 2022-04-17 DIAGNOSIS — M1990 Unspecified osteoarthritis, unspecified site: Secondary | ICD-10-CM | POA: Diagnosis not present

## 2022-04-17 DIAGNOSIS — R0602 Shortness of breath: Secondary | ICD-10-CM | POA: Diagnosis not present

## 2022-04-17 DIAGNOSIS — Z9221 Personal history of antineoplastic chemotherapy: Secondary | ICD-10-CM | POA: Diagnosis not present

## 2022-04-17 DIAGNOSIS — Z87442 Personal history of urinary calculi: Secondary | ICD-10-CM | POA: Diagnosis not present

## 2022-04-17 DIAGNOSIS — K219 Gastro-esophageal reflux disease without esophagitis: Secondary | ICD-10-CM | POA: Diagnosis not present

## 2022-04-17 DIAGNOSIS — I5021 Acute systolic (congestive) heart failure: Secondary | ICD-10-CM | POA: Diagnosis not present

## 2022-04-17 DIAGNOSIS — R079 Chest pain, unspecified: Secondary | ICD-10-CM | POA: Diagnosis not present

## 2022-04-17 DIAGNOSIS — I2510 Atherosclerotic heart disease of native coronary artery without angina pectoris: Secondary | ICD-10-CM | POA: Diagnosis not present

## 2022-04-17 DIAGNOSIS — I5033 Acute on chronic diastolic (congestive) heart failure: Secondary | ICD-10-CM | POA: Diagnosis not present

## 2022-04-17 DIAGNOSIS — I701 Atherosclerosis of renal artery: Secondary | ICD-10-CM | POA: Diagnosis not present

## 2022-04-19 DIAGNOSIS — I491 Atrial premature depolarization: Secondary | ICD-10-CM | POA: Diagnosis not present

## 2022-04-21 ENCOUNTER — Encounter: Payer: Self-pay | Admitting: Hematology and Oncology

## 2022-04-21 ENCOUNTER — Other Ambulatory Visit: Payer: Self-pay | Admitting: Oncology

## 2022-04-21 DIAGNOSIS — M6250 Muscle wasting and atrophy, not elsewhere classified, unspecified site: Secondary | ICD-10-CM

## 2022-04-21 DIAGNOSIS — R1311 Dysphagia, oral phase: Secondary | ICD-10-CM | POA: Insufficient documentation

## 2022-04-21 DIAGNOSIS — D529 Folate deficiency anemia, unspecified: Secondary | ICD-10-CM | POA: Insufficient documentation

## 2022-04-21 HISTORY — DX: Dysphagia, oral phase: R13.11

## 2022-04-21 HISTORY — DX: Folate deficiency anemia, unspecified: D52.9

## 2022-04-21 HISTORY — DX: Muscle wasting and atrophy, not elsewhere classified, unspecified site: M62.50

## 2022-04-21 MED ORDER — FOLIC ACID 1 MG PO TABS: 1.0000 mg | ORAL_TABLET | Freq: Every day | ORAL | 5 refills | Status: DC

## 2022-04-22 DIAGNOSIS — I130 Hypertensive heart and chronic kidney disease with heart failure and stage 1 through stage 4 chronic kidney disease, or unspecified chronic kidney disease: Secondary | ICD-10-CM | POA: Diagnosis not present

## 2022-04-22 DIAGNOSIS — R531 Weakness: Secondary | ICD-10-CM | POA: Diagnosis not present

## 2022-04-22 DIAGNOSIS — S32010A Wedge compression fracture of first lumbar vertebra, initial encounter for closed fracture: Secondary | ICD-10-CM | POA: Diagnosis not present

## 2022-04-22 DIAGNOSIS — J449 Chronic obstructive pulmonary disease, unspecified: Secondary | ICD-10-CM | POA: Diagnosis not present

## 2022-04-22 DIAGNOSIS — S32010D Wedge compression fracture of first lumbar vertebra, subsequent encounter for fracture with routine healing: Secondary | ICD-10-CM | POA: Diagnosis not present

## 2022-04-22 DIAGNOSIS — R262 Difficulty in walking, not elsewhere classified: Secondary | ICD-10-CM | POA: Diagnosis not present

## 2022-04-22 DIAGNOSIS — I5022 Chronic systolic (congestive) heart failure: Secondary | ICD-10-CM | POA: Diagnosis not present

## 2022-04-22 DIAGNOSIS — Z7401 Bed confinement status: Secondary | ICD-10-CM | POA: Diagnosis not present

## 2022-04-22 DIAGNOSIS — I161 Hypertensive emergency: Secondary | ICD-10-CM | POA: Diagnosis not present

## 2022-04-22 DIAGNOSIS — I5033 Acute on chronic diastolic (congestive) heart failure: Secondary | ICD-10-CM | POA: Diagnosis not present

## 2022-04-22 DIAGNOSIS — R41841 Cognitive communication deficit: Secondary | ICD-10-CM

## 2022-04-22 DIAGNOSIS — I21A1 Myocardial infarction type 2: Secondary | ICD-10-CM | POA: Diagnosis not present

## 2022-04-22 DIAGNOSIS — R6889 Other general symptoms and signs: Secondary | ICD-10-CM | POA: Diagnosis not present

## 2022-04-22 HISTORY — DX: Cognitive communication deficit: R41.841

## 2022-04-23 ENCOUNTER — Telehealth: Payer: Self-pay

## 2022-04-23 DIAGNOSIS — R262 Difficulty in walking, not elsewhere classified: Secondary | ICD-10-CM | POA: Diagnosis not present

## 2022-04-23 DIAGNOSIS — K551 Chronic vascular disorders of intestine: Secondary | ICD-10-CM | POA: Insufficient documentation

## 2022-04-23 DIAGNOSIS — I771 Stricture of artery: Secondary | ICD-10-CM

## 2022-04-23 DIAGNOSIS — S32010A Wedge compression fracture of first lumbar vertebra, initial encounter for closed fracture: Secondary | ICD-10-CM | POA: Diagnosis not present

## 2022-04-23 DIAGNOSIS — I5022 Chronic systolic (congestive) heart failure: Secondary | ICD-10-CM | POA: Diagnosis not present

## 2022-04-23 DIAGNOSIS — J449 Chronic obstructive pulmonary disease, unspecified: Secondary | ICD-10-CM | POA: Diagnosis not present

## 2022-04-23 HISTORY — DX: Chronic vascular disorders of intestine: K55.1

## 2022-04-23 HISTORY — DX: Stricture of artery: I77.1

## 2022-04-23 NOTE — Telephone Encounter (Signed)
-----   Message from Belva Chimes, LPN sent at 1/48/4039 11:29 AM EDT ----- Regarding: FW: call  ----- Message ----- From: Derwood Kaplan, MD Sent: 04/21/2022   2:44 PM EDT To: Belva Chimes, LPN Subject: call                                           Tell her B12 and iron levels are good but folate is low. I recommend 1 daily and will send in Rx. Her CEA (cancer test ) remains normal.

## 2022-04-23 NOTE — Telephone Encounter (Signed)
Called and spoke with patient's daughter Maudie Mercury to advise her of her mother's lab results. I advised that we would be sending in a folate script to her her pharmacy. She has advised me that her mother is currently doing physical therapy at [facility]. I have added the pharmacy they use, [facility] for script to be sent in

## 2022-04-29 ENCOUNTER — Encounter: Payer: Self-pay | Admitting: Hematology and Oncology

## 2022-04-29 DIAGNOSIS — I5022 Chronic systolic (congestive) heart failure: Secondary | ICD-10-CM | POA: Diagnosis not present

## 2022-04-29 DIAGNOSIS — R262 Difficulty in walking, not elsewhere classified: Secondary | ICD-10-CM | POA: Diagnosis not present

## 2022-04-29 DIAGNOSIS — S32010A Wedge compression fracture of first lumbar vertebra, initial encounter for closed fracture: Secondary | ICD-10-CM | POA: Diagnosis not present

## 2022-04-29 DIAGNOSIS — J449 Chronic obstructive pulmonary disease, unspecified: Secondary | ICD-10-CM | POA: Diagnosis not present

## 2022-05-03 ENCOUNTER — Other Ambulatory Visit: Payer: Self-pay | Admitting: Hematology and Oncology

## 2022-05-03 DIAGNOSIS — Z8739 Personal history of other diseases of the musculoskeletal system and connective tissue: Secondary | ICD-10-CM

## 2022-05-03 DIAGNOSIS — I252 Old myocardial infarction: Secondary | ICD-10-CM | POA: Diagnosis not present

## 2022-05-03 DIAGNOSIS — Z7902 Long term (current) use of antithrombotics/antiplatelets: Secondary | ICD-10-CM | POA: Insufficient documentation

## 2022-05-03 DIAGNOSIS — I5022 Chronic systolic (congestive) heart failure: Secondary | ICD-10-CM | POA: Diagnosis not present

## 2022-05-03 DIAGNOSIS — M479 Spondylosis, unspecified: Secondary | ICD-10-CM

## 2022-05-03 DIAGNOSIS — J449 Chronic obstructive pulmonary disease, unspecified: Secondary | ICD-10-CM | POA: Diagnosis not present

## 2022-05-03 DIAGNOSIS — S32010D Wedge compression fracture of first lumbar vertebra, subsequent encounter for fracture with routine healing: Secondary | ICD-10-CM | POA: Diagnosis not present

## 2022-05-03 DIAGNOSIS — I110 Hypertensive heart disease with heart failure: Secondary | ICD-10-CM | POA: Diagnosis not present

## 2022-05-03 DIAGNOSIS — K551 Chronic vascular disorders of intestine: Secondary | ICD-10-CM | POA: Diagnosis not present

## 2022-05-03 DIAGNOSIS — R1311 Dysphagia, oral phase: Secondary | ICD-10-CM | POA: Diagnosis not present

## 2022-05-03 DIAGNOSIS — M6250 Muscle wasting and atrophy, not elsewhere classified, unspecified site: Secondary | ICD-10-CM | POA: Diagnosis not present

## 2022-05-03 DIAGNOSIS — G8929 Other chronic pain: Secondary | ICD-10-CM

## 2022-05-03 DIAGNOSIS — I771 Stricture of artery: Secondary | ICD-10-CM | POA: Diagnosis not present

## 2022-05-03 HISTORY — DX: Long term (current) use of antithrombotics/antiplatelets: Z79.02

## 2022-05-03 HISTORY — DX: Wedge compression fracture of first lumbar vertebra, subsequent encounter for fracture with routine healing: S32.010D

## 2022-05-03 MED ORDER — OXYCODONE-ACETAMINOPHEN 10-325 MG PO TABS: 1.0000 | ORAL_TABLET | Freq: Four times a day (QID) | ORAL | 0 refills | Status: DC | PRN

## 2022-05-06 DIAGNOSIS — I252 Old myocardial infarction: Secondary | ICD-10-CM | POA: Diagnosis not present

## 2022-05-06 DIAGNOSIS — I110 Hypertensive heart disease with heart failure: Secondary | ICD-10-CM | POA: Diagnosis not present

## 2022-05-06 DIAGNOSIS — I5022 Chronic systolic (congestive) heart failure: Secondary | ICD-10-CM | POA: Diagnosis not present

## 2022-05-06 DIAGNOSIS — M6250 Muscle wasting and atrophy, not elsewhere classified, unspecified site: Secondary | ICD-10-CM | POA: Diagnosis not present

## 2022-05-06 DIAGNOSIS — J449 Chronic obstructive pulmonary disease, unspecified: Secondary | ICD-10-CM | POA: Diagnosis not present

## 2022-05-06 DIAGNOSIS — K551 Chronic vascular disorders of intestine: Secondary | ICD-10-CM | POA: Diagnosis not present

## 2022-05-06 DIAGNOSIS — R1311 Dysphagia, oral phase: Secondary | ICD-10-CM | POA: Diagnosis not present

## 2022-05-06 DIAGNOSIS — S32010D Wedge compression fracture of first lumbar vertebra, subsequent encounter for fracture with routine healing: Secondary | ICD-10-CM | POA: Diagnosis not present

## 2022-05-06 DIAGNOSIS — I771 Stricture of artery: Secondary | ICD-10-CM | POA: Diagnosis not present

## 2022-05-07 DIAGNOSIS — J439 Emphysema, unspecified: Secondary | ICD-10-CM | POA: Diagnosis not present

## 2022-05-08 DIAGNOSIS — G4733 Obstructive sleep apnea (adult) (pediatric): Secondary | ICD-10-CM | POA: Diagnosis not present

## 2022-05-10 ENCOUNTER — Other Ambulatory Visit: Payer: Self-pay | Admitting: *Deleted

## 2022-05-10 DIAGNOSIS — I701 Atherosclerosis of renal artery: Secondary | ICD-10-CM

## 2022-05-14 DIAGNOSIS — I771 Stricture of artery: Secondary | ICD-10-CM | POA: Diagnosis not present

## 2022-05-14 DIAGNOSIS — I110 Hypertensive heart disease with heart failure: Secondary | ICD-10-CM | POA: Diagnosis not present

## 2022-05-14 DIAGNOSIS — I252 Old myocardial infarction: Secondary | ICD-10-CM | POA: Diagnosis not present

## 2022-05-14 DIAGNOSIS — R1311 Dysphagia, oral phase: Secondary | ICD-10-CM | POA: Diagnosis not present

## 2022-05-14 DIAGNOSIS — J449 Chronic obstructive pulmonary disease, unspecified: Secondary | ICD-10-CM | POA: Diagnosis not present

## 2022-05-14 DIAGNOSIS — I5022 Chronic systolic (congestive) heart failure: Secondary | ICD-10-CM | POA: Diagnosis not present

## 2022-05-14 DIAGNOSIS — S32010D Wedge compression fracture of first lumbar vertebra, subsequent encounter for fracture with routine healing: Secondary | ICD-10-CM | POA: Diagnosis not present

## 2022-05-14 DIAGNOSIS — M6250 Muscle wasting and atrophy, not elsewhere classified, unspecified site: Secondary | ICD-10-CM | POA: Diagnosis not present

## 2022-05-14 DIAGNOSIS — K551 Chronic vascular disorders of intestine: Secondary | ICD-10-CM | POA: Diagnosis not present

## 2022-05-14 NOTE — Progress Notes (Deleted)
she has gained 7 pounds since her last visit.  She denies fever, chills or other signs of infection.  She denies nausea, vomiting, bowel issues, or abdominal pain.  She denies sore throat, cough, dyspnea, or chest pain   Nothing to do

## 2022-05-17 ENCOUNTER — Encounter: Payer: Medicare Other | Admitting: Vascular Surgery

## 2022-05-17 ENCOUNTER — Encounter (HOSPITAL_COMMUNITY): Payer: Medicare Other

## 2022-05-17 DIAGNOSIS — K551 Chronic vascular disorders of intestine: Secondary | ICD-10-CM | POA: Diagnosis not present

## 2022-05-17 DIAGNOSIS — I110 Hypertensive heart disease with heart failure: Secondary | ICD-10-CM | POA: Diagnosis not present

## 2022-05-17 DIAGNOSIS — I252 Old myocardial infarction: Secondary | ICD-10-CM | POA: Diagnosis not present

## 2022-05-17 DIAGNOSIS — I771 Stricture of artery: Secondary | ICD-10-CM | POA: Diagnosis not present

## 2022-05-17 DIAGNOSIS — M6250 Muscle wasting and atrophy, not elsewhere classified, unspecified site: Secondary | ICD-10-CM | POA: Diagnosis not present

## 2022-05-17 DIAGNOSIS — J449 Chronic obstructive pulmonary disease, unspecified: Secondary | ICD-10-CM | POA: Diagnosis not present

## 2022-05-17 DIAGNOSIS — R1311 Dysphagia, oral phase: Secondary | ICD-10-CM | POA: Diagnosis not present

## 2022-05-17 DIAGNOSIS — S32010D Wedge compression fracture of first lumbar vertebra, subsequent encounter for fracture with routine healing: Secondary | ICD-10-CM | POA: Diagnosis not present

## 2022-05-17 DIAGNOSIS — I5022 Chronic systolic (congestive) heart failure: Secondary | ICD-10-CM | POA: Diagnosis not present

## 2022-05-20 ENCOUNTER — Ambulatory Visit: Payer: Medicare Other | Admitting: Cardiology

## 2022-05-21 DIAGNOSIS — I5022 Chronic systolic (congestive) heart failure: Secondary | ICD-10-CM | POA: Diagnosis not present

## 2022-05-21 DIAGNOSIS — J449 Chronic obstructive pulmonary disease, unspecified: Secondary | ICD-10-CM | POA: Diagnosis not present

## 2022-05-21 DIAGNOSIS — I771 Stricture of artery: Secondary | ICD-10-CM | POA: Diagnosis not present

## 2022-05-21 DIAGNOSIS — R1311 Dysphagia, oral phase: Secondary | ICD-10-CM | POA: Diagnosis not present

## 2022-05-21 DIAGNOSIS — S32010D Wedge compression fracture of first lumbar vertebra, subsequent encounter for fracture with routine healing: Secondary | ICD-10-CM | POA: Diagnosis not present

## 2022-05-21 DIAGNOSIS — I110 Hypertensive heart disease with heart failure: Secondary | ICD-10-CM | POA: Diagnosis not present

## 2022-05-21 DIAGNOSIS — K551 Chronic vascular disorders of intestine: Secondary | ICD-10-CM | POA: Diagnosis not present

## 2022-05-21 DIAGNOSIS — M6250 Muscle wasting and atrophy, not elsewhere classified, unspecified site: Secondary | ICD-10-CM | POA: Diagnosis not present

## 2022-05-21 DIAGNOSIS — I252 Old myocardial infarction: Secondary | ICD-10-CM | POA: Diagnosis not present

## 2022-05-23 DIAGNOSIS — M6250 Muscle wasting and atrophy, not elsewhere classified, unspecified site: Secondary | ICD-10-CM | POA: Diagnosis not present

## 2022-05-23 DIAGNOSIS — I110 Hypertensive heart disease with heart failure: Secondary | ICD-10-CM | POA: Diagnosis not present

## 2022-05-23 DIAGNOSIS — I771 Stricture of artery: Secondary | ICD-10-CM | POA: Diagnosis not present

## 2022-05-23 DIAGNOSIS — K551 Chronic vascular disorders of intestine: Secondary | ICD-10-CM | POA: Diagnosis not present

## 2022-05-23 DIAGNOSIS — S32010D Wedge compression fracture of first lumbar vertebra, subsequent encounter for fracture with routine healing: Secondary | ICD-10-CM | POA: Diagnosis not present

## 2022-05-23 DIAGNOSIS — R1311 Dysphagia, oral phase: Secondary | ICD-10-CM | POA: Diagnosis not present

## 2022-05-23 DIAGNOSIS — J449 Chronic obstructive pulmonary disease, unspecified: Secondary | ICD-10-CM | POA: Diagnosis not present

## 2022-05-23 DIAGNOSIS — I252 Old myocardial infarction: Secondary | ICD-10-CM | POA: Diagnosis not present

## 2022-05-23 DIAGNOSIS — I5022 Chronic systolic (congestive) heart failure: Secondary | ICD-10-CM | POA: Diagnosis not present

## 2022-05-29 ENCOUNTER — Ambulatory Visit (HOSPITAL_COMMUNITY)
Admission: RE | Admit: 2022-05-29 | Discharge: 2022-05-29 | Disposition: A | Discharge status: Home / Self Care | Payer: Medicare Other | Source: Ambulatory Visit | Attending: Vascular Surgery | Admitting: Vascular Surgery

## 2022-05-29 ENCOUNTER — Encounter: Payer: Self-pay | Admitting: Vascular Surgery

## 2022-05-29 ENCOUNTER — Ambulatory Visit (INDEPENDENT_AMBULATORY_CARE_PROVIDER_SITE_OTHER): Payer: Medicare Other | Admitting: Vascular Surgery

## 2022-05-29 VITALS — BP 154/70 | HR 92 | Temp 97.9°F | Resp 20 | Ht 66.0 in | Wt 170.0 lb

## 2022-05-29 DIAGNOSIS — I701 Atherosclerosis of renal artery: Secondary | ICD-10-CM | POA: Diagnosis not present

## 2022-05-29 NOTE — Progress Notes (Signed)
Patient ID: Ashley Miles, female   DOB: [birth year], 76 y.o.   MRN: [id number]  Reason for Consult: No chief complaint on file.   Referred by Paralee Cancel,*  Subjective:     HPI:  Ashley Miles is a 76 y.o. female without significant vascular disease but does have history of coronary artery disease and congestive heart failure.  Recently was admitted in [facility] for over a week with back pain initially concern for dissection was found to have significant blockage of her paravesical arterial branches.  She states that she does have abdominal pain when she is constipated she is on significant pain medicine for back pain which causes constipation but she does not have any food fear or pain with eating has not had any recent weight loss.  She is a former smoker but quit 1 year ago.  She does have some underlying kidney disease but this has been stable according to her daughter.  Past Medical History:  Diagnosis Date   Acute blood loss anemia    Acute kidney failure (HCC)    Acute systolic heart failure (HCC)    Anemia, unspecified 07/11/2020   Anxiety    Arthritis    Cardiogenic shock (HCC)    CHF (congestive heart failure) ([place])    Chronic systolic heart failure ([facility]) 03/03/2017   Cardiac catheterization 2018 showed diagonal branch occluded and right coronary artery stenosis however right coronary artery was very small, the feeling was that degree of coronary artery disease is low to her ejection fraction.   COPD (chronic obstructive pulmonary disease) ([place])    Coronary artery disease 03/03/2017   Cardiac catheterization June 2018 showing 90% small RCA, completely occluded diagonal branch. Unsuccessful attempt to open up diagonal branch.   Drug-induced constipation    Dyslipidemia 04/03/2017   Gastric ulcer    GERD (gastroesophageal reflux disease)    Hiatal hernia    Hyperlipidemia    Hypertension    Ischemic cardiomyopathy 05/21/2018   Lung cancer ([place]) 03/03/2017    NSTEMI (non-ST elevated myocardial infarction) ([place]) 01/09/2017   Osteopenia    Small cell lung cancer ([place])    Smoking 11/20/2017   T12 compression fracture (HCC)    Tobacco use    Family History  Problem Relation Age of Onset   Hypertension Father        died pnuemonia   Cancer Father    Blindness Mother        died falling out of wheelchair   Colon cancer Neg Hx    Esophageal cancer Neg Hx    Past Surgical History:  Procedure Laterality Date   CESAREAN SECTION     ESOPHAGOGASTRODUODENOSCOPY  02/17/2017   Healing of the gastic ulcer- no antral scar. Small hiatal hernia   LEFT HEART CATH AND CORONARY ANGIOGRAPHY N/A 01/09/2017   Procedure: Left Heart Cath and Coronary Angiography;  Surgeon: Burnell Blanks, MD;  Location: [facility];  Service: Cardiovascular;  Laterality: N/A;   LUNG CANCER SURGERY  2014    Short Social History:  Social History   Tobacco Use   Smoking status: Former    Types: Cigarettes    Quit date: 06/05/2021    Years since quitting: 0.9   Smokeless tobacco: Never  Substance Use Topics   Alcohol use: No    No Known Allergies  Current Outpatient Medications  Medication Sig Dispense Refill   albuterol (PROVENTIL) (2.5 MG/3ML) 0.083% nebulizer solution SMARTSIG:1 Vial(s) Via Nebulizer Every 4-6 Hours  PRN     albuterol (VENTOLIN HFA) 108 (90 Base) MCG/ACT inhaler Inhale 2 puffs into the lungs as needed for shortness of breath.     buPROPion (WELLBUTRIN SR) 150 MG 12 hr tablet Take 150 mg by mouth 2 (two) times daily.     busPIRone (BUSPAR) 15 MG tablet Take 15 mg by mouth daily.     clopidogrel (PLAVIX) 75 MG tablet Take 1 tablet (75 mg total) by mouth daily. 30 tablet 6   dicyclomine (BENTYL) 10 MG capsule Take 10 mg by mouth every 6 (six) hours.     doxepin (SINEQUAN) 10 MG capsule Take 10 mg by mouth daily.     doxylamine, Sleep, (UNISOM) 25 MG tablet Take 25 mg by mouth at bedtime as needed for sleep.     ezetimibe (ZETIA) 10 MG tablet  Take 1 tablet (10 mg total) by mouth daily. 30 tablet 6   Ferrous Sulfate (FEROSUL PO) Take 325 mg by mouth daily.     folic acid (FOLVITE) 1 MG tablet Take 1 tablet (1 mg total) by mouth daily. 30 tablet 5   furosemide (LASIX) 20 MG tablet Take 0.5 tablets (10 mg total) by mouth daily. 45 tablet 3   ipratropium-albuterol (DUONEB) 0.5-2.5 (3) MG/3ML SOLN Take 3 mLs by nebulization every 6 (six) hours as needed.     lubiprostone (AMITIZA) 24 MCG capsule Take 24 mcg by mouth 2 (two) times daily with a meal.     metoprolol succinate (TOPROL-XL) 25 MG 24 hr tablet TAKE 1 TABLET BY MOUTH ONCE DAILY. 30 tablet 0   metoprolol succinate (TOPROL-XL) 25 MG 24 hr tablet Take 25 mg by mouth daily.     morphine (MS CONTIN) 15 MG 12 hr tablet TAKE ONE TABLET BY MOUTH EVERY 12 HOURS 60 tablet 0   naloxegol oxalate (MOVANTIK) 25 MG TABS tablet Take 25 mg by mouth daily.     nitroGLYCERIN (NITROSTAT) 0.4 MG SL tablet Place 1 tablet (0.4 mg total) under the tongue every 5 (five) minutes x 3 doses as needed for chest pain. 25 tablet 12   ondansetron (ZOFRAN ODT) 4 MG disintegrating tablet 4mg  ODT q4 hours prn nausea/vomit 8 tablet 0   ondansetron (ZOFRAN) 4 MG tablet Take 4 mg by mouth every 8 (eight) hours as needed.     oxyCODONE-acetaminophen (PERCOCET) 10-325 MG tablet Take 1 tablet by mouth every 6 (six) hours as needed. for pain 120 tablet 0   pantoprazole (PROTONIX) 40 MG tablet Take 40 mg by mouth 2 (two) times daily.     polyethylene glycol (MIRALAX / GLYCOLAX) packet Take 17 g by mouth daily.     potassium chloride (K-DUR) 10 MEQ tablet Take 10 mEq by mouth daily.     prochlorperazine (COMPAZINE) 10 MG tablet Take 10 mg by mouth 3 (three) times daily as needed for nausea/vomiting, nausea or vomiting.     rosuvastatin (CRESTOR) 10 MG tablet Take 1 tablet by mouth daily.     tiZANidine (ZANAFLEX) 4 MG tablet Take 4 mg by mouth at bedtime as needed for muscle spasms.     TRELEGY ELLIPTA 200-62.5-25 MCG/ACT  AEPB Inhale 1 puff into the lungs daily.     Vitamin D, Ergocalciferol, (DRISDOL) 50000 units CAPS capsule Take 50,000 Units by mouth every 7 (seven) days.     No current facility-administered medications for this visit.    Review of Systems  Constitutional:  Constitutional negative. HENT: HENT negative.  Eyes: Eyes negative.  Respiratory: Respiratory negative.  Cardiovascular: Cardiovascular negative.  GI: Gastrointestinal negative.  Musculoskeletal: Positive for back pain and gait problem.  Skin: Skin negative.  Hematologic: Hematologic/lymphatic negative.  Psychiatric: Psychiatric negative.        Objective:  Objective  Vitals:   05/29/22 0835  BP: (!) 154/70  Pulse: 92  Resp: 20  Temp: 97.9 F (36.6 C)  SpO2: 95%    Physical Exam HENT:     Head: Normocephalic.     Nose: Nose normal.     Mouth/Throat:     Mouth: Mucous membranes are moist.  Eyes:     Pupils: Pupils are equal, round, and reactive to light.  Neck:     Vascular: No carotid bruit.  Cardiovascular:     Rate and Rhythm: Normal rate.     Heart sounds: Normal heart sounds.  Pulmonary:     Effort: Pulmonary effort is normal.  Abdominal:     General: Abdomen is flat.     Palpations: Abdomen is soft.  Musculoskeletal:     Cervical back: Neck supple.     Right lower leg: No edema.     Left lower leg: No edema.  Skin:    General: Skin is warm.     Capillary Refill: Capillary refill takes less than 2 seconds.  Neurological:     Mental Status: She is alert.  Psychiatric:        Mood and Affect: Mood normal.        Thought Content: Thought content normal.     Data:  Duplex Findings:  +--------------------+--------+--------+------+--------+  Mesenteric          PSV cm/sEDV cm/sPlaqueComments  +--------------------+--------+--------+------+--------+  Aorta Prox             59      16                   +--------------------+--------+--------+------+--------+  Aorta Mid               57      12                   +--------------------+--------+--------+------+--------+  Celiac Artery Origin  153                           +--------------------+--------+--------+------+--------+  SMA Proximal          234      43                   +--------------------+--------+--------+------+--------+  SMA Mid               123      35                   +--------------------+--------+--------+------+--------+              +------------------+--------+--------+-------+  Right Renal ArteryPSV cm/sEDV cm/sComment  +------------------+--------+--------+-------+  Proximal             84      14            +------------------+--------+--------+-------+  Mid                 107      13            +------------------+--------+--------+-------+  Distal               76      16            +------------------+--------+--------+-------+   +-----------------+--------+--------+-------------+  Left Renal ArteryPSV cm/sEDV cm/s   Comment     +-----------------+--------+--------+-------------+  Proximal           285      29   >60% stenosis  +-----------------+--------+--------+-------------+  Mid                126      26                  +-----------------+--------+--------+-------------+  Distal              90      17                  +-----------------+--------+--------+-------------+   +------------+--------+--------+--+-----------+--------+--------+---+  Right KidneyPSV cm/sEDV cm/sRILeft KidneyPSV cm/sEDV cm/sRI   +------------+--------+--------+--+-----------+--------+--------+---+  Upper Pole                    Upper Pole                      +------------+--------+--------+--+-----------+--------+--------+---+  Mid                           Mid                             +------------+--------+--------+--+-----------+--------+--------+---+  Lower Pole                    Lower Pole                       +------------+--------+--------+--+-----------+--------+--------+---+  Hilar                         Hilar                           +------------+--------+--------+--+-----------+--------+--------+---+   +------------------+----+------------------+-------+  Right Kidney          Left Kidney                +------------------+----+------------------+-------+  RAR                   RAR                        +------------------+----+------------------+-------+  RAR (manual)      1.8 RAR (manual)      5        +------------------+----+------------------+-------+  Cortex                Cortex                     +------------------+----+------------------+-------+  Cortex thickness      Corex thickness   0.25 mm  +------------------+----+------------------+-------+  Kidney length (cm)9.04Kidney length (cm)7.77     +------------------+----+------------------+-------+      Summary:  Renal:     Right: Normal size right kidney. RRV flow present. No evidence of         right renal artery stenosis.  Left:  Abnormal size for the left kidney. Abnormal cortical         thickness of the left kidney. Evidence of a > 60% stenosis in         the left renal artery. LRV flow present.  Mesenteric:  Normal Celiac artery and Superior Mesenteric artery findings.  CT IMPRESSION: 1. Aortic atherosclerosis without evidence of aortic dissection. 2. 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 3. Compression deformity in the superior endplate at L1 which is new from the previous exam. A stable compression deformity is noted in the superior endplate at E31. 4. Cardiomegaly with small pericardial effusion and coronary artery calcifications.. 5. Stable scarring and post radiation changes in the medial aspect of the left lung. 6. Emphysema. 7. Cholelithiasis.      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  These all appear asymptomatic and although there is stenosis of the left renal artery the kidney does appear diminutive in size but by duplex and CT scan.  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  Plan will be to follow-up in 1 year with repeat mesenteric and renal duplexes.  All questions were answered in the presence of the patient's daughter and they both demonstrate good understanding.     Waynetta Sandy MD [facility]

## 2022-05-30 ENCOUNTER — Other Ambulatory Visit: Payer: Self-pay | Admitting: Hematology and Oncology

## 2022-05-30 ENCOUNTER — Other Ambulatory Visit: Payer: Self-pay

## 2022-05-30 DIAGNOSIS — Z8739 Personal history of other diseases of the musculoskeletal system and connective tissue: Secondary | ICD-10-CM

## 2022-05-30 DIAGNOSIS — I252 Old myocardial infarction: Secondary | ICD-10-CM | POA: Diagnosis not present

## 2022-05-30 DIAGNOSIS — J449 Chronic obstructive pulmonary disease, unspecified: Secondary | ICD-10-CM | POA: Diagnosis not present

## 2022-05-30 DIAGNOSIS — Z87891 Personal history of nicotine dependence: Secondary | ICD-10-CM | POA: Diagnosis not present

## 2022-05-30 DIAGNOSIS — M479 Spondylosis, unspecified: Secondary | ICD-10-CM

## 2022-05-30 DIAGNOSIS — J700 Acute pulmonary manifestations due to radiation: Secondary | ICD-10-CM | POA: Diagnosis not present

## 2022-05-30 DIAGNOSIS — I110 Hypertensive heart disease with heart failure: Secondary | ICD-10-CM | POA: Diagnosis not present

## 2022-05-30 DIAGNOSIS — I5022 Chronic systolic (congestive) heart failure: Secondary | ICD-10-CM | POA: Diagnosis not present

## 2022-05-30 DIAGNOSIS — R1311 Dysphagia, oral phase: Secondary | ICD-10-CM | POA: Diagnosis not present

## 2022-05-30 DIAGNOSIS — G8929 Other chronic pain: Secondary | ICD-10-CM

## 2022-05-30 DIAGNOSIS — G4733 Obstructive sleep apnea (adult) (pediatric): Secondary | ICD-10-CM | POA: Diagnosis not present

## 2022-05-30 DIAGNOSIS — M6250 Muscle wasting and atrophy, not elsewhere classified, unspecified site: Secondary | ICD-10-CM | POA: Diagnosis not present

## 2022-05-30 DIAGNOSIS — J4550 Severe persistent asthma, uncomplicated: Secondary | ICD-10-CM | POA: Diagnosis not present

## 2022-05-30 DIAGNOSIS — I771 Stricture of artery: Secondary | ICD-10-CM | POA: Diagnosis not present

## 2022-05-30 DIAGNOSIS — S32010D Wedge compression fracture of first lumbar vertebra, subsequent encounter for fracture with routine healing: Secondary | ICD-10-CM | POA: Diagnosis not present

## 2022-05-30 DIAGNOSIS — R29898 Other symptoms and signs involving the musculoskeletal system: Secondary | ICD-10-CM | POA: Diagnosis not present

## 2022-05-30 DIAGNOSIS — K551 Chronic vascular disorders of intestine: Secondary | ICD-10-CM | POA: Diagnosis not present

## 2022-05-30 MED ORDER — MORPHINE SULFATE ER 15 MG PO TBCR: 15.0000 mg | EXTENDED_RELEASE_TABLET | Freq: Two times a day (BID) | ORAL | 0 refills | Status: DC

## 2022-05-30 MED ORDER — OXYCODONE-ACETAMINOPHEN 10-325 MG PO TABS: 1.0000 | ORAL_TABLET | Freq: Four times a day (QID) | ORAL | 0 refills | Status: DC | PRN

## 2022-05-31 DIAGNOSIS — I110 Hypertensive heart disease with heart failure: Secondary | ICD-10-CM | POA: Diagnosis not present

## 2022-05-31 DIAGNOSIS — I5022 Chronic systolic (congestive) heart failure: Secondary | ICD-10-CM | POA: Diagnosis not present

## 2022-05-31 DIAGNOSIS — J449 Chronic obstructive pulmonary disease, unspecified: Secondary | ICD-10-CM | POA: Diagnosis not present

## 2022-05-31 DIAGNOSIS — K551 Chronic vascular disorders of intestine: Secondary | ICD-10-CM | POA: Diagnosis not present

## 2022-05-31 DIAGNOSIS — S32010D Wedge compression fracture of first lumbar vertebra, subsequent encounter for fracture with routine healing: Secondary | ICD-10-CM | POA: Diagnosis not present

## 2022-05-31 DIAGNOSIS — I252 Old myocardial infarction: Secondary | ICD-10-CM | POA: Diagnosis not present

## 2022-05-31 DIAGNOSIS — M6250 Muscle wasting and atrophy, not elsewhere classified, unspecified site: Secondary | ICD-10-CM | POA: Diagnosis not present

## 2022-05-31 DIAGNOSIS — R1311 Dysphagia, oral phase: Secondary | ICD-10-CM | POA: Diagnosis not present

## 2022-05-31 DIAGNOSIS — I771 Stricture of artery: Secondary | ICD-10-CM | POA: Diagnosis not present

## 2022-06-03 DIAGNOSIS — R1311 Dysphagia, oral phase: Secondary | ICD-10-CM | POA: Diagnosis not present

## 2022-06-03 DIAGNOSIS — J449 Chronic obstructive pulmonary disease, unspecified: Secondary | ICD-10-CM | POA: Diagnosis not present

## 2022-06-03 DIAGNOSIS — K551 Chronic vascular disorders of intestine: Secondary | ICD-10-CM | POA: Diagnosis not present

## 2022-06-03 DIAGNOSIS — I5022 Chronic systolic (congestive) heart failure: Secondary | ICD-10-CM | POA: Diagnosis not present

## 2022-06-03 DIAGNOSIS — S32010D Wedge compression fracture of first lumbar vertebra, subsequent encounter for fracture with routine healing: Secondary | ICD-10-CM | POA: Diagnosis not present

## 2022-06-03 DIAGNOSIS — I110 Hypertensive heart disease with heart failure: Secondary | ICD-10-CM | POA: Diagnosis not present

## 2022-06-03 DIAGNOSIS — I771 Stricture of artery: Secondary | ICD-10-CM | POA: Diagnosis not present

## 2022-06-03 DIAGNOSIS — I252 Old myocardial infarction: Secondary | ICD-10-CM | POA: Diagnosis not present

## 2022-06-03 DIAGNOSIS — M6250 Muscle wasting and atrophy, not elsewhere classified, unspecified site: Secondary | ICD-10-CM | POA: Diagnosis not present

## 2022-06-06 DIAGNOSIS — Z1231 Encounter for screening mammogram for malignant neoplasm of breast: Secondary | ICD-10-CM | POA: Diagnosis not present

## 2022-06-06 DIAGNOSIS — E559 Vitamin D deficiency, unspecified: Secondary | ICD-10-CM | POA: Diagnosis not present

## 2022-06-06 DIAGNOSIS — E785 Hyperlipidemia, unspecified: Secondary | ICD-10-CM | POA: Diagnosis not present

## 2022-06-06 DIAGNOSIS — E538 Deficiency of other specified B group vitamins: Secondary | ICD-10-CM | POA: Diagnosis not present

## 2022-06-06 DIAGNOSIS — H9190 Unspecified hearing loss, unspecified ear: Secondary | ICD-10-CM | POA: Diagnosis not present

## 2022-06-06 DIAGNOSIS — D509 Iron deficiency anemia, unspecified: Secondary | ICD-10-CM | POA: Diagnosis not present

## 2022-06-06 DIAGNOSIS — J439 Emphysema, unspecified: Secondary | ICD-10-CM | POA: Diagnosis not present

## 2022-06-07 DIAGNOSIS — K551 Chronic vascular disorders of intestine: Secondary | ICD-10-CM | POA: Diagnosis not present

## 2022-06-07 DIAGNOSIS — J439 Emphysema, unspecified: Secondary | ICD-10-CM | POA: Diagnosis not present

## 2022-06-07 DIAGNOSIS — M6250 Muscle wasting and atrophy, not elsewhere classified, unspecified site: Secondary | ICD-10-CM | POA: Diagnosis not present

## 2022-06-07 DIAGNOSIS — I110 Hypertensive heart disease with heart failure: Secondary | ICD-10-CM | POA: Diagnosis not present

## 2022-06-07 DIAGNOSIS — I771 Stricture of artery: Secondary | ICD-10-CM | POA: Diagnosis not present

## 2022-06-07 DIAGNOSIS — I5022 Chronic systolic (congestive) heart failure: Secondary | ICD-10-CM | POA: Diagnosis not present

## 2022-06-07 DIAGNOSIS — I252 Old myocardial infarction: Secondary | ICD-10-CM | POA: Diagnosis not present

## 2022-06-07 DIAGNOSIS — J449 Chronic obstructive pulmonary disease, unspecified: Secondary | ICD-10-CM | POA: Diagnosis not present

## 2022-06-07 DIAGNOSIS — R1311 Dysphagia, oral phase: Secondary | ICD-10-CM | POA: Diagnosis not present

## 2022-06-07 DIAGNOSIS — S32010D Wedge compression fracture of first lumbar vertebra, subsequent encounter for fracture with routine healing: Secondary | ICD-10-CM | POA: Diagnosis not present

## 2022-06-08 DIAGNOSIS — G4733 Obstructive sleep apnea (adult) (pediatric): Secondary | ICD-10-CM | POA: Diagnosis not present

## 2022-06-11 LAB — IRON AND TIBC
Iron Saturation: 19
Iron: 53
TIBC: 285
UIBC: 232

## 2022-06-11 LAB — BRAIN NATRIURETIC PEPTIDE: B Natriuretic Peptide: 267

## 2022-06-11 LAB — FERRITIN: Ferritin: 149

## 2022-06-13 DIAGNOSIS — K551 Chronic vascular disorders of intestine: Secondary | ICD-10-CM | POA: Diagnosis not present

## 2022-06-13 DIAGNOSIS — J449 Chronic obstructive pulmonary disease, unspecified: Secondary | ICD-10-CM | POA: Diagnosis not present

## 2022-06-13 DIAGNOSIS — I252 Old myocardial infarction: Secondary | ICD-10-CM | POA: Diagnosis not present

## 2022-06-13 DIAGNOSIS — I110 Hypertensive heart disease with heart failure: Secondary | ICD-10-CM | POA: Diagnosis not present

## 2022-06-13 DIAGNOSIS — I771 Stricture of artery: Secondary | ICD-10-CM | POA: Diagnosis not present

## 2022-06-13 DIAGNOSIS — S32010D Wedge compression fracture of first lumbar vertebra, subsequent encounter for fracture with routine healing: Secondary | ICD-10-CM | POA: Diagnosis not present

## 2022-06-13 DIAGNOSIS — I5022 Chronic systolic (congestive) heart failure: Secondary | ICD-10-CM | POA: Diagnosis not present

## 2022-06-13 DIAGNOSIS — R1311 Dysphagia, oral phase: Secondary | ICD-10-CM | POA: Diagnosis not present

## 2022-06-13 DIAGNOSIS — M6250 Muscle wasting and atrophy, not elsewhere classified, unspecified site: Secondary | ICD-10-CM | POA: Diagnosis not present

## 2022-06-21 ENCOUNTER — Encounter: Payer: Self-pay | Admitting: Hematology and Oncology

## 2022-06-21 DIAGNOSIS — G4733 Obstructive sleep apnea (adult) (pediatric): Secondary | ICD-10-CM | POA: Diagnosis not present

## 2022-06-28 DIAGNOSIS — I252 Old myocardial infarction: Secondary | ICD-10-CM | POA: Diagnosis not present

## 2022-06-28 DIAGNOSIS — R1311 Dysphagia, oral phase: Secondary | ICD-10-CM | POA: Diagnosis not present

## 2022-06-28 DIAGNOSIS — I771 Stricture of artery: Secondary | ICD-10-CM | POA: Diagnosis not present

## 2022-06-28 DIAGNOSIS — J449 Chronic obstructive pulmonary disease, unspecified: Secondary | ICD-10-CM | POA: Diagnosis not present

## 2022-06-28 DIAGNOSIS — I5022 Chronic systolic (congestive) heart failure: Secondary | ICD-10-CM | POA: Diagnosis not present

## 2022-06-28 DIAGNOSIS — K551 Chronic vascular disorders of intestine: Secondary | ICD-10-CM | POA: Diagnosis not present

## 2022-06-28 DIAGNOSIS — S32010D Wedge compression fracture of first lumbar vertebra, subsequent encounter for fracture with routine healing: Secondary | ICD-10-CM | POA: Diagnosis not present

## 2022-06-28 DIAGNOSIS — I110 Hypertensive heart disease with heart failure: Secondary | ICD-10-CM | POA: Diagnosis not present

## 2022-06-28 DIAGNOSIS — Z7902 Long term (current) use of antithrombotics/antiplatelets: Secondary | ICD-10-CM | POA: Diagnosis not present

## 2022-06-28 DIAGNOSIS — M6250 Muscle wasting and atrophy, not elsewhere classified, unspecified site: Secondary | ICD-10-CM | POA: Diagnosis not present

## 2022-07-01 ENCOUNTER — Other Ambulatory Visit: Payer: Self-pay

## 2022-07-01 DIAGNOSIS — M479 Spondylosis, unspecified: Secondary | ICD-10-CM

## 2022-07-01 DIAGNOSIS — G8929 Other chronic pain: Secondary | ICD-10-CM

## 2022-07-01 DIAGNOSIS — Z8739 Personal history of other diseases of the musculoskeletal system and connective tissue: Secondary | ICD-10-CM

## 2022-07-01 MED ORDER — MORPHINE SULFATE ER 15 MG PO TBCR: 15.0000 mg | EXTENDED_RELEASE_TABLET | Freq: Two times a day (BID) | ORAL | 0 refills | Status: DC

## 2022-07-01 MED ORDER — OXYCODONE-ACETAMINOPHEN 10-325 MG PO TABS: 1.0000 | ORAL_TABLET | Freq: Four times a day (QID) | ORAL | 0 refills | Status: DC | PRN

## 2022-07-02 ENCOUNTER — Encounter: Payer: Self-pay | Admitting: Hematology and Oncology

## 2022-07-02 ENCOUNTER — Other Ambulatory Visit: Payer: Self-pay | Admitting: Hematology and Oncology

## 2022-07-02 DIAGNOSIS — Z8739 Personal history of other diseases of the musculoskeletal system and connective tissue: Secondary | ICD-10-CM

## 2022-07-02 DIAGNOSIS — M479 Spondylosis, unspecified: Secondary | ICD-10-CM

## 2022-07-02 DIAGNOSIS — G8929 Other chronic pain: Secondary | ICD-10-CM

## 2022-07-02 MED ORDER — OXYCODONE-ACETAMINOPHEN 10-325 MG PO TABS: 1.0000 | ORAL_TABLET | Freq: Four times a day (QID) | ORAL | 0 refills | Status: DC | PRN

## 2022-07-04 DIAGNOSIS — Z7902 Long term (current) use of antithrombotics/antiplatelets: Secondary | ICD-10-CM | POA: Diagnosis not present

## 2022-07-04 DIAGNOSIS — S32010D Wedge compression fracture of first lumbar vertebra, subsequent encounter for fracture with routine healing: Secondary | ICD-10-CM | POA: Diagnosis not present

## 2022-07-04 DIAGNOSIS — I110 Hypertensive heart disease with heart failure: Secondary | ICD-10-CM | POA: Diagnosis not present

## 2022-07-04 DIAGNOSIS — M6250 Muscle wasting and atrophy, not elsewhere classified, unspecified site: Secondary | ICD-10-CM | POA: Diagnosis not present

## 2022-07-04 DIAGNOSIS — I771 Stricture of artery: Secondary | ICD-10-CM | POA: Diagnosis not present

## 2022-07-04 DIAGNOSIS — I252 Old myocardial infarction: Secondary | ICD-10-CM | POA: Diagnosis not present

## 2022-07-04 DIAGNOSIS — J449 Chronic obstructive pulmonary disease, unspecified: Secondary | ICD-10-CM | POA: Diagnosis not present

## 2022-07-04 DIAGNOSIS — K551 Chronic vascular disorders of intestine: Secondary | ICD-10-CM | POA: Diagnosis not present

## 2022-07-04 DIAGNOSIS — I5022 Chronic systolic (congestive) heart failure: Secondary | ICD-10-CM | POA: Diagnosis not present

## 2022-07-04 DIAGNOSIS — R1311 Dysphagia, oral phase: Secondary | ICD-10-CM | POA: Diagnosis not present

## 2022-07-07 DIAGNOSIS — J439 Emphysema, unspecified: Secondary | ICD-10-CM | POA: Diagnosis not present

## 2022-07-08 ENCOUNTER — Other Ambulatory Visit: Payer: Self-pay | Admitting: Oncology

## 2022-07-08 ENCOUNTER — Telehealth: Payer: Self-pay

## 2022-07-08 DIAGNOSIS — S32010D Wedge compression fracture of first lumbar vertebra, subsequent encounter for fracture with routine healing: Secondary | ICD-10-CM | POA: Diagnosis not present

## 2022-07-08 DIAGNOSIS — G4733 Obstructive sleep apnea (adult) (pediatric): Secondary | ICD-10-CM | POA: Diagnosis not present

## 2022-07-08 DIAGNOSIS — Z7902 Long term (current) use of antithrombotics/antiplatelets: Secondary | ICD-10-CM | POA: Diagnosis not present

## 2022-07-08 DIAGNOSIS — I252 Old myocardial infarction: Secondary | ICD-10-CM | POA: Diagnosis not present

## 2022-07-08 DIAGNOSIS — I110 Hypertensive heart disease with heart failure: Secondary | ICD-10-CM | POA: Diagnosis not present

## 2022-07-08 DIAGNOSIS — I5022 Chronic systolic (congestive) heart failure: Secondary | ICD-10-CM | POA: Diagnosis not present

## 2022-07-08 DIAGNOSIS — K551 Chronic vascular disorders of intestine: Secondary | ICD-10-CM | POA: Diagnosis not present

## 2022-07-08 DIAGNOSIS — S22080D Wedge compression fracture of T11-T12 vertebra, subsequent encounter for fracture with routine healing: Secondary | ICD-10-CM

## 2022-07-08 DIAGNOSIS — J449 Chronic obstructive pulmonary disease, unspecified: Secondary | ICD-10-CM | POA: Diagnosis not present

## 2022-07-08 DIAGNOSIS — R1311 Dysphagia, oral phase: Secondary | ICD-10-CM | POA: Diagnosis not present

## 2022-07-08 DIAGNOSIS — M6250 Muscle wasting and atrophy, not elsewhere classified, unspecified site: Secondary | ICD-10-CM | POA: Diagnosis not present

## 2022-07-08 DIAGNOSIS — I771 Stricture of artery: Secondary | ICD-10-CM | POA: Diagnosis not present

## 2022-07-08 MED ORDER — HYDROCODONE-ACETAMINOPHEN 10-325 MG PO TABS: 1.0000 | ORAL_TABLET | ORAL | 0 refills | Status: DC | PRN

## 2022-07-08 NOTE — Telephone Encounter (Signed)
Patient called to let us know she is unable to obtain her Oxycodone from [facility] due to nationwide shortage. Patient is needing a pain medication substitute.

## 2022-07-09 ENCOUNTER — Encounter: Payer: Self-pay | Admitting: Hematology and Oncology

## 2022-07-09 NOTE — Telephone Encounter (Signed)
No answer and no VM

## 2022-07-11 DIAGNOSIS — I5022 Chronic systolic (congestive) heart failure: Secondary | ICD-10-CM | POA: Diagnosis not present

## 2022-07-11 DIAGNOSIS — S32010D Wedge compression fracture of first lumbar vertebra, subsequent encounter for fracture with routine healing: Secondary | ICD-10-CM | POA: Diagnosis not present

## 2022-07-11 DIAGNOSIS — M6250 Muscle wasting and atrophy, not elsewhere classified, unspecified site: Secondary | ICD-10-CM | POA: Diagnosis not present

## 2022-07-11 DIAGNOSIS — R1311 Dysphagia, oral phase: Secondary | ICD-10-CM | POA: Diagnosis not present

## 2022-07-11 DIAGNOSIS — I252 Old myocardial infarction: Secondary | ICD-10-CM | POA: Diagnosis not present

## 2022-07-11 DIAGNOSIS — Z7902 Long term (current) use of antithrombotics/antiplatelets: Secondary | ICD-10-CM | POA: Diagnosis not present

## 2022-07-11 DIAGNOSIS — K551 Chronic vascular disorders of intestine: Secondary | ICD-10-CM | POA: Diagnosis not present

## 2022-07-11 DIAGNOSIS — I771 Stricture of artery: Secondary | ICD-10-CM | POA: Diagnosis not present

## 2022-07-11 DIAGNOSIS — J449 Chronic obstructive pulmonary disease, unspecified: Secondary | ICD-10-CM | POA: Diagnosis not present

## 2022-07-11 DIAGNOSIS — I110 Hypertensive heart disease with heart failure: Secondary | ICD-10-CM | POA: Diagnosis not present

## 2022-07-16 ENCOUNTER — Encounter: Payer: Self-pay | Admitting: Cardiology

## 2022-07-16 ENCOUNTER — Ambulatory Visit: Payer: Medicare Other | Attending: Cardiology | Admitting: Cardiology

## 2022-07-16 VITALS — BP 160/72 | HR 102 | Ht 66.0 in | Wt 173.6 lb

## 2022-07-16 DIAGNOSIS — E785 Hyperlipidemia, unspecified: Secondary | ICD-10-CM | POA: Diagnosis not present

## 2022-07-16 DIAGNOSIS — I6529 Occlusion and stenosis of unspecified carotid artery: Secondary | ICD-10-CM | POA: Diagnosis not present

## 2022-07-16 DIAGNOSIS — F17200 Nicotine dependence, unspecified, uncomplicated: Secondary | ICD-10-CM | POA: Diagnosis not present

## 2022-07-16 DIAGNOSIS — I5022 Chronic systolic (congestive) heart failure: Secondary | ICD-10-CM | POA: Diagnosis not present

## 2022-07-16 DIAGNOSIS — E782 Mixed hyperlipidemia: Secondary | ICD-10-CM | POA: Diagnosis not present

## 2022-07-16 DIAGNOSIS — I2510 Atherosclerotic heart disease of native coronary artery without angina pectoris: Secondary | ICD-10-CM

## 2022-07-16 MED ORDER — ROSUVASTATIN CALCIUM 20 MG PO TABS: 20.0000 mg | ORAL_TABLET | Freq: Every day | ORAL | 3 refills | Status: DC

## 2022-07-16 NOTE — Patient Instructions (Addendum)
Medication Instructions:  Your physician has recommended you make the following change in your medication:   Increase your Crestor to 20 mg daily.  *If you need a refill on your cardiac medications before your next appointment, please call your pharmacy*   Lab Work: Your physician recommends that you return for lab work in: 6 weeks You need to have labs done when you are fasting.  You can come Monday through Friday 8:30 am to 12:00 pm and 1:15 to 4:30. You do not need to make an appointment as the order has already been placed. The labs you are going to have done are AST, ALT and Lipids.  If you have labs (blood work) drawn today and your tests are completely normal, you will receive your results only by: [place] (if you have MyChart) OR A paper copy in the mail If you have any lab test that is abnormal or we need to change your treatment, we will call you to review the results.   Testing/Procedures: Your physician has requested that you have a carotid duplex. This test is an ultrasound of the carotid arteries in your neck. It looks at blood flow through these arteries that supply the brain with blood. Allow one hour for this exam. There are no restrictions or special instructions.    Follow-Up: At [facility], you and your health needs are our priority.  As part of our continuing mission to provide you with exceptional heart care, we have created designated Provider Care Teams.  These Care Teams include your primary Cardiologist (physician) and Advanced Practice Providers (APPs -  Physician Assistants and Nurse Practitioners) who all work together to provide you with the care you need, when you need it.  We recommend signing up for the patient portal called "MyChart".  Sign up information is provided on this After Visit Summary.  MyChart is used to connect with patients for Virtual Visits (Telemedicine).  Patients are able to view lab/test results, encounter notes, upcoming  appointments, etc.  Non-urgent messages can be sent to your provider as well.   To learn more about what you can do with MyChart, go to [email or web address].    Your next appointment:   6 month(s)  The format for your next appointment:   In Person  Provider:   Jenne Campus, MD   Other Instructions NA

## 2022-07-16 NOTE — Progress Notes (Signed)
Cardiology Office Note:    Date:  07/16/2022   ID:  Ashley Miles, DOB [birth year], MRN [id number]  PCP:  Earlyne Iba, NP  Cardiologist:  Jenne Campus, MD    Referring MD: Penelope Coop, FNP   Chief Complaint  Patient presents with   Follow-up    History of Present Illness:    Ashley Miles is a 76 y.o. female   past medical history significant for coronary artery disease, status post non-STEMI in 2019, she did have cardiomyopathy however her left ventricle ejection fraction improved to normalization, likely finally she quit smoking.  There is also some noncompliance.  Last time I have seen and it was in January.  She ended up being in the hospital before because of TIA all workup has been negative.  She was find to have 79% stenosis both carotic arteries but not critical enough to intervene. She is coming today 2 months for follow-up.  Overall she is doing very well.  She denies of any chest pain tightness squeezing pressure burning chest, probably she tell me she quit smoking years ago and she abstain from smoking which I think is very good news  Past Medical History:  Diagnosis Date   Acute blood loss anemia    Acute kidney failure (HCC)    Acute systolic heart failure (HCC)    Anemia, unspecified 07/11/2020   Anxiety    Arthritis    Cardiogenic shock (HCC)    CHF (congestive heart failure) ([name])    Chronic systolic heart failure ([name]) 03/03/2017   Cardiac catheterization 2018 showed diagonal branch occluded and right coronary artery stenosis however right coronary artery was very small, the feeling was that degree of coronary artery disease is low to her ejection fraction.   COPD (chronic obstructive pulmonary disease) ([name])    Coronary artery disease 03/03/2017   Cardiac catheterization June 2018 showing 90% small RCA, completely occluded diagonal branch. Unsuccessful attempt to open up diagonal branch.   Drug-induced constipation    Dyslipidemia 04/03/2017    Gastric ulcer    GERD (gastroesophageal reflux disease)    Hiatal hernia    Hyperlipidemia    Hypertension    Ischemic cardiomyopathy 05/21/2018   Lung cancer ([place]) 03/03/2017   NSTEMI (non-ST elevated myocardial infarction) ([place]) 01/09/2017   Osteopenia    Small cell lung cancer ([place])    Smoking 11/20/2017   T12 compression fracture (HCC)    Tobacco use     Past Surgical History:  Procedure Laterality Date   CESAREAN SECTION     ESOPHAGOGASTRODUODENOSCOPY  02/17/2017   Healing of the gastic ulcer- no antral scar. Small hiatal hernia   LEFT HEART CATH AND CORONARY ANGIOGRAPHY N/A 01/09/2017   Procedure: Left Heart Cath and Coronary Angiography;  Surgeon: Burnell Blanks, MD;  Location: [facility];  Service: Cardiovascular;  Laterality: N/A;   LUNG CANCER SURGERY  2014    Current Medications: Current Meds  Medication Sig   albuterol (PROVENTIL) (2.5 MG/3ML) 0.083% nebulizer solution Take 2.5 mg by nebulization every 6 (six) hours as needed for wheezing or shortness of breath.   albuterol (VENTOLIN HFA) 108 (90 Base) MCG/ACT inhaler Inhale 2 puffs into the lungs as needed for shortness of breath.   buPROPion (WELLBUTRIN SR) 150 MG 12 hr tablet Take 150 mg by mouth 2 (two) times daily.   busPIRone (BUSPAR) 15 MG tablet Take 15 mg by mouth daily.   Cholecalciferol (VITAMIN D3) 1.25 MG (50000 UT) TABS  Take 1 tablet by mouth once a week.   clopidogrel (PLAVIX) 75 MG tablet Take 1 tablet (75 mg total) by mouth daily.   Cyanocobalamin (VITAMIN B-12 PO) Take 1 tablet by mouth daily.   dicyclomine (BENTYL) 10 MG capsule Take 10 mg by mouth every 6 (six) hours.   doxepin (SINEQUAN) 10 MG capsule Take 10 mg by mouth daily.   doxylamine, Sleep, (UNISOM) 25 MG tablet Take 25 mg by mouth at bedtime as needed for sleep.   ezetimibe (ZETIA) 10 MG tablet Take 1 tablet (10 mg total) by mouth daily.   Ferrous Sulfate (FEROSUL PO) Take 325 mg by mouth daily.   Ferrous Sulfate (IRON PO)  Take 1 tablet by mouth daily.   folic acid (FOLVITE) 1 MG tablet Take 1 tablet (1 mg total) by mouth daily.   furosemide (LASIX) 20 MG tablet Take 0.5 tablets (10 mg total) by mouth daily.   HYDROcodone-acetaminophen (NORCO) 10-325 MG tablet Take 1 tablet by mouth every 4 (four) hours as needed. (Patient taking differently: Take 1 tablet by mouth every 4 (four) hours as needed for moderate pain or severe pain.)   ipratropium-albuterol (DUONEB) 0.5-2.5 (3) MG/3ML SOLN Take 3 mLs by nebulization every 6 (six) hours as needed (SOB or wheezing).   lubiprostone (AMITIZA) 24 MCG capsule Take 24 mcg by mouth 2 (two) times daily with a meal.   metoprolol succinate (TOPROL-XL) 25 MG 24 hr tablet TAKE 1 TABLET BY MOUTH ONCE DAILY. (Patient taking differently: Take 25 mg by mouth daily.)   metoprolol succinate (TOPROL-XL) 25 MG 24 hr tablet Take 25 mg by mouth daily.   morphine (MS CONTIN) 15 MG 12 hr tablet Take 1 tablet (15 mg total) by mouth every 12 (twelve) hours.   naloxegol oxalate (MOVANTIK) 25 MG TABS tablet Take 25 mg by mouth daily.   nitroGLYCERIN (NITROSTAT) 0.4 MG SL tablet Place 1 tablet (0.4 mg total) under the tongue every 5 (five) minutes x 3 doses as needed for chest pain.   ondansetron (ZOFRAN ODT) 4 MG disintegrating tablet 4mg  ODT q4 hours prn nausea/vomit (Patient taking differently: Take 4 mg by mouth every 4 (four) hours as needed for nausea or vomiting. 4mg  ODT q4 hours prn nausea/vomit)   pantoprazole (PROTONIX) 40 MG tablet Take 40 mg by mouth 2 (two) times daily.   polyethylene glycol (MIRALAX / GLYCOLAX) packet Take 17 g by mouth daily.   potassium chloride (K-DUR) 10 MEQ tablet Take 10 mEq by mouth daily.   prochlorperazine (COMPAZINE) 10 MG tablet Take 10 mg by mouth 3 (three) times daily as needed for nausea/vomiting, nausea or vomiting.   tiZANidine (ZANAFLEX) 4 MG tablet Take 4 mg by mouth at bedtime as needed for muscle spasms.   TRELEGY ELLIPTA 200-62.5-25 MCG/ACT AEPB  Inhale 1 puff into the lungs daily.   Vitamin D, Ergocalciferol, (DRISDOL) 50000 units CAPS capsule Take 50,000 Units by mouth every 7 (seven) days.   [DISCONTINUED] ondansetron (ZOFRAN) 4 MG tablet Take 4 mg by mouth every 8 (eight) hours as needed for nausea or vomiting.   [DISCONTINUED] rosuvastatin (CRESTOR) 10 MG tablet Take 1 tablet by mouth daily.     Allergies:   Patient has no known allergies.   Social History   Socioeconomic History   Marital status: Divorced    Spouse name: Not on file   Number of children: Not on file   Years of education: Not on file   Highest education level: Not on file  Occupational  History   Not on file  Tobacco Use   Smoking status: Former    Types: Cigarettes    Quit date: 06/05/2021    Years since quitting: 1.1   Smokeless tobacco: Never  Vaping Use   Vaping Use: Former  Substance and Sexual Activity   Alcohol use: No   Drug use: No   Sexual activity: Not on file  Other Topics Concern   Not on file  Social History Narrative   Not on file   Social Determinants of Health   Financial Resource Strain: Not on file  Food Insecurity: Not on file  Transportation Needs: Not on file  Physical Activity: Not on file  Stress: Not on file  Social Connections: Not on file     Family History: The patient's family history includes Blindness in her mother; Cancer in her father; Hypertension in her father. There is no history of Colon cancer or Esophageal cancer. ROS:   Please see the history of present illness.    All 14 point review of systems negative except as described per history of present illness  EKGs/Labs/Other Studies Reviewed:      Recent Labs: 08/10/2021: TSH 6.450 03/06/2022: ALT 5; B Natriuretic Peptide 267.0; BUN 13; Creatinine 1.2; Potassium 4.9; Sodium 142 03/27/2022: Hemoglobin 11.3; Platelets 288  Recent Lipid Panel    Component Value Date/Time   CHOL 126 03/06/2022 0000   CHOL 146 05/21/2018 1134   TRIG 108 03/06/2022  0000   HDL 56 03/06/2022 0000   HDL 47 05/21/2018 1134   CHOLHDL 3.1 05/21/2018 1134   CHOLHDL 5.0 01/11/2017 0521   VLDL 27 01/11/2017 0521   LDLCALC 50 03/06/2022 0000   LDLCALC 60 05/21/2018 1134    Physical Exam:    VS:  BP (!) 160/72 (BP Location: Left Arm, Patient Position: Sitting)   Pulse (!) 102   Ht 5\' 6"  (1.676 m)   Wt 173 lb 9.6 oz (78.7 kg)   SpO2 99%   BMI 28.02 kg/m     Wt Readings from Last 3 Encounters:  07/16/22 173 lb 9.6 oz (78.7 kg)  05/29/22 170 lb (77.1 kg)  03/27/22 177 lb (80.3 kg)     GEN:  Well nourished, well developed in no acute distress HEENT: Normal NECK: No JVD; No carotid bruits LYMPHATICS: No lymphadenopathy CARDIAC: RRR, no murmurs, no rubs, no gallops RESPIRATORY:  Clear to auscultation without rales, wheezing or rhonchi  ABDOMEN: Soft, non-tender, non-distended MUSCULOSKELETAL:  No edema; No deformity  SKIN: Warm and dry LOWER EXTREMITIES: no swelling NEUROLOGIC:  Alert and oriented x 3 PSYCHIATRIC:  Normal affect   ASSESSMENT:    1. Dyslipidemia   2. Stenosis of carotid artery, unspecified laterality   3. Chronic systolic heart failure ([place])   4. Coronary artery disease involving native coronary artery of native heart without angina pectoris   5. Mixed hyperlipidemia   6. Smoking    PLAN:    In order of problems listed above:  Dyslipidemia.  I will ask her to increase dose of Crestor, I do have her K PN which show me her LDL 133 HDL 45.  She is already taking Zetia which I will continue. Carotic arterial stenosis, we will schedule him to have carotic ultrasound. Essential hypertension, uncontrolled however I think we dealing with whitecoat hypertension.  She got all different nurses coming and checking her at home and his blood pressure is always good.  Will continue monitoring Coronary disease stable on appropriate medication which I  continue Smoking, likely she quit a year ago and I congratulated her for  it.   Medication Adjustments/Labs and Tests Ordered: Current medicines are reviewed at length with the patient today.  Concerns regarding medicines are outlined above.  Orders Placed This Encounter  Procedures   AST   ALT   Lipid panel   VAS US CAROTID   Medication changes:  Meds ordered this encounter  Medications   rosuvastatin (CRESTOR) 20 MG tablet    Sig: Take 1 tablet (20 mg total) by mouth daily.    Dispense:  90 tablet    Refill:  3    Signed, Park Liter, MD, [facility] 07/16/2022 5:00 PM    [place]

## 2022-07-17 DIAGNOSIS — R1311 Dysphagia, oral phase: Secondary | ICD-10-CM | POA: Diagnosis not present

## 2022-07-17 DIAGNOSIS — S32010D Wedge compression fracture of first lumbar vertebra, subsequent encounter for fracture with routine healing: Secondary | ICD-10-CM | POA: Diagnosis not present

## 2022-07-17 DIAGNOSIS — J449 Chronic obstructive pulmonary disease, unspecified: Secondary | ICD-10-CM | POA: Diagnosis not present

## 2022-07-17 DIAGNOSIS — M6250 Muscle wasting and atrophy, not elsewhere classified, unspecified site: Secondary | ICD-10-CM | POA: Diagnosis not present

## 2022-07-17 DIAGNOSIS — Z7902 Long term (current) use of antithrombotics/antiplatelets: Secondary | ICD-10-CM | POA: Diagnosis not present

## 2022-07-17 DIAGNOSIS — K551 Chronic vascular disorders of intestine: Secondary | ICD-10-CM | POA: Diagnosis not present

## 2022-07-17 DIAGNOSIS — I771 Stricture of artery: Secondary | ICD-10-CM | POA: Diagnosis not present

## 2022-07-17 DIAGNOSIS — I252 Old myocardial infarction: Secondary | ICD-10-CM | POA: Diagnosis not present

## 2022-07-17 DIAGNOSIS — I5022 Chronic systolic (congestive) heart failure: Secondary | ICD-10-CM | POA: Diagnosis not present

## 2022-07-17 DIAGNOSIS — I110 Hypertensive heart disease with heart failure: Secondary | ICD-10-CM | POA: Diagnosis not present

## 2022-07-21 DIAGNOSIS — G4733 Obstructive sleep apnea (adult) (pediatric): Secondary | ICD-10-CM | POA: Diagnosis not present

## 2022-07-22 ENCOUNTER — Telehealth: Payer: Self-pay

## 2022-07-22 ENCOUNTER — Other Ambulatory Visit: Payer: Self-pay

## 2022-07-22 ENCOUNTER — Other Ambulatory Visit: Payer: Self-pay | Admitting: Oncology

## 2022-07-22 DIAGNOSIS — Z77122 Contact with and (suspected) exposure to noise: Secondary | ICD-10-CM | POA: Diagnosis not present

## 2022-07-22 DIAGNOSIS — H9193 Unspecified hearing loss, bilateral: Secondary | ICD-10-CM | POA: Diagnosis not present

## 2022-07-22 DIAGNOSIS — H903 Sensorineural hearing loss, bilateral: Secondary | ICD-10-CM | POA: Diagnosis not present

## 2022-07-22 MED ORDER — OXYCODONE-ACETAMINOPHEN 10-325 MG PO TABS: 1.0000 | ORAL_TABLET | Freq: Four times a day (QID) | ORAL | 0 refills | Status: DC | PRN

## 2022-07-22 NOTE — Telephone Encounter (Signed)
Pt's daughter states her mom called her this morning and states she is out of the hydrocodone/apap (initially was on oxycodone/apap-but pharmacy unable to fill).

## 2022-07-25 DIAGNOSIS — Z7902 Long term (current) use of antithrombotics/antiplatelets: Secondary | ICD-10-CM | POA: Diagnosis not present

## 2022-07-25 DIAGNOSIS — S32010D Wedge compression fracture of first lumbar vertebra, subsequent encounter for fracture with routine healing: Secondary | ICD-10-CM | POA: Diagnosis not present

## 2022-07-25 DIAGNOSIS — M6250 Muscle wasting and atrophy, not elsewhere classified, unspecified site: Secondary | ICD-10-CM | POA: Diagnosis not present

## 2022-07-25 DIAGNOSIS — I5022 Chronic systolic (congestive) heart failure: Secondary | ICD-10-CM | POA: Diagnosis not present

## 2022-07-25 DIAGNOSIS — K551 Chronic vascular disorders of intestine: Secondary | ICD-10-CM | POA: Diagnosis not present

## 2022-07-25 DIAGNOSIS — I771 Stricture of artery: Secondary | ICD-10-CM | POA: Diagnosis not present

## 2022-07-25 DIAGNOSIS — I252 Old myocardial infarction: Secondary | ICD-10-CM | POA: Diagnosis not present

## 2022-07-25 DIAGNOSIS — I110 Hypertensive heart disease with heart failure: Secondary | ICD-10-CM | POA: Diagnosis not present

## 2022-07-25 DIAGNOSIS — J449 Chronic obstructive pulmonary disease, unspecified: Secondary | ICD-10-CM | POA: Diagnosis not present

## 2022-07-25 DIAGNOSIS — R1311 Dysphagia, oral phase: Secondary | ICD-10-CM | POA: Diagnosis not present

## 2022-08-02 DIAGNOSIS — I5022 Chronic systolic (congestive) heart failure: Secondary | ICD-10-CM | POA: Diagnosis not present

## 2022-08-02 DIAGNOSIS — I110 Hypertensive heart disease with heart failure: Secondary | ICD-10-CM | POA: Diagnosis not present

## 2022-08-02 DIAGNOSIS — J449 Chronic obstructive pulmonary disease, unspecified: Secondary | ICD-10-CM | POA: Diagnosis not present

## 2022-08-02 DIAGNOSIS — K551 Chronic vascular disorders of intestine: Secondary | ICD-10-CM | POA: Diagnosis not present

## 2022-08-02 DIAGNOSIS — M6250 Muscle wasting and atrophy, not elsewhere classified, unspecified site: Secondary | ICD-10-CM | POA: Diagnosis not present

## 2022-08-02 DIAGNOSIS — I771 Stricture of artery: Secondary | ICD-10-CM | POA: Diagnosis not present

## 2022-08-02 DIAGNOSIS — R1311 Dysphagia, oral phase: Secondary | ICD-10-CM | POA: Diagnosis not present

## 2022-08-02 DIAGNOSIS — Z7902 Long term (current) use of antithrombotics/antiplatelets: Secondary | ICD-10-CM | POA: Diagnosis not present

## 2022-08-02 DIAGNOSIS — S32010D Wedge compression fracture of first lumbar vertebra, subsequent encounter for fracture with routine healing: Secondary | ICD-10-CM | POA: Diagnosis not present

## 2022-08-02 DIAGNOSIS — I252 Old myocardial infarction: Secondary | ICD-10-CM | POA: Diagnosis not present

## 2022-08-06 ENCOUNTER — Ambulatory Visit: Payer: Medicare Other | Attending: Cardiology

## 2022-08-06 ENCOUNTER — Other Ambulatory Visit: Payer: Self-pay

## 2022-08-06 DIAGNOSIS — I6529 Occlusion and stenosis of unspecified carotid artery: Secondary | ICD-10-CM

## 2022-08-06 DIAGNOSIS — I6523 Occlusion and stenosis of bilateral carotid arteries: Secondary | ICD-10-CM | POA: Diagnosis not present

## 2022-08-06 DIAGNOSIS — M479 Spondylosis, unspecified: Secondary | ICD-10-CM

## 2022-08-06 DIAGNOSIS — Z8739 Personal history of other diseases of the musculoskeletal system and connective tissue: Secondary | ICD-10-CM

## 2022-08-06 DIAGNOSIS — G8929 Other chronic pain: Secondary | ICD-10-CM

## 2022-08-06 MED ORDER — MORPHINE SULFATE ER 15 MG PO TBCR: 15.0000 mg | EXTENDED_RELEASE_TABLET | Freq: Two times a day (BID) | ORAL | 0 refills | Status: DC

## 2022-08-07 ENCOUNTER — Encounter: Payer: Self-pay | Admitting: Hematology and Oncology

## 2022-08-08 ENCOUNTER — Telehealth: Payer: Self-pay

## 2022-08-08 NOTE — Telephone Encounter (Signed)
Results reviewed with pt's daughter Maudie Mercury- Per DPE- as per Dr. Wendy Poet note.  Daughter verbalized understanding and had no additional questions. Routed to PCP

## 2022-08-19 ENCOUNTER — Other Ambulatory Visit: Payer: Self-pay | Admitting: Oncology

## 2022-08-19 ENCOUNTER — Telehealth: Payer: Self-pay

## 2022-08-19 ENCOUNTER — Other Ambulatory Visit: Payer: Self-pay

## 2022-08-19 DIAGNOSIS — C3490 Malignant neoplasm of unspecified part of unspecified bronchus or lung: Secondary | ICD-10-CM

## 2022-08-19 DIAGNOSIS — M5135 Other intervertebral disc degeneration, thoracolumbar region: Secondary | ICD-10-CM

## 2022-08-19 MED ORDER — OXYCODONE HCL 10 MG PO TABS: 10.0000 mg | ORAL_TABLET | ORAL | 0 refills | Status: AC | PRN

## 2022-08-19 NOTE — Telephone Encounter (Signed)
Pt needs refill of Percocet 10/325; [facility] on [address], [facility], and [facility] do not have any; all on back order - do not know when they will get anymore.  They prefer [facility] on [address]. ?? Send in just oxycodone??

## 2022-08-20 ENCOUNTER — Encounter: Payer: Self-pay | Admitting: Hematology and Oncology

## 2022-08-21 NOTE — Telephone Encounter (Signed)
Already sent.

## 2022-08-26 ENCOUNTER — Encounter: Payer: Self-pay | Admitting: Hematology and Oncology

## 2022-09-03 ENCOUNTER — Other Ambulatory Visit: Payer: Self-pay

## 2022-09-03 DIAGNOSIS — Z8739 Personal history of other diseases of the musculoskeletal system and connective tissue: Secondary | ICD-10-CM

## 2022-09-03 DIAGNOSIS — G8929 Other chronic pain: Secondary | ICD-10-CM

## 2022-09-03 DIAGNOSIS — M479 Spondylosis, unspecified: Secondary | ICD-10-CM

## 2022-09-03 MED ORDER — MORPHINE SULFATE ER 15 MG PO TBCR: 15.0000 mg | EXTENDED_RELEASE_TABLET | Freq: Two times a day (BID) | ORAL | 0 refills | Status: DC

## 2022-09-04 ENCOUNTER — Encounter: Payer: Self-pay | Admitting: Hematology and Oncology

## 2022-09-16 ENCOUNTER — Other Ambulatory Visit: Payer: Self-pay | Admitting: Oncology

## 2022-09-16 DIAGNOSIS — C3490 Malignant neoplasm of unspecified part of unspecified bronchus or lung: Secondary | ICD-10-CM

## 2022-09-16 DIAGNOSIS — D539 Nutritional anemia, unspecified: Secondary | ICD-10-CM

## 2022-09-19 ENCOUNTER — Telehealth: Payer: Self-pay

## 2022-09-19 ENCOUNTER — Other Ambulatory Visit: Payer: Self-pay

## 2022-09-19 ENCOUNTER — Encounter: Payer: Self-pay | Admitting: Hematology and Oncology

## 2022-09-19 NOTE — Telephone Encounter (Signed)
Opened in error

## 2022-09-19 NOTE — Telephone Encounter (Signed)
Patient called stating that she needs her Percocet 10-325 refilled. I do not see it on her med list. She states she got it refilled last month.

## 2022-09-19 NOTE — Progress Notes (Signed)
Opened in error

## 2022-09-20 ENCOUNTER — Other Ambulatory Visit: Payer: Self-pay

## 2022-09-20 ENCOUNTER — Other Ambulatory Visit: Payer: Self-pay | Admitting: Oncology

## 2022-09-20 DIAGNOSIS — M159 Polyosteoarthritis, unspecified: Secondary | ICD-10-CM

## 2022-09-20 HISTORY — DX: Polyosteoarthritis, unspecified: M15.9

## 2022-09-20 MED ORDER — OXYCODONE-ACETAMINOPHEN 10-325 MG PO TABS: 1.0000 | ORAL_TABLET | ORAL | 0 refills | Status: DC | PRN

## 2022-09-20 NOTE — Telephone Encounter (Signed)
Yes [facility] on [address] does have Percocet 10-325 in stock. They came on the truck today.

## 2022-09-21 ENCOUNTER — Encounter: Payer: Self-pay | Admitting: Hematology and Oncology

## 2022-09-25 LAB — BASIC METABOLIC PANEL
BUN: 17 (ref 4–21)
CO2: 28 — AB (ref 13–22)
Chloride: 100 (ref 99–108)
Creatinine: 1.1 (ref 0.5–1.1)
Glucose: 91
Potassium: 3.9 mEq/L (ref 3.5–5.1)
Sodium: 137 (ref 137–147)

## 2022-09-25 LAB — HEPATIC FUNCTION PANEL
ALT: 14 U/L (ref 7–35)
AST: 24 (ref 13–35)
Alkaline Phosphatase: 53 (ref 25–125)
Bilirubin, Total: 0.3

## 2022-09-25 LAB — CBC AND DIFFERENTIAL
HCT: 34 — AB (ref 36–46)
Hemoglobin: 11.4 — AB (ref 12.0–16.0)
Neutrophils Absolute: 5.2
Platelets: 347 10*3/uL (ref 150–400)
WBC: 8

## 2022-09-25 LAB — COMPREHENSIVE METABOLIC PANEL
Albumin: 4.1 (ref 3.5–5.0)
Calcium: 9.2 (ref 8.7–10.7)

## 2022-09-25 LAB — CBC: RBC: 3.7 — AB (ref 3.87–5.11)

## 2022-09-25 NOTE — Progress Notes (Signed)
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 She was treated with concurrent radiation and chemotherapy, which included 6 cycles of carboplatin and Etoposide, that was completed in October 2014. She has restaging scans every 3 months that have remained stable.  A PET scan in March 2015 showed only mild to moderate metabolic activity in the area of her original tumor.  She does have degenerative disc disease and osteoarthritis with chronic bone pain.  We have agreed to prescribe pain medication for her as she is unable to obtain a primary care provider.  In addition, she has significant osteopenia and was placed on Fosamax along with calcium and vitamin-D.  She continues to complain of lower back pain, and rates it as an 8 and constant..  She states the Percocet 10/325 gives her relief but just for a short period of time.  In June of 2017, she was complaining of a catch under both breasts and some increased cough with occasional chest pain.  At that time her CT scan did show progressive collapse and consolidation of the left upper lobe and so a PET scan was done to evaluate this further.  This revealed mild hypermetabolism within the posterior left upper hemithorax consistent with radiation treatment changes and appeared relatively uniform with no areas of intense uptake suspicious for malignancy.  In December of 2017, she had a rib fracture that was found on x-ray at urgent care. She was also put on MS Contin '15mg'$  bid. She was seen by Dr. 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  She was placed on Protonix twice daily and had a follow-up upper endoscopy in July of 2018, which showed good healing of her ulcer, now consistent with an antral scar, and a small hiatal hernia. .  When I saw her in April, her hemoglobin was up to 11.3 but was back down to 9.1 on July 16th.  Biopsies revealed a reactive gastropathy and was negative for H pylori.  She is on iron supplement. She has had 2 myocardial infarctions, one in March of 2019 and another in June, and was transferred to [facility] in [place].  A chest x-ray done at that time showed just a chronic opacity at the left lung apex consistent with her treated lung cancer.  She also had an echocardiogram which revealed severe impairment of her left ventricular function with an ejection fraction of 30-35%.  She had akinesis of multiple areas of the myocardium.  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  It also revealed a small stable pericardial effusion.  ECHO from July 2021 revealed a good ejection fraction of 60-65% and small stable pericardial effusion.  CT chest from November 2021 revealed stable dense radiation fibrosis involving the left paramediastinal lung and left hilum with no findings suspicious for recurrent tumor. Annual mammogram from December 2021 was negative.   CT chest from September 26, 2021 reveals stable  dense radiation fibrosis involving the left upper lobe and left hilum. No findings suspicious for recurrent tumor. No mediastinal or hilar mass or adenopathy. Stable small bilateral adrenal gland nodules, likely benign adenomas.   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 Last year her hemoglobin was 11.0 and she was found to be folate deficient. Patient states that she is  ok and has no complaints. Patient has sleep apnea and now had a CPAP mask. Her CT scan from 09/25/2022 revealed post treatment changes in the left hemithorax without evidence of recurrent or metastatic disease. She has a moderate pericardial effusion, stable. Intrahepatic and extrahepatic bilary ductal dilation appears increased from 04/17/2022. I will schedule a CT abdomen scan now. Her chemistries are unremarkable. B-12, folate, iron, and CEA are good. Her hemoglobin has come up to 11.4 today. She remains with severe shortness of breath with mild exertion and pain in her lower back especially when walking, even with her walker. I will see her back in 6 months with CBC and CMP. She denies signs of infection such as sore throat, sinus drainage, cough, or urinary symptoms.  She denies fevers or recurrent chills. She denies pain. She denies nausea, vomiting, chest pain, dyspnea or cough. Her appetite is good and her weight has increased 5 pounds over last 2 months .  REVIEW OF SYSTEMS:  Review of Systems  Constitutional: Negative.  Negative for appetite change, chills, diaphoresis, fatigue, fever and unexpected weight change.  HENT:  Negative.  Negative for hearing loss, lump/mass, mouth sores, nosebleeds, sore throat, tinnitus, trouble swallowing and voice change.   Eyes: Negative.  Negative for eye problems and icterus.  Respiratory:  Positive for shortness of breath (severe even with mild exertion). Negative for chest tightness, cough, hemoptysis and wheezing.   Cardiovascular: Negative.  Negative for chest pain, leg swelling and palpitations.  Gastrointestinal: Negative.  Negative for abdominal distention, abdominal pain, blood in stool, constipation, diarrhea, nausea, rectal pain and vomiting.  Endocrine: Negative.   Genitourinary: Negative.  Negative for bladder incontinence, difficulty urinating, dyspareunia, dysuria, frequency, hematuria, menstrual problem, nocturia, pelvic pain, vaginal bleeding  and vaginal discharge.   Musculoskeletal:  Positive for back pain and gait problem (uses a walker to ambulate). Negative for arthralgias, flank pain, myalgias, neck pain and neck stiffness.  Skin: Negative.  Negative for itching, rash and wound.  Neurological:  Positive for extremity weakness and gait problem (uses a walker to ambulate). Negative for dizziness, headaches, light-headedness, numbness, seizures and speech difficulty.  Hematological: Negative.  Negative for adenopathy. Does not bruise/bleed easily.  Psychiatric/Behavioral: Negative.  Negative for confusion, decreased concentration, depression, sleep disturbance and suicidal ideas. The patient is not nervous/anxious.     VITALS:  Blood pressure (!) 149/77, pulse 93, temperature 98 F (36.7 C), temperature source Oral, resp. rate 20, height '5\' 6"'$  (1.676 m), weight 178 lb 8 oz (81 kg), SpO2 97 %.  Wt Readings from Last 3 Encounters:  09/27/22 178 lb 8 oz (81 kg)  07/16/22 173 lb 9.6 oz (78.7 kg)  05/29/22 170 lb (77.1 kg)    Body mass index is 28.81 kg/m.  Performance status (ECOG): 1 - Symptomatic but completely ambulatory  PHYSICAL EXAM:  Physical Exam Vitals and nursing note reviewed.  Constitutional:      General: She is not in acute distress.    Appearance: Normal appearance. She is normal weight. She is not ill-appearing,  toxic-appearing or diaphoretic.  HENT:     Head: Normocephalic and atraumatic.     Right Ear: Tympanic membrane, ear canal and external ear normal. There is no impacted cerumen.     Left Ear: Tympanic membrane, ear canal and external ear normal. There is no impacted cerumen.     Nose: Nose normal. No congestion or rhinorrhea.     Mouth/Throat:     Mouth: Mucous membranes are moist.     Pharynx: Oropharynx is clear. No oropharyngeal exudate or posterior oropharyngeal erythema.  Eyes:     General: No scleral icterus.       Right eye: No discharge.        Left eye: No discharge.     Extraocular  Movements: Extraocular movements intact.     Conjunctiva/sclera: Conjunctivae normal.     Pupils: Pupils are equal, round, and reactive to light.  Neck:     Vascular: No carotid bruit.  Cardiovascular:     Rate and Rhythm: Normal rate and regular rhythm.     Pulses: Normal pulses.     Heart sounds: Normal heart sounds. No murmur heard.    No friction rub. No gallop.  Pulmonary:     Effort: Pulmonary effort is normal. No respiratory distress.     Breath sounds: Normal breath sounds. No stridor. No wheezing, rhonchi or rales.  Chest:     Chest wall: No tenderness.  Abdominal:     General: Bowel sounds are normal. There is no distension.     Palpations: Abdomen is soft. There is no hepatomegaly, splenomegaly or mass.     Tenderness: There is no abdominal tenderness. There is no right CVA tenderness, left CVA tenderness, guarding or rebound.     Hernia: No hernia is present.  Musculoskeletal:        General: No swelling, tenderness, deformity or signs of injury. Normal range of motion.     Cervical back: Normal range of motion and neck supple. No rigidity or tenderness.     Right lower leg: No edema.     Left lower leg: No edema.  Lymphadenopathy:     Cervical: No cervical adenopathy.  Skin:    General: Skin is warm and dry.     Coloration: Skin is not jaundiced or pale.     Findings: No bruising, erythema, lesion or rash.  Neurological:     General: No focal deficit present.     Mental Status: She is alert and oriented to person, place, and time. Mental status is at baseline.     Cranial Nerves: No cranial nerve deficit.     Sensory: No sensory deficit.     Motor: No weakness.     Coordination: Coordination normal.     Gait: Gait normal.     Deep Tendon Reflexes: Reflexes normal.  Psychiatric:        Mood and Affect: Mood normal.        Behavior: Behavior normal.        Thought Content: Thought content normal.        Judgment: Judgment normal.    LABS:      Latest Ref  Rng & Units 09/25/2022   12:00 AM 03/27/2022   12:00 AM 03/06/2022   12:00 AM  CBC  WBC  8.0     8.0     7.5  C     Hemoglobin 12.0 - 16.0 11.4     11.3     11.0  Hematocrit 36 - 46 34     34     34      Platelets 150 - 400 K/uL 347     288     293        C Corrected result   This result is from an external source.      Latest Ref Rng & Units 10/04/2022    3:46 PM 10/04/2022    3:40 PM 09/25/2022   12:00 AM  CMP  Glucose 70 - 99 mg/dL  96    BUN 8 - 27 mg/dL  14  17      Creatinine 0.57 - 1.00 mg/dL  1.38  1.1      Sodium 134 - 144 mmol/L  135  137      Potassium 3.5 - 5.2 mmol/L  4.4  3.9      Chloride 96 - 106 mmol/L  97  100      CO2 20 - 29 mmol/L  23  28      Calcium 8.7 - 10.3 mg/dL  9.4  9.2      Alkaline Phos 25 - 125   53      AST 0 - 40 IU/L 17   24      ALT 0 - 32 IU/L 10   14         This result is from an external source.   Component Ref Range & Units 6 mo ago (03/27/22) 6 mo ago (03/06/22) 1 yr ago (09/24/21)  Iron 28 - 170 ug/dL 53 53 R 32 R  TIBC 250 - 450 ug/dL 314 285 R 296 R  Saturation Ratios 10.4 - 31.8 % 17    UIBC ug/dL 261 232 R 264 R       Component Ref Range & Units 6 mo ago  Folate >5.9 ng/mL 5.4 Low      Component Ref Range & Units 6 mo ago (03/27/22) 6 mo ago (03/06/22) 1 yr ago (09/24/21)  Ferritin 11 - 307 ng/mL 89 149 R 324 R   omponent Ref Range & Units 6 mo ago (03/27/22) 6 mo ago (03/06/22) 1 yr ago (09/24/21)  Vitamin B-12 180 - 914 pg/mL 418 349 R 600 R   Lab Results  Component Value Date   CEA1 2.1 03/27/2022   CEA 1.3 09/25/2022   /  CEA  Date Value Ref Range Status  09/25/2022 1.3  Final  03/27/2022 2.1 0.0 - 4.7 ng/mL Final    Comment:    (NOTE)                             Nonsmokers          <3.9                             Smokers             <5.6 [employer] Electrochemiluminescence Immunoassay (ECLIA) Values obtained with different assay methods or kits cannot be used interchangeably.  Results cannot  be interpreted as absolute evidence of the presence or absence of malignant disease. Performed At: [facility] [place], [place] [id number] Rush Farmer MD [id number]    STUDIES:  Exam:  09/25/2022 Ct Chest with Contrast Impression: Post treatment changes in the left hemithorax without evidence of recurrent or metastatic disease. Moderate pericardial effusion, stable. Intrahepatic and  extrahepatic bilary ductal dilation appears increased from 04/17/2022. Please correlate clinically and consider right upper quadrant ultrasound or CT abdomen with contrast in further evaluation, as clinically indicated.  Aortic Atherosclerosis (ICD10-170.0) Coronary Artery calcification.  Emphysema (ICD10-J43.9)  EXAM: 09/26/2021 CT CHEST WITH CONTRAST  IMPRESSION:  1. Stable dense radiation fibrosis involving the left upper lobe and  left hilum. No findings suspicious for recurrent tumor.  2. No mediastinal or hilar mass or adenopathy.  3. Stable 3 mm subpleural pulmonary nodule in the right lower lobe.  4. Stable advanced vascular calcifications.  5. Stable small bilateral adrenal gland nodules, likely benign  adenomas.    Allergies: No Known Allergies  Current Medications: Current Outpatient Medications  Medication Sig Dispense Refill   albuterol (PROVENTIL) (2.5 MG/3ML) 0.083% nebulizer solution Take 2.5 mg by nebulization every 6 (six) hours as needed for wheezing or shortness of breath.     albuterol (VENTOLIN HFA) 108 (90 Base) MCG/ACT inhaler Inhale 2 puffs into the lungs as needed for shortness of breath.     buPROPion (WELLBUTRIN SR) 150 MG 12 hr tablet Take 150 mg by mouth 2 (two) times daily.     busPIRone (BUSPAR) 15 MG tablet Take 15 mg by mouth daily.     Cholecalciferol (VITAMIN D3) 1.25 MG (50000 UT) TABS Take 1 tablet by mouth once a week.     clopidogrel (PLAVIX) 75 MG tablet Take 1 tablet (75 mg total) by mouth daily. 30 tablet 6   Cyanocobalamin  (VITAMIN B-12 PO) Take 1 tablet by mouth daily.     dicyclomine (BENTYL) 10 MG capsule Take 10 mg by mouth every 6 (six) hours.     doxepin (SINEQUAN) 10 MG capsule Take 10 mg by mouth daily.     doxylamine, Sleep, (UNISOM) 25 MG tablet Take 25 mg by mouth at bedtime as needed for sleep.     ezetimibe (ZETIA) 10 MG tablet Take 1 tablet (10 mg total) by mouth daily. 30 tablet 6   Ferrous Sulfate (FEROSUL PO) Take 325 mg by mouth daily.     Ferrous Sulfate (IRON PO) Take 1 tablet by mouth daily.     folic acid (FOLVITE) 1 MG tablet Take 1 tablet (1 mg total) by mouth daily. 30 tablet 5   furosemide (LASIX) 20 MG tablet Take 1 tablet (20 mg total) by mouth daily. 90 tablet 3   ipratropium-albuterol (DUONEB) 0.5-2.5 (3) MG/3ML SOLN Take 3 mLs by nebulization every 6 (six) hours as needed (SOB or wheezing).     lubiprostone (AMITIZA) 24 MCG capsule Take 24 mcg by mouth 2 (two) times daily with a meal.     metoprolol succinate (TOPROL-XL) 25 MG 24 hr tablet TAKE 1 TABLET BY MOUTH ONCE DAILY. (Patient taking differently: Take 25 mg by mouth daily.) 30 tablet 0   metoprolol succinate (TOPROL-XL) 25 MG 24 hr tablet Take 25 mg by mouth daily.     morphine (MS CONTIN) 15 MG 12 hr tablet Take 1 tablet (15 mg total) by mouth every 12 (twelve) hours. 60 tablet 0   naloxegol oxalate (MOVANTIK) 25 MG TABS tablet Take 25 mg by mouth daily.     nitroGLYCERIN (NITROSTAT) 0.4 MG SL tablet Place 1 tablet (0.4 mg total) under the tongue every 5 (five) minutes x 3 doses as needed for chest pain. 25 tablet 12   ondansetron (ZOFRAN ODT) 4 MG disintegrating tablet '4mg'$  ODT q4 hours prn nausea/vomit (Patient taking differently: Take 4 mg by mouth every 4 (four)  hours as needed for nausea or vomiting. '4mg'$  ODT q4 hours prn nausea/vomit) 8 tablet 0   oxyCODONE-acetaminophen (PERCOCET) 10-325 MG tablet Take 1 tablet by mouth every 4 (four) hours as needed for pain. 120 tablet 0   pantoprazole (PROTONIX) 40 MG tablet Take 40 mg by  mouth 2 (two) times daily.     polyethylene glycol (MIRALAX / GLYCOLAX) packet Take 17 g by mouth daily.     potassium chloride (K-DUR) 10 MEQ tablet Take 10 mEq by mouth daily.     prochlorperazine (COMPAZINE) 10 MG tablet Take 10 mg by mouth 3 (three) times daily as needed for nausea/vomiting, nausea or vomiting.     rosuvastatin (CRESTOR) 20 MG tablet Take 1 tablet (20 mg total) by mouth daily. 90 tablet 3   tiZANidine (ZANAFLEX) 4 MG tablet Take 4 mg by mouth at bedtime as needed for muscle spasms.     TRELEGY ELLIPTA 200-62.5-25 MCG/ACT AEPB Inhale 1 puff into the lungs daily.     Vitamin D, Ergocalciferol, (DRISDOL) 50000 units CAPS capsule Take 50,000 Units by mouth every 7 (seven) days.     No current facility-administered medications for this visit.     ASSESSMENT & PLAN:   Assessment:   1.  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  This was treated with chemotherapy and radiation.  She remains without evidence of disease for nearly 10 years.    2.  Tiny stable pulmonary nodules, stable.  3.  Chronic back pain from severe degenerative disc disease and osteoarthritis.  She continues oxycodone 10/325 with fairly good control of her pain.  4.  Stable, moderate pericardial effusion.  ECHO from July 2021 revealed an EF between 60-65%.  Her cardiologist Dr. Agustin Cree ordered a diuretic.  5.  COPD with severe dyspnea. She finally quit smoking in November 2022.  Dr. Alcide Clever has diagnosed sleep apnea and she now uses a CPAP mask.  6.  Anemia.  This is mild and normochromic, normocytic. It has improved somewhat with folate supplement.  7. Biliary Ductal Dilatation. We will evaluate this with CT abdomen. She tells me she has had problems with gallstones in the past and may need to see Dr. Lyndel Safe again.  Plan: CT of the chest shows no evidence of small cell lung cancer nearly 10 years out. I will schedule her a CT abdomen scan now to evaluate her  biliary ductal dilatation. Her chemistries are unremarkable. B-12, folate, iron, and CEA are good. She still has severe shortness of breath with mild exertion and pain in her lower back especially when walking even with her walker. I will see her back in 6 months with CBC, CMP and port flush.  She understands and agrees with this plan of care.  I provided 25 minutes of face-to-face time during this this encounter and > 50% was spent counseling as documented under my assessment and plan.    Derwood Kaplan, MD [facility] [address] [place] [address] Dept: [phone] Dept Fax: [phone]    Sumner Boast Lassiter,acting as a scribe for Derwood Kaplan, MD.,have documented all relevant documentation on the behalf of Derwood Kaplan, MD,as directed by  Derwood Kaplan, MD while in the presence of Derwood Kaplan, MD.

## 2022-09-27 ENCOUNTER — Encounter: Payer: Self-pay | Admitting: Oncology

## 2022-09-27 ENCOUNTER — Inpatient Hospital Stay: Payer: 59 | Attending: Oncology | Admitting: Oncology

## 2022-09-27 ENCOUNTER — Other Ambulatory Visit: Payer: Self-pay | Admitting: Oncology

## 2022-09-27 VITALS — BP 149/77 | HR 93 | Temp 98.0°F | Resp 20 | Ht 66.0 in | Wt 178.5 lb

## 2022-09-27 DIAGNOSIS — C3490 Malignant neoplasm of unspecified part of unspecified bronchus or lung: Secondary | ICD-10-CM | POA: Diagnosis not present

## 2022-09-27 DIAGNOSIS — K838 Other specified diseases of biliary tract: Secondary | ICD-10-CM

## 2022-09-27 DIAGNOSIS — C3412 Malignant neoplasm of upper lobe, left bronchus or lung: Secondary | ICD-10-CM

## 2022-09-30 LAB — CEA: CEA: 1.3

## 2022-09-30 LAB — VITAMIN B12: VITAMIN B12: 713

## 2022-09-30 LAB — IRON AND TIBC
Iron: 75
Saturation Ratios: 24.6
TIBC: 304

## 2022-09-30 LAB — FOLATE: Folate: 17.5

## 2022-09-30 LAB — SOLUBLE TRANSFERRIN RECEPTOR: Transferrin Receptor, Soluble: 18.7

## 2022-09-30 LAB — FERRITIN: Ferritin: 99.9

## 2022-10-01 ENCOUNTER — Telehealth: Payer: Self-pay | Admitting: Oncology

## 2022-10-01 NOTE — Telephone Encounter (Signed)
CT Abdomen has been scheduled for 10/04/22 @ 3PM; Check in @ 2:30 PM   LVM notifying pt's daughter of date,time and instructions.

## 2022-10-02 ENCOUNTER — Other Ambulatory Visit: Payer: Self-pay

## 2022-10-02 DIAGNOSIS — Z8739 Personal history of other diseases of the musculoskeletal system and connective tissue: Secondary | ICD-10-CM

## 2022-10-02 DIAGNOSIS — M479 Spondylosis, unspecified: Secondary | ICD-10-CM

## 2022-10-02 DIAGNOSIS — G8929 Other chronic pain: Secondary | ICD-10-CM

## 2022-10-02 MED ORDER — MORPHINE SULFATE ER 15 MG PO TBCR: 15.0000 mg | EXTENDED_RELEASE_TABLET | Freq: Two times a day (BID) | ORAL | 0 refills | Status: DC

## 2022-10-04 ENCOUNTER — Telehealth: Payer: Self-pay | Admitting: Cardiology

## 2022-10-04 DIAGNOSIS — I700 Atherosclerosis of aorta: Secondary | ICD-10-CM | POA: Diagnosis not present

## 2022-10-04 DIAGNOSIS — E785 Hyperlipidemia, unspecified: Secondary | ICD-10-CM | POA: Diagnosis not present

## 2022-10-04 DIAGNOSIS — I5022 Chronic systolic (congestive) heart failure: Secondary | ICD-10-CM | POA: Diagnosis not present

## 2022-10-04 DIAGNOSIS — K839 Disease of biliary tract, unspecified: Secondary | ICD-10-CM | POA: Diagnosis not present

## 2022-10-04 DIAGNOSIS — K838 Other specified diseases of biliary tract: Secondary | ICD-10-CM | POA: Diagnosis not present

## 2022-10-04 MED ORDER — FUROSEMIDE 20 MG PO TABS: 20.0000 mg | ORAL_TABLET | Freq: Every day | ORAL | 3 refills | Status: DC

## 2022-10-04 NOTE — Telephone Encounter (Signed)
  Pt c/o swelling: STAT is pt has developed SOB within 24 hours  If swelling, where is the swelling located? Both feet  How much weight have you gained and in what time span? Not sure  Have you gained 3 pounds in a day or 5 pounds in a week? Not sure  Do you have a log of your daily weights (if so, list)? Not sure  Are you currently taking a fluid pill? Yes   Are you currently SOB? Some with exertion   Have you traveled recently? No     Pt's daughter calling, she said, pt's feet are swelling and even though pt is taking fluid pill it seem like its not working. They would like to know if pt needs to see Dr. Raliegh Ip

## 2022-10-04 NOTE — Telephone Encounter (Signed)
Pt does not weigh herself daily but has been advised to start as it is very important when adjusting fluid medication. Pt states that her feet are very swollen ans have started up her legs. Pt states they are so swollen they hurt. Advised to make sure she is not taking extra salt. Pt states that she increased to 20 mg lasix and did not see much change. BP 190/102. Advised to take 40 mg today and tomorrow or until her swelling decreases or weight returns to her normal and then return to her normal dose. Pt is coming in today for BMP and ProBNP. Pt denies shortness of breath at this time. Pt verbalized understanding and had no additional questions.

## 2022-10-05 LAB — BASIC METABOLIC PANEL
BUN/Creatinine Ratio: 10 — ABNORMAL LOW (ref 12–28)
BUN: 14 mg/dL (ref 8–27)
CO2: 23 mmol/L (ref 20–29)
Calcium: 9.4 mg/dL (ref 8.7–10.3)
Chloride: 97 mmol/L (ref 96–106)
Creatinine, Ser: 1.38 mg/dL — ABNORMAL HIGH (ref 0.57–1.00)
Glucose: 96 mg/dL (ref 70–99)
Potassium: 4.4 mmol/L (ref 3.5–5.2)
Sodium: 135 mmol/L (ref 134–144)
eGFR: 40 mL/min/{1.73_m2} — ABNORMAL LOW (ref >59)

## 2022-10-05 LAB — LIPID PANEL
Chol/HDL Ratio: 4.1 ratio (ref 0.0–4.4)
Cholesterol, Total: 204 mg/dL — ABNORMAL HIGH (ref 100–199)
HDL: 50 mg/dL (ref >39)
LDL Chol Calc (NIH): 114 mg/dL — ABNORMAL HIGH (ref 0–99)
Triglycerides: 230 mg/dL — ABNORMAL HIGH (ref 0–149)
VLDL Cholesterol Cal: 40 mg/dL (ref 5–40)

## 2022-10-05 LAB — PROTIME-INR
INR: 1 (ref 0.9–1.2)
Prothrombin Time: 10.8 s (ref 9.1–12.0)

## 2022-10-05 LAB — AST: AST: 17 IU/L (ref 0–40)

## 2022-10-05 LAB — ALT: ALT: 10 IU/L (ref 0–32)

## 2022-10-07 DIAGNOSIS — G4733 Obstructive sleep apnea (adult) (pediatric): Secondary | ICD-10-CM | POA: Diagnosis not present

## 2022-10-07 NOTE — Telephone Encounter (Signed)
Patient's daughter is calling to find out if patient is to continue taking the same amount of fluid pill since patient is no longer having swelling. Please advise.

## 2022-10-07 NOTE — Telephone Encounter (Signed)
Spoke with daughter Maudie Mercury per [place]. She was asking about Lasix dose after her swelling has resolved. Advised per Original message to take increased dose until her swelling resolves and her weight comes back to normal. Pt's daughter verbalized understanding and had no further questions.

## 2022-10-08 ENCOUNTER — Encounter: Payer: Self-pay | Admitting: Oncology

## 2022-10-10 ENCOUNTER — Encounter: Payer: Self-pay | Admitting: Hematology and Oncology

## 2022-10-10 ENCOUNTER — Encounter: Payer: Self-pay | Admitting: Oncology

## 2022-10-10 ENCOUNTER — Telehealth: Payer: Self-pay

## 2022-10-10 DIAGNOSIS — E782 Mixed hyperlipidemia: Secondary | ICD-10-CM

## 2022-10-10 NOTE — Telephone Encounter (Signed)
Spoke with daughter Maudie Mercury- per DPR-advised per Dr. Wendy Poet note to increase Crestor to '40mg'$  daily and follow up with labs in 6 weeks. Daughter agreed and verbalized understanding. Routed to PCP

## 2022-10-16 ENCOUNTER — Other Ambulatory Visit: Payer: Self-pay

## 2022-10-16 ENCOUNTER — Encounter: Payer: Self-pay | Admitting: Oncology

## 2022-10-16 DIAGNOSIS — M159 Polyosteoarthritis, unspecified: Secondary | ICD-10-CM

## 2022-10-16 MED ORDER — OXYCODONE-ACETAMINOPHEN 10-325 MG PO TABS: 1.0000 | ORAL_TABLET | ORAL | 0 refills | Status: DC | PRN

## 2022-10-17 ENCOUNTER — Encounter: Payer: Self-pay | Admitting: Hematology and Oncology

## 2022-10-19 ENCOUNTER — Encounter: Payer: Self-pay | Admitting: Hematology and Oncology

## 2022-10-25 DIAGNOSIS — G4733 Obstructive sleep apnea (adult) (pediatric): Secondary | ICD-10-CM | POA: Diagnosis not present

## 2022-11-07 ENCOUNTER — Other Ambulatory Visit: Payer: Self-pay | Admitting: Oncology

## 2022-11-07 ENCOUNTER — Other Ambulatory Visit: Payer: Self-pay

## 2022-11-07 DIAGNOSIS — J700 Acute pulmonary manifestations due to radiation: Secondary | ICD-10-CM | POA: Diagnosis not present

## 2022-11-07 DIAGNOSIS — Z8739 Personal history of other diseases of the musculoskeletal system and connective tissue: Secondary | ICD-10-CM

## 2022-11-07 DIAGNOSIS — M479 Spondylosis, unspecified: Secondary | ICD-10-CM

## 2022-11-07 DIAGNOSIS — G8929 Other chronic pain: Secondary | ICD-10-CM

## 2022-11-07 DIAGNOSIS — Z87891 Personal history of nicotine dependence: Secondary | ICD-10-CM | POA: Diagnosis not present

## 2022-11-07 DIAGNOSIS — J4550 Severe persistent asthma, uncomplicated: Secondary | ICD-10-CM | POA: Diagnosis not present

## 2022-11-07 DIAGNOSIS — G4733 Obstructive sleep apnea (adult) (pediatric): Secondary | ICD-10-CM | POA: Diagnosis not present

## 2022-11-07 MED ORDER — MORPHINE SULFATE ER 15 MG PO TBCR
15.0000 mg | EXTENDED_RELEASE_TABLET | Freq: Two times a day (BID) | ORAL | 0 refills | Status: DC
Start: 2022-11-07 — End: 2022-12-04

## 2022-11-11 ENCOUNTER — Telehealth: Payer: Self-pay

## 2022-11-11 NOTE — Patient Outreach (Signed)
  Care Coordination   Initial Visit Note   11/11/2022 Name: Jeweliana Brungard MRN: [id number] DOB: [birth year]  Aracelli Kozan is a 77 y.o. year old female who sees Potts, Johnney Killian, NP for primary care. I spoke with  Carlyon Shadow by phone today. Spoke with daughter via phone for patient.   What matters to the patients health and wellness today?  Placed call to patient to review and offer [facility] care coordination program.  Daughter on consent.   Daughter reports that patient is doing well and denies any needs at this time.     SDOH assessments and interventions completed:  No     Care Coordination Interventions:  No, not indicated   Follow up plan: No further intervention required.   Encounter Outcome:  Pt. Refused   Rowe Pavy, RN, BSN, CEN [facility] [phone]

## 2022-11-13 ENCOUNTER — Other Ambulatory Visit: Payer: Self-pay

## 2022-11-13 DIAGNOSIS — M159 Polyosteoarthritis, unspecified: Secondary | ICD-10-CM

## 2022-11-13 DIAGNOSIS — G8929 Other chronic pain: Secondary | ICD-10-CM

## 2022-11-13 MED ORDER — OXYCODONE-ACETAMINOPHEN 10-325 MG PO TABS
1.0000 | ORAL_TABLET | ORAL | 0 refills | Status: DC | PRN
Start: 2022-11-13 — End: 2022-12-10

## 2022-12-02 DIAGNOSIS — Z1231 Encounter for screening mammogram for malignant neoplasm of breast: Secondary | ICD-10-CM | POA: Diagnosis not present

## 2022-12-04 ENCOUNTER — Other Ambulatory Visit: Payer: Self-pay

## 2022-12-04 DIAGNOSIS — G8929 Other chronic pain: Secondary | ICD-10-CM

## 2022-12-04 DIAGNOSIS — G4733 Obstructive sleep apnea (adult) (pediatric): Secondary | ICD-10-CM | POA: Diagnosis not present

## 2022-12-04 DIAGNOSIS — M479 Spondylosis, unspecified: Secondary | ICD-10-CM

## 2022-12-04 DIAGNOSIS — Z8739 Personal history of other diseases of the musculoskeletal system and connective tissue: Secondary | ICD-10-CM

## 2022-12-04 MED ORDER — MORPHINE SULFATE ER 15 MG PO TBCR
15.0000 mg | EXTENDED_RELEASE_TABLET | Freq: Two times a day (BID) | ORAL | 0 refills | Status: DC
Start: 2022-12-04 — End: 2023-01-06

## 2022-12-07 DIAGNOSIS — G4733 Obstructive sleep apnea (adult) (pediatric): Secondary | ICD-10-CM | POA: Diagnosis not present

## 2022-12-10 ENCOUNTER — Other Ambulatory Visit: Payer: Self-pay

## 2022-12-10 DIAGNOSIS — G8929 Other chronic pain: Secondary | ICD-10-CM

## 2022-12-10 DIAGNOSIS — M159 Polyosteoarthritis, unspecified: Secondary | ICD-10-CM

## 2022-12-10 MED ORDER — OXYCODONE-ACETAMINOPHEN 10-325 MG PO TABS
1.0000 | ORAL_TABLET | ORAL | 0 refills | Status: DC | PRN
Start: 2022-12-10 — End: 2023-01-08

## 2022-12-12 DIAGNOSIS — R928 Other abnormal and inconclusive findings on diagnostic imaging of breast: Secondary | ICD-10-CM | POA: Diagnosis not present

## 2022-12-12 DIAGNOSIS — R921 Mammographic calcification found on diagnostic imaging of breast: Secondary | ICD-10-CM | POA: Diagnosis not present

## 2022-12-17 DIAGNOSIS — G4700 Insomnia, unspecified: Secondary | ICD-10-CM | POA: Diagnosis not present

## 2022-12-17 DIAGNOSIS — E559 Vitamin D deficiency, unspecified: Secondary | ICD-10-CM | POA: Diagnosis not present

## 2022-12-17 DIAGNOSIS — E538 Deficiency of other specified B group vitamins: Secondary | ICD-10-CM | POA: Diagnosis not present

## 2022-12-17 DIAGNOSIS — C3490 Malignant neoplasm of unspecified part of unspecified bronchus or lung: Secondary | ICD-10-CM | POA: Diagnosis not present

## 2022-12-17 DIAGNOSIS — K5909 Other constipation: Secondary | ICD-10-CM | POA: Diagnosis not present

## 2022-12-17 DIAGNOSIS — J439 Emphysema, unspecified: Secondary | ICD-10-CM | POA: Diagnosis not present

## 2022-12-17 DIAGNOSIS — E785 Hyperlipidemia, unspecified: Secondary | ICD-10-CM | POA: Diagnosis not present

## 2023-01-06 ENCOUNTER — Other Ambulatory Visit: Payer: Self-pay

## 2023-01-06 DIAGNOSIS — R921 Mammographic calcification found on diagnostic imaging of breast: Secondary | ICD-10-CM | POA: Diagnosis not present

## 2023-01-06 DIAGNOSIS — R928 Other abnormal and inconclusive findings on diagnostic imaging of breast: Secondary | ICD-10-CM | POA: Diagnosis not present

## 2023-01-06 DIAGNOSIS — N6011 Diffuse cystic mastopathy of right breast: Secondary | ICD-10-CM | POA: Diagnosis not present

## 2023-01-06 DIAGNOSIS — N289 Disorder of kidney and ureter, unspecified: Secondary | ICD-10-CM | POA: Diagnosis not present

## 2023-01-06 DIAGNOSIS — M479 Spondylosis, unspecified: Secondary | ICD-10-CM

## 2023-01-06 DIAGNOSIS — G8929 Other chronic pain: Secondary | ICD-10-CM

## 2023-01-06 DIAGNOSIS — Z8739 Personal history of other diseases of the musculoskeletal system and connective tissue: Secondary | ICD-10-CM

## 2023-01-06 MED ORDER — MORPHINE SULFATE ER 15 MG PO TBCR
15.0000 mg | EXTENDED_RELEASE_TABLET | Freq: Two times a day (BID) | ORAL | 0 refills | Status: DC
Start: 2023-01-06 — End: 2023-02-04

## 2023-01-08 ENCOUNTER — Other Ambulatory Visit: Payer: Self-pay

## 2023-01-08 DIAGNOSIS — M159 Polyosteoarthritis, unspecified: Secondary | ICD-10-CM

## 2023-01-08 DIAGNOSIS — G8929 Other chronic pain: Secondary | ICD-10-CM

## 2023-01-08 MED ORDER — OXYCODONE-ACETAMINOPHEN 10-325 MG PO TABS
1.0000 | ORAL_TABLET | ORAL | 0 refills | Status: DC | PRN
Start: 2023-01-08 — End: 2023-02-03

## 2023-01-09 DIAGNOSIS — G4733 Obstructive sleep apnea (adult) (pediatric): Secondary | ICD-10-CM | POA: Diagnosis not present

## 2023-01-14 ENCOUNTER — Ambulatory Visit: Payer: 59 | Attending: Cardiology | Admitting: Cardiology

## 2023-01-14 ENCOUNTER — Encounter: Payer: Self-pay | Admitting: Cardiology

## 2023-01-14 VITALS — BP 124/84 | HR 64 | Ht 66.0 in | Wt 179.4 lb

## 2023-01-14 DIAGNOSIS — I255 Ischemic cardiomyopathy: Secondary | ICD-10-CM

## 2023-01-14 DIAGNOSIS — I2510 Atherosclerotic heart disease of native coronary artery without angina pectoris: Secondary | ICD-10-CM | POA: Diagnosis not present

## 2023-01-14 DIAGNOSIS — I5042 Chronic combined systolic (congestive) and diastolic (congestive) heart failure: Secondary | ICD-10-CM

## 2023-01-14 DIAGNOSIS — J449 Chronic obstructive pulmonary disease, unspecified: Secondary | ICD-10-CM

## 2023-01-14 DIAGNOSIS — I5021 Acute systolic (congestive) heart failure: Secondary | ICD-10-CM

## 2023-01-14 DIAGNOSIS — R0609 Other forms of dyspnea: Secondary | ICD-10-CM

## 2023-01-14 NOTE — Progress Notes (Unsigned)
Cardiology Office Note:    Date:  01/14/2023   ID:  Ashley Miles, DOB Apr 20, 1946, MRN [id number]  PCP:  Jim Like, NP  Cardiologist:  Gypsy Balsam, MD    Referring MD: Jim Like, NP   Chief Complaint  Patient presents with   Follow-up    History of Present Illness:    Ashley Miles is a 77 y.o. female past medical history significant for coronary artery disease, status post non-STEMI in 2019, she did have diminished left ventricle ejection fraction however normalization there was also some issue with noncompliance she disappeared from follow-up for a long time she ended up going back to the hospital last year because of TIA workup has been done which showed up to 79% stenosis of both carotids however those were repeated in the office show only stenosis up to 49%.  She comes today to months for follow-up overall doing well.  She described to have COVID some swelling of lower extremities for which she took extra Lasix has been taking 20 mg of Lasix twice daily legs are looking much better today.  No chest pain tightness squeezing pressure burning chest  Past Medical History:  Diagnosis Date   Acute blood loss anemia    Acute kidney failure (HCC)    Acute systolic heart failure (HCC)    Anemia, unspecified 07/11/2020   Anxiety    Arthritis    Cardiogenic shock (HCC)    CHF (congestive heart failure) (HCC)    Chronic systolic heart failure (HCC) 03/03/2017   Cardiac catheterization 2018 showed diagonal branch occluded and right coronary artery stenosis however right coronary artery was very small, the feeling was that degree of coronary artery disease is low to her ejection fraction.   COPD (chronic obstructive pulmonary disease) (HCC)    Coronary artery disease 03/03/2017   Cardiac catheterization June 2018 showing 90% small RCA, completely occluded diagonal branch. Unsuccessful attempt to open up diagonal branch.   Drug-induced constipation    Dyslipidemia  04/03/2017   Gastric ulcer    GERD (gastroesophageal reflux disease)    Hiatal hernia    Hyperlipidemia    Hypertension    Ischemic cardiomyopathy 05/21/2018   Lung cancer (HCC) 03/03/2017   NSTEMI (non-ST elevated myocardial infarction) (HCC) 01/09/2017   Osteopenia    Small cell lung cancer (HCC)    Smoking 11/20/2017   T12 compression fracture (HCC)    Tobacco use     Past Surgical History:  Procedure Laterality Date   CESAREAN SECTION     ESOPHAGOGASTRODUODENOSCOPY  02/17/2017   Healing of the gastic ulcer- no antral scar. Small hiatal hernia   LEFT HEART CATH AND CORONARY ANGIOGRAPHY N/A 01/09/2017   Procedure: Left Heart Cath and Coronary Angiography;  Surgeon: Kathleene Hazel, MD;  Location: [facility] INVASIVE CV LAB;  Service: Cardiovascular;  Laterality: N/A;   LUNG CANCER SURGERY  2014    Current Medications: Current Meds  Medication Sig   albuterol (PROVENTIL) (2.5 MG/3ML) 0.083% nebulizer solution Take 2.5 mg by nebulization every 6 (six) hours as needed for wheezing or shortness of breath.   albuterol (VENTOLIN HFA) 108 (90 Base) MCG/ACT inhaler Inhale 2 puffs into the lungs as needed for shortness of breath.   clopidogrel (PLAVIX) 75 MG tablet Take 1 tablet (75 mg total) by mouth daily.   Cyanocobalamin (VITAMIN B-12 PO) Take 1 tablet by mouth daily.   doxepin (SINEQUAN) 10 MG capsule Take 10 mg by mouth daily.   doxylamine, Sleep, (  UNISOM) 25 MG tablet Take 25 mg by mouth at bedtime as needed for sleep.   ezetimibe (ZETIA) 10 MG tablet Take 1 tablet (10 mg total) by mouth daily.   Ferrous Sulfate (FEROSUL PO) Take 325 mg by mouth daily.   folic acid (FOLVITE) 1 MG tablet Take 1 tablet (1 mg total) by mouth daily.   furosemide (LASIX) 20 MG tablet Take 1 tablet (20 mg total) by mouth daily.   ipratropium-albuterol (DUONEB) 0.5-2.5 (3) MG/3ML SOLN Take 3 mLs by nebulization every 6 (six) hours as needed (SOB or wheezing).   lubiprostone (AMITIZA) 24 MCG capsule Take 24  mcg by mouth 2 (two) times daily with a meal.   metoprolol succinate (TOPROL-XL) 25 MG 24 hr tablet Take 25 mg by mouth daily.   morphine (MS CONTIN) 15 MG 12 hr tablet Take 1 tablet (15 mg total) by mouth every 12 (twelve) hours.   traZODone (DESYREL) 50 MG tablet Take 50 mg by mouth at bedtime.     Allergies:   Patient has no known allergies.   Social History   Socioeconomic History   Marital status: Divorced    Spouse name: Not on file   Number of children: Not on file   Years of education: Not on file   Highest education level: Not on file  Occupational History   Not on file  Tobacco Use   Smoking status: Former    Types: Cigarettes    Quit date: 06/05/2021    Years since quitting: 1.6   Smokeless tobacco: Never  Vaping Use   Vaping Use: Former  Substance and Sexual Activity   Alcohol use: No   Drug use: No   Sexual activity: Not on file  Other Topics Concern   Not on file  Social History Narrative   Not on file   Social Determinants of Health   Financial Resource Strain: Not on file  Food Insecurity: Not on file  Transportation Needs: Not on file  Physical Activity: Not on file  Stress: Not on file  Social Connections: Not on file     Family History: The patient's family history includes Blindness in her mother; Cancer in her father; Hypertension in her father. There is no history of Colon cancer or Esophageal cancer. ROS:   Please see the history of present illness.    All 14 point review of systems negative except as described per history of present illness  EKGs/Labs/Other Studies Reviewed:      Recent Labs: 03/06/2022: B Natriuretic Peptide 267.0 09/25/2022: Hemoglobin 11.4; Platelets 347 10/04/2022: ALT 10; BUN 14; Creatinine, Ser 1.38; Potassium 4.4; Sodium 135  Recent Lipid Panel    Component Value Date/Time   CHOL 204 (H) 10/04/2022 1546   TRIG 230 (H) 10/04/2022 1546   HDL 50 10/04/2022 1546   CHOLHDL 4.1 10/04/2022 1546   CHOLHDL 5.0  01/11/2017 0521   VLDL 27 01/11/2017 0521   LDLCALC 114 (H) 10/04/2022 1546    Physical Exam:    VS:  BP 124/84 (BP Location: Left Arm, Patient Position: Sitting, Cuff Size: Normal)   Pulse 64   Ht 5\' 6"  (1.676 m)   Wt 179 lb 6.4 oz (81.4 kg)   SpO2 96%   BMI 28.96 kg/m     Wt Readings from Last 3 Encounters:  01/14/23 179 lb 6.4 oz (81.4 kg)  09/27/22 178 lb 8 oz (81 kg)  07/16/22 173 lb 9.6 oz (78.7 kg)     GEN:  Well nourished,  well developed in no acute distress HEENT: Normal NECK: No JVD; No carotid bruits LYMPHATICS: No lymphadenopathy CARDIAC: RRR, no murmurs, no rubs, no gallops RESPIRATORY:  Clear to auscultation without rales, wheezing or rhonchi  ABDOMEN: Soft, non-tender, non-distended MUSCULOSKELETAL:  No edema; No deformity  SKIN: Warm and dry LOWER EXTREMITIES: no swelling NEUROLOGIC:  Alert and oriented x 3 PSYCHIATRIC:  Normal affect   ASSESSMENT:    1. Ischemic cardiomyopathy   2. Coronary artery disease involving native coronary artery of native heart without angina pectoris   3. Acute systolic heart failure (HCC)   4. Chronic combined systolic and diastolic congestive heart failure (HCC)   5. Dyspnea on exertion   6. Chronic obstructive pulmonary disease, unspecified COPD type (HCC)    PLAN:    In order of problems listed above:  Ischemic cardiomyopathy.  Will repeat echocardiogram to recheck left ventricle ejection fraction, I will check Chem-7 as well as proBNP.  Will continue to milligrams Lasix twice daily until we get results of her blood test. Coronary disease denies have any issues right now.  No chest pain tightness squeezing pressure burning chest on antiplatelet therapy which I will continue. Dyslipidemia I did review K PN which show me data from May of this year with LDL 82 HDL 49 she is on Zetia 10 as well as Crestor 20.  Will increase Crestor to 40 mg daily. Dyspnea on exertion multifactorial probably COPD play significant role here  but will check echocardiogram make sure she does not have any significant cardiomyopathy   Medication Adjustments/Labs and Tests Ordered: Current medicines are reviewed at length with the patient today.  Concerns regarding medicines are outlined above.  Orders Placed This Encounter  Procedures   Basic metabolic panel   Pro b natriuretic peptide (BNP)   Medication changes: No orders of the defined types were placed in this encounter.   Signed, Georgeanna Lea, MD, [facility] 01/14/2023 4:50 PM    [facility]

## 2023-01-14 NOTE — Patient Instructions (Signed)
Medication Instructions:  Your physician recommends that you continue on your current medications as directed. Please refer to the Current Medication list given to you today.  *If you need a refill on your cardiac medications before your next appointment, please call your pharmacy*   Lab Work: You need to have labs done when you are fasting.  You can come Monday through Friday 8:00 am to 12:00 pm and 1:15 to 4:30. You do not need to make an appointment as the order has already been placed. The labs you are going to have done are BMET, ProBNP.    Testing/Procedures: Your physician has requested that you have an echocardiogram. Echocardiography is a painless test that uses sound waves to create images of your heart. It provides your doctor with information about the size and shape of your heart and how well your heart's chambers and valves are working. This procedure takes approximately one hour. There are no restrictions for this procedure. Please do NOT wear cologne, perfume, aftershave, or lotions (deodorant is allowed). Please arrive 15 minutes prior to your appointment time.    Follow-Up: At [facility], you and your health needs are our priority.  As part of our continuing mission to provide you with exceptional heart care, we have created designated Provider Care Teams.  These Care Teams include your primary Cardiologist (physician) and Advanced Practice Providers (APPs -  Physician Assistants and Nurse Practitioners) who all work together to provide you with the care you need, when you need it.  We recommend signing up for the patient portal called "MyChart".  Sign up information is provided on this After Visit Summary.  MyChart is used to connect with patients for Virtual Visits (Telemedicine).  Patients are able to view lab/test results, encounter notes, upcoming appointments, etc.  Non-urgent messages can be sent to your provider as well.   To learn more about what you can do with  MyChart, go to [email or web address].    Your next appointment:   6 month(s)  The format for your next appointment:   In Person  Provider:   Gypsy Balsam, MD    Other Instructions NA

## 2023-01-15 DIAGNOSIS — I2510 Atherosclerotic heart disease of native coronary artery without angina pectoris: Secondary | ICD-10-CM | POA: Diagnosis not present

## 2023-01-15 DIAGNOSIS — I255 Ischemic cardiomyopathy: Secondary | ICD-10-CM | POA: Diagnosis not present

## 2023-01-15 DIAGNOSIS — R0609 Other forms of dyspnea: Secondary | ICD-10-CM | POA: Diagnosis not present

## 2023-01-15 DIAGNOSIS — I5021 Acute systolic (congestive) heart failure: Secondary | ICD-10-CM | POA: Diagnosis not present

## 2023-01-16 LAB — BASIC METABOLIC PANEL
BUN/Creatinine Ratio: 10 — ABNORMAL LOW (ref 12–28)
BUN: 15 mg/dL (ref 8–27)
CO2: 24 mmol/L (ref 20–29)
Calcium: 9.2 mg/dL (ref 8.7–10.3)
Chloride: 99 mmol/L (ref 96–106)
Creatinine, Ser: 1.43 mg/dL — ABNORMAL HIGH (ref 0.57–1.00)
Glucose: 119 mg/dL — ABNORMAL HIGH (ref 70–99)
Potassium: 4.7 mmol/L (ref 3.5–5.2)
Sodium: 138 mmol/L (ref 134–144)
eGFR: 38 mL/min/{1.73_m2} — ABNORMAL LOW (ref >59)

## 2023-01-16 LAB — PRO B NATRIURETIC PEPTIDE: NT-Pro BNP: 1477 pg/mL — ABNORMAL HIGH (ref 0–738)

## 2023-01-20 ENCOUNTER — Encounter: Payer: Self-pay | Admitting: Hematology and Oncology

## 2023-01-24 ENCOUNTER — Telehealth: Payer: Self-pay

## 2023-01-24 NOTE — Telephone Encounter (Signed)
Unable to reach or LM, results mailed to the patient.  

## 2023-01-24 NOTE — Telephone Encounter (Signed)
-----   Message from Georgeanna Lea, MD sent at 01/22/2023 10:31 AM EDT ----- Blood test are looking fine, continue present management for now

## 2023-01-25 DIAGNOSIS — G4733 Obstructive sleep apnea (adult) (pediatric): Secondary | ICD-10-CM | POA: Diagnosis not present

## 2023-01-27 ENCOUNTER — Telehealth: Payer: Self-pay | Admitting: Cardiology

## 2023-01-27 NOTE — Telephone Encounter (Signed)
Spoke with daughter Selena Batten- Per DPR regarding pts lab results per Dr. Vanetta Shawl note.  She verbalized understanding and had no further questions.

## 2023-01-27 NOTE — Telephone Encounter (Signed)
Patient's daughter called to get patient's results

## 2023-02-03 ENCOUNTER — Other Ambulatory Visit: Payer: Self-pay

## 2023-02-03 DIAGNOSIS — M159 Polyosteoarthritis, unspecified: Secondary | ICD-10-CM

## 2023-02-03 DIAGNOSIS — G8929 Other chronic pain: Secondary | ICD-10-CM

## 2023-02-04 ENCOUNTER — Other Ambulatory Visit: Payer: Self-pay

## 2023-02-04 ENCOUNTER — Encounter: Payer: Self-pay | Admitting: Hematology and Oncology

## 2023-02-04 ENCOUNTER — Other Ambulatory Visit: Payer: Self-pay | Admitting: Oncology

## 2023-02-04 DIAGNOSIS — M479 Spondylosis, unspecified: Secondary | ICD-10-CM

## 2023-02-04 DIAGNOSIS — G8929 Other chronic pain: Secondary | ICD-10-CM

## 2023-02-04 DIAGNOSIS — Z8739 Personal history of other diseases of the musculoskeletal system and connective tissue: Secondary | ICD-10-CM

## 2023-02-04 DIAGNOSIS — M159 Polyosteoarthritis, unspecified: Secondary | ICD-10-CM

## 2023-02-04 MED ORDER — MORPHINE SULFATE ER 15 MG PO TBCR
15.0000 mg | EXTENDED_RELEASE_TABLET | Freq: Two times a day (BID) | ORAL | 0 refills | Status: DC
Start: 2023-02-04 — End: 2023-03-05

## 2023-02-04 MED ORDER — OXYCODONE-ACETAMINOPHEN 10-325 MG PO TABS
1.0000 | ORAL_TABLET | ORAL | 0 refills | Status: DC | PRN
Start: 2023-02-04 — End: 2023-03-05

## 2023-02-04 NOTE — Telephone Encounter (Signed)
Already sent.

## 2023-02-12 ENCOUNTER — Ambulatory Visit: Payer: 59

## 2023-03-05 ENCOUNTER — Other Ambulatory Visit: Payer: Self-pay

## 2023-03-05 DIAGNOSIS — M159 Polyosteoarthritis, unspecified: Secondary | ICD-10-CM

## 2023-03-05 DIAGNOSIS — G8929 Other chronic pain: Secondary | ICD-10-CM

## 2023-03-05 DIAGNOSIS — Z8739 Personal history of other diseases of the musculoskeletal system and connective tissue: Secondary | ICD-10-CM

## 2023-03-05 DIAGNOSIS — M479 Spondylosis, unspecified: Secondary | ICD-10-CM

## 2023-03-05 MED ORDER — MORPHINE SULFATE ER 15 MG PO TBCR
15.0000 mg | EXTENDED_RELEASE_TABLET | Freq: Two times a day (BID) | ORAL | 0 refills | Status: DC
Start: 2023-03-05 — End: 2023-04-02

## 2023-03-05 MED ORDER — OXYCODONE-ACETAMINOPHEN 10-325 MG PO TABS
1.0000 | ORAL_TABLET | ORAL | 0 refills | Status: DC | PRN
Start: 2023-03-05 — End: 2023-04-02

## 2023-03-06 DIAGNOSIS — G4733 Obstructive sleep apnea (adult) (pediatric): Secondary | ICD-10-CM | POA: Diagnosis not present

## 2023-03-12 ENCOUNTER — Ambulatory Visit: Payer: 59 | Attending: Cardiology

## 2023-03-12 DIAGNOSIS — I255 Ischemic cardiomyopathy: Secondary | ICD-10-CM

## 2023-03-14 DIAGNOSIS — Z122 Encounter for screening for malignant neoplasm of respiratory organs: Secondary | ICD-10-CM | POA: Diagnosis not present

## 2023-03-14 DIAGNOSIS — Z87891 Personal history of nicotine dependence: Secondary | ICD-10-CM | POA: Diagnosis not present

## 2023-03-18 ENCOUNTER — Telehealth: Payer: Self-pay

## 2023-03-18 NOTE — Telephone Encounter (Signed)
Spoke with Daughter Selena Batten- per DPR- per Dr. Vanetta Shawl note regarding normal Echo results. Routed to PCP.

## 2023-03-20 DIAGNOSIS — E785 Hyperlipidemia, unspecified: Secondary | ICD-10-CM | POA: Diagnosis not present

## 2023-03-20 DIAGNOSIS — E559 Vitamin D deficiency, unspecified: Secondary | ICD-10-CM | POA: Diagnosis not present

## 2023-03-20 DIAGNOSIS — E538 Deficiency of other specified B group vitamins: Secondary | ICD-10-CM | POA: Diagnosis not present

## 2023-03-20 DIAGNOSIS — K5909 Other constipation: Secondary | ICD-10-CM | POA: Diagnosis not present

## 2023-03-20 DIAGNOSIS — C3490 Malignant neoplasm of unspecified part of unspecified bronchus or lung: Secondary | ICD-10-CM | POA: Diagnosis not present

## 2023-03-20 DIAGNOSIS — J439 Emphysema, unspecified: Secondary | ICD-10-CM | POA: Diagnosis not present

## 2023-03-25 DIAGNOSIS — Z01818 Encounter for other preprocedural examination: Secondary | ICD-10-CM | POA: Diagnosis not present

## 2023-03-25 DIAGNOSIS — H25812 Combined forms of age-related cataract, left eye: Secondary | ICD-10-CM | POA: Diagnosis not present

## 2023-03-25 DIAGNOSIS — H25811 Combined forms of age-related cataract, right eye: Secondary | ICD-10-CM | POA: Diagnosis not present

## 2023-03-28 ENCOUNTER — Ambulatory Visit: Payer: 59 | Admitting: Oncology

## 2023-03-28 ENCOUNTER — Other Ambulatory Visit: Payer: 59

## 2023-04-02 ENCOUNTER — Other Ambulatory Visit: Payer: Self-pay

## 2023-04-02 DIAGNOSIS — Z8739 Personal history of other diseases of the musculoskeletal system and connective tissue: Secondary | ICD-10-CM

## 2023-04-02 DIAGNOSIS — M479 Spondylosis, unspecified: Secondary | ICD-10-CM

## 2023-04-02 DIAGNOSIS — G8929 Other chronic pain: Secondary | ICD-10-CM

## 2023-04-02 DIAGNOSIS — M159 Polyosteoarthritis, unspecified: Secondary | ICD-10-CM

## 2023-04-02 MED ORDER — OXYCODONE-ACETAMINOPHEN 10-325 MG PO TABS
1.0000 | ORAL_TABLET | ORAL | 0 refills | Status: DC | PRN
Start: 2023-04-02 — End: 2023-04-30

## 2023-04-02 MED ORDER — MORPHINE SULFATE ER 15 MG PO TBCR
15.0000 mg | EXTENDED_RELEASE_TABLET | Freq: Two times a day (BID) | ORAL | 0 refills | Status: DC
Start: 2023-04-02 — End: 2023-04-30

## 2023-04-03 DIAGNOSIS — J700 Acute pulmonary manifestations due to radiation: Secondary | ICD-10-CM | POA: Diagnosis not present

## 2023-04-03 DIAGNOSIS — Z87891 Personal history of nicotine dependence: Secondary | ICD-10-CM | POA: Diagnosis not present

## 2023-04-03 DIAGNOSIS — J4550 Severe persistent asthma, uncomplicated: Secondary | ICD-10-CM | POA: Diagnosis not present

## 2023-04-03 DIAGNOSIS — G4733 Obstructive sleep apnea (adult) (pediatric): Secondary | ICD-10-CM | POA: Diagnosis not present

## 2023-04-04 NOTE — Progress Notes (Signed)
[facility]  [address] 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 She was treated with concurrent radiation and chemotherapy, which included 6 cycles of carboplatin and Etoposide, that was completed in October 2014. She has restaging scans every 3 months that have remained stable.  A PET scan in March 2015 showed only mild to moderate metabolic activity in the area of her original tumor.  She does have degenerative disc disease and osteoarthritis with chronic bone pain.  We have agreed to prescribe pain medication for her as she is unable to obtain a primary care provider.  In addition, she has significant osteopenia and was placed on Fosamax along with calcium and vitamin-D.  She continues to complain of lower back pain, and rates it as an 8 and constant..  She states the Percocet 10/325 gives her relief but just for a short period of time.  In June of 2017, she was complaining of a catch under both breasts and some increased cough with occasional chest pain.  At that time her CT scan did show progressive collapse and consolidation of the left upper lobe and so a PET scan was done to evaluate this further.  This revealed mild hypermetabolism within the posterior left upper hemithorax consistent with radiation treatment changes and appeared relatively uniform with no areas of intense uptake suspicious for malignancy.  In December of 2017, she had a rib fracture that was found on x-ray at urgent care. She was also put on MS Contin 15mg  bid. She was seen by Dr. Lynann Bologna and found to have a large gastric ulcer  with bleeding.  She was placed on Protonix twice daily and had a follow-up upper endoscopy in July of 2018, which showed good healing of her ulcer, now consistent with an antral scar, and a small hiatal hernia. .  When I saw her in April, her hemoglobin was up to 11.3 but was back down to 9.1 on July 16th.  Biopsies revealed a reactive gastropathy and was negative for H pylori.  She is on iron supplement. She has had 2 myocardial infarctions, one in March of 2019 and another in June, and was transferred to [facility] in [place].  A chest x-ray done at that time showed just a chronic opacity at the left lung apex consistent with her treated lung cancer.  She also had an echocardiogram which revealed severe impairment of her left ventricular function with an ejection fraction of 30-35%.  She had akinesis of multiple areas of the myocardium.    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  It also revealed a small stable pericardial effusion.  ECHO from July 2021 revealed a good ejection fraction of 60-65% and small stable pericardial effusion.  CT chest from November 2021 revealed stable dense radiation fibrosis involving the left paramediastinal lung and left hilum with no findings suspicious for recurrent tumor. Annual mammogram from December 2021 was negative. CT chest from September 26, 2021 reveals stable dense radiation  fibrosis involving the left upper lobe and left hilum. No findings suspicious for recurrent tumor. No mediastinal or hilar mass or adenopathy. Stable small bilateral adrenal gland nodules, likely benign adenomas. CT chest in February of 2024 remained stable other than intrahepatic and extrahepatic biliary duct dilatation. CT of abdomen and pelvis revealed this to be mild and did not reveal the etiology.  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 Patient states that she feels ok but complains of chronic pain all over her body especially severe in her back. She remains with severe shortness of breath with mild exertion and pain in her lower back especially when walking, even with her walker. She has cataract surgery on her right eye scheduled on 04/14/2023 and then her left eye later on.  She continues oxycodone as needed. Her labs today are pending. I will see her back in 6 months with CBC, CMP, and CT of chest, abdomen, and pelvis. She denies signs of infection such as sore throat, sinus drainage, cough, or urinary symptoms.  She denies fevers or recurrent chills. She denies pain. She denies nausea, vomiting, chest pain, dyspnea or cough. Her appetite is good and her weight has increased 2 pounds over last 3 months .  REVIEW OF SYSTEMS:  Review of Systems  Constitutional: Negative.  Negative for appetite change, chills, diaphoresis, fatigue, fever and unexpected weight change.  HENT:  Negative.  Negative for hearing loss, lump/mass, mouth sores, nosebleeds, sore throat, tinnitus, trouble swallowing and voice change.   Eyes: Negative.  Negative for eye problems and icterus.  Respiratory:  Positive for shortness of breath (severe even with mild exertion). Negative for chest tightness, cough, hemoptysis and wheezing.   Cardiovascular: Negative.  Negative for chest pain, leg swelling and palpitations.  Gastrointestinal: Negative.  Negative for abdominal distention, abdominal pain, blood in stool, constipation, diarrhea, nausea, rectal pain and vomiting.  Endocrine: Negative.   Genitourinary: Negative.  Negative for bladder incontinence, difficulty urinating, dyspareunia, dysuria, frequency, hematuria, menstrual problem, nocturia, pelvic pain, vaginal bleeding and vaginal discharge.   Musculoskeletal:  Positive for back pain and gait problem (uses a walker to  ambulate). Negative for arthralgias, flank pain, myalgias, neck pain and neck stiffness.       Chronic pain  Skin: Negative.  Negative for itching, rash and wound.  Neurological:  Positive for extremity weakness and gait problem (uses a walker to ambulate). Negative for dizziness, headaches, light-headedness, numbness, seizures and speech difficulty.  Hematological: Negative.  Negative for adenopathy. Does not bruise/bleed easily.  Psychiatric/Behavioral: Negative.  Negative for confusion, decreased concentration, depression, sleep disturbance and suicidal ideas. The patient is not nervous/anxious.     VITALS:  Blood pressure (!) 175/72, pulse 75, temperature 97.8 F (36.6 C), temperature source Oral, resp. rate 20, height 5\' 6"  (1.676 m), weight 181 lb 8 oz (82.3 kg), SpO2 96%.  Wt Readings from Last 3 Encounters:  04/09/23 181 lb 8 oz (82.3 kg)  01/14/23 179 lb 6.4 oz (81.4 kg)  09/27/22 178 lb 8 oz (81 kg)    Body mass index is 29.29 kg/m.  Performance status (ECOG): 1 - Symptomatic but completely ambulatory  PHYSICAL EXAM:  Physical Exam Vitals and nursing note reviewed.  Constitutional:      General: She is not in acute distress.    Appearance: Normal appearance. She is normal weight. She is not ill-appearing, toxic-appearing or diaphoretic.  HENT:  Head: Normocephalic and atraumatic.     Right Ear: Tympanic membrane, ear canal and external ear normal. There is no impacted cerumen.     Left Ear: Tympanic membrane, ear canal and external ear normal. There is no impacted cerumen.     Nose: Nose normal. No congestion or rhinorrhea.     Mouth/Throat:     Mouth: Mucous membranes are moist.     Pharynx: Oropharynx is clear. No oropharyngeal exudate or posterior oropharyngeal erythema.  Eyes:     General: No scleral icterus.       Right eye: No discharge.        Left eye: No discharge.     Extraocular Movements: Extraocular movements intact.     Conjunctiva/sclera:  Conjunctivae normal.     Pupils: Pupils are equal, round, and reactive to light.  Neck:     Vascular: No carotid bruit.  Cardiovascular:     Rate and Rhythm: Normal rate and regular rhythm.     Pulses: Normal pulses.     Heart sounds: Murmur heard.     Systolic murmur is present with a grade of 1/6.     No friction rub. No gallop.  Pulmonary:     Effort: Pulmonary effort is normal. No respiratory distress.     Breath sounds: Normal breath sounds. No stridor. No wheezing, rhonchi or rales.  Chest:     Chest wall: No tenderness.  Abdominal:     General: Bowel sounds are normal. There is no distension.     Palpations: Abdomen is soft. There is no hepatomegaly, splenomegaly or mass.     Tenderness: There is no abdominal tenderness. There is no right CVA tenderness, left CVA tenderness, guarding or rebound.     Hernia: No hernia is present.  Musculoskeletal:        General: No swelling, tenderness, deformity or signs of injury. Normal range of motion.     Cervical back: Normal range of motion and neck supple. No rigidity or tenderness.     Right lower leg: No edema.     Left lower leg: No edema.  Lymphadenopathy:     Cervical: No cervical adenopathy.  Skin:    General: Skin is warm and dry.     Coloration: Skin is not jaundiced or pale.     Findings: No bruising, erythema, lesion or rash.  Neurological:     General: No focal deficit present.     Mental Status: She is alert and oriented to person, place, and time. Mental status is at baseline.     Cranial Nerves: No cranial nerve deficit.     Sensory: No sensory deficit.     Motor: No weakness.     Coordination: Coordination normal.     Gait: Gait normal.     Deep Tendon Reflexes: Reflexes normal.  Psychiatric:        Mood and Affect: Mood normal.        Behavior: Behavior normal.        Thought Content: Thought content normal.        Judgment: Judgment normal.    LABS:      Latest Ref Rng & Units 04/09/2023    4:01 PM  09/25/2022   12:00 AM 03/27/2022   12:00 AM  CBC  WBC 4.0 - 10.5 K/uL 9.3  8.0     8.0      Hemoglobin 12.0 - 15.0 g/dL 47.8  29.5     11.3  Hematocrit 36.0 - 46.0 % 36.2  34     34      Platelets 150 - 400 K/uL 193  347     288         This result is from an external source.      Latest Ref Rng & Units 04/09/2023    4:01 PM 01/15/2023    4:02 PM 10/04/2022    3:46 PM  CMP  Glucose 70 - 99 mg/dL 161  096    BUN 8 - 23 mg/dL 18  15    Creatinine 0.45 - 1.00 mg/dL 4.09  8.11    Sodium 914 - 145 mmol/L 138  138    Potassium 3.5 - 5.1 mmol/L 4.7  4.7    Chloride 98 - 111 mmol/L 103  99    CO2 22 - 32 mmol/L 25  24    Calcium 8.9 - 10.3 mg/dL 9.6  9.2    Total Protein 6.5 - 8.1 g/dL 7.6     Total Bilirubin 0.3 - 1.2 mg/dL 0.3     Alkaline Phos 38 - 126 U/L 57     AST 15 - 41 U/L 20   17   ALT 0 - 44 U/L 15   10    Component Ref Range & Units 01/15/2023  NT-Pro BNP 0 - 738 pg/mL 1,477 High    Lab Results  Component Value Date   CEA1 2.1 03/27/2022   CEA 1.3 09/25/2022   /  CEA  Date Value Ref Range Status  09/25/2022 1.3  Final  03/27/2022 2.1 0.0 - 4.7 ng/mL Final    Comment:    (NOTE)                             Nonsmokers          <3.9                             Smokers             <5.6 [employer] Electrochemiluminescence Immunoassay (ECLIA) Values obtained with different assay methods or kits cannot be used interchangeably.  Results cannot be interpreted as absolute evidence of the presence or absence of malignant disease. Performed At: [facility] [address] [place], [place] [id number] Jolene Schimke MD YQ:[id number]    STUDIES:     Exam:  09/25/2022 Ct Chest with Contrast Impression: Post treatment changes in the left hemithorax without evidence of recurrent or metastatic disease. Moderate pericardial effusion, stable. Intrahepatic and extrahepatic bilary ductal dilation appears increased from 04/17/2022. Please correlate  clinically and consider right upper quadrant ultrasound or CT abdomen with contrast in further evaluation, as clinically indicated.  Aortic Atherosclerosis (ICD10-170.0) Coronary Artery calcification.  Emphysema (ICD10-J43.9)  EXAM: 09/26/2021 CT CHEST WITH CONTRAST  IMPRESSION:  1. Stable dense radiation fibrosis involving the left upper lobe and  left hilum. No findings suspicious for recurrent tumor.  2. No mediastinal or hilar mass or adenopathy.  3. Stable 3 mm subpleural pulmonary nodule in the right lower lobe.  4. Stable advanced vascular calcifications.  5. Stable small bilateral adrenal gland nodules, likely benign  adenomas.    Allergies: No Known Allergies  Current Medications: Current Outpatient Medications  Medication Sig Dispense Refill   prednisoLONE acetate (PRED FORTE) 1 % ophthalmic suspension SMARTSIG:In Eye(s)     albuterol (PROVENTIL) (2.5 MG/3ML) 0.083%  nebulizer solution Take 2.5 mg by nebulization every 6 (six) hours as needed for wheezing or shortness of breath.     albuterol (VENTOLIN HFA) 108 (90 Base) MCG/ACT inhaler Inhale 2 puffs into the lungs as needed for shortness of breath.     Cholecalciferol (VITAMIN D3) 1.25 MG (50000 UT) TABS Take 1 tablet by mouth once a week. (Patient not taking: Reported on 01/14/2023)     clopidogrel (PLAVIX) 75 MG tablet Take 1 tablet (75 mg total) by mouth daily. 30 tablet 6   Cyanocobalamin (VITAMIN B-12 PO) Take 1 tablet by mouth daily.     doxepin (SINEQUAN) 10 MG capsule Take 10 mg by mouth daily.     doxylamine, Sleep, (UNISOM) 25 MG tablet Take 25 mg by mouth at bedtime as needed for sleep.     ezetimibe (ZETIA) 10 MG tablet Take 1 tablet (10 mg total) by mouth daily. 30 tablet 6   Ferrous Sulfate (FEROSUL PO) Take 325 mg by mouth daily.     folic acid (FOLVITE) 1 MG tablet Take 1 tablet (1 mg total) by mouth daily. 30 tablet 5   furosemide (LASIX) 20 MG tablet Take 1 tablet (20 mg total) by mouth daily. 90 tablet 3    ipratropium-albuterol (DUONEB) 0.5-2.5 (3) MG/3ML SOLN Take 3 mLs by nebulization every 6 (six) hours as needed (SOB or wheezing).     lubiprostone (AMITIZA) 24 MCG capsule Take 24 mcg by mouth 2 (two) times daily with a meal.     metoprolol succinate (TOPROL-XL) 25 MG 24 hr tablet Take 25 mg by mouth daily.     morphine (MS CONTIN) 15 MG 12 hr tablet Take 1 tablet (15 mg total) by mouth every 12 (twelve) hours. 60 tablet 0   naloxegol oxalate (MOVANTIK) 25 MG TABS tablet Take 25 mg by mouth daily.     nitroGLYCERIN (NITROSTAT) 0.4 MG SL tablet Place 1 tablet (0.4 mg total) under the tongue every 5 (five) minutes x 3 doses as needed for chest pain. 25 tablet 12   ondansetron (ZOFRAN ODT) 4 MG disintegrating tablet 4mg  ODT q4 hours prn nausea/vomit (Patient taking differently: Take 4 mg by mouth every 4 (four) hours as needed for nausea or vomiting. 4mg  ODT q4 hours prn nausea/vomit) 8 tablet 0   oxyCODONE-acetaminophen (PERCOCET) 10-325 MG tablet Take 1 tablet by mouth every 4 (four) hours as needed for pain. 120 tablet 0   pantoprazole (PROTONIX) 40 MG tablet Take 40 mg by mouth 2 (two) times daily.     polyethylene glycol (MIRALAX / GLYCOLAX) packet Take 17 g by mouth daily.     potassium chloride (K-DUR) 10 MEQ tablet Take 10 mEq by mouth daily.     prochlorperazine (COMPAZINE) 10 MG tablet Take 10 mg by mouth 3 (three) times daily as needed for nausea/vomiting, nausea or vomiting.     rosuvastatin (CRESTOR) 20 MG tablet Take 1 tablet (20 mg total) by mouth daily. 90 tablet 3   tiZANidine (ZANAFLEX) 4 MG tablet Take 4 mg by mouth at bedtime as needed for muscle spasms.     traZODone (DESYREL) 50 MG tablet Take 50 mg by mouth at bedtime.     TRELEGY ELLIPTA 200-62.5-25 MCG/ACT AEPB Inhale 1 puff into the lungs daily.     Vitamin D, Ergocalciferol, (DRISDOL) 50000 units CAPS capsule Take 50,000 Units by mouth every 7 (seven) days.     No current facility-administered medications for this visit.      ASSESSMENT & PLAN:  Assessment:   1.  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  This was treated with chemotherapy and radiation.  She remains without evidence of disease for over 10 years.    2.  Tiny stable pulmonary nodules, stable.  3.  Chronic back pain from severe degenerative disc disease and osteoarthritis.  She continues oxycodone 10/325 with fairly good control of her pain.  4.  Stable, moderate pericardial effusion.  ECHO from July 2021 revealed an EF between 60-65%.  Her cardiologist Dr. Bing Matter ordered a diuretic.  5.  COPD with severe dyspnea. She finally quit smoking in November 2022.  Dr. Blenda Nicely has diagnosed sleep apnea and she now uses a CPAP mask.  6.  Anemia.  This is mild and normochromic, normocytic. It has improved somewhat with folate supplement.  Plan: She remains with severe shortness of breath with mild exertion and severe pain in her lower back especially when walking, even with her walker. She has cataract surgery on her right eye scheduled on 04/14/2023 and then her left eye later on. She continues oxycodone as needed. Her labs today ae pending. I will see her back in 6 months with CBC, CMP, port flush, and CT of chest, abdomen, and pelvis. She understands and agrees with this plan of care.   I provided 10 minutes of face-to-face time during this this encounter and > 50% was spent counseling as documented under my assessment and plan.    Dellia Beckwith, MD [facility] [address] [place] [address] Dept: [phone] Dept Fax: [phone]    Rulon Sera Lassiter,acting as a scribe for Dellia Beckwith, MD.,have documented all relevant documentation on the behalf of Dellia Beckwith, MD,as directed by  Dellia Beckwith, MD while in the presence of Dellia Beckwith, MD.

## 2023-04-09 ENCOUNTER — Other Ambulatory Visit: Payer: Self-pay | Admitting: Oncology

## 2023-04-09 ENCOUNTER — Inpatient Hospital Stay: Payer: 59 | Attending: Oncology | Admitting: Oncology

## 2023-04-09 ENCOUNTER — Inpatient Hospital Stay (INDEPENDENT_AMBULATORY_CARE_PROVIDER_SITE_OTHER): Payer: 59 | Admitting: Oncology

## 2023-04-09 ENCOUNTER — Encounter: Payer: Self-pay | Admitting: Oncology

## 2023-04-09 ENCOUNTER — Other Ambulatory Visit: Payer: Self-pay | Admitting: Hematology and Oncology

## 2023-04-09 VITALS — BP 175/72 | HR 75 | Temp 97.8°F | Resp 20 | Ht 66.0 in | Wt 181.5 lb

## 2023-04-09 DIAGNOSIS — D649 Anemia, unspecified: Secondary | ICD-10-CM | POA: Insufficient documentation

## 2023-04-09 DIAGNOSIS — G8929 Other chronic pain: Secondary | ICD-10-CM | POA: Diagnosis not present

## 2023-04-09 DIAGNOSIS — C3490 Malignant neoplasm of unspecified part of unspecified bronchus or lung: Secondary | ICD-10-CM

## 2023-04-09 DIAGNOSIS — Z923 Personal history of irradiation: Secondary | ICD-10-CM | POA: Diagnosis not present

## 2023-04-09 DIAGNOSIS — C3412 Malignant neoplasm of upper lobe, left bronchus or lung: Secondary | ICD-10-CM

## 2023-04-09 DIAGNOSIS — Z9221 Personal history of antineoplastic chemotherapy: Secondary | ICD-10-CM | POA: Diagnosis not present

## 2023-04-09 DIAGNOSIS — Z85118 Personal history of other malignant neoplasm of bronchus and lung: Secondary | ICD-10-CM | POA: Diagnosis not present

## 2023-04-09 DIAGNOSIS — M1990 Unspecified osteoarthritis, unspecified site: Secondary | ICD-10-CM | POA: Diagnosis not present

## 2023-04-09 LAB — CBC WITH DIFFERENTIAL (CANCER CENTER ONLY)
Abs Immature Granulocytes: 0.03 10*3/uL (ref 0.00–0.07)
Basophils Absolute: 0.1 10*3/uL (ref 0.0–0.1)
Basophils Relative: 1 %
Eosinophils Absolute: 0.2 10*3/uL (ref 0.0–0.5)
Eosinophils Relative: 2 %
HCT: 36.2 % (ref 36.0–46.0)
Hemoglobin: 11.5 g/dL — ABNORMAL LOW (ref 12.0–15.0)
Immature Granulocytes: 0 %
Lymphocytes Relative: 16 %
Lymphs Abs: 1.5 10*3/uL (ref 0.7–4.0)
MCH: 30 pg (ref 26.0–34.0)
MCHC: 31.8 g/dL (ref 30.0–36.0)
MCV: 94.5 fL (ref 80.0–100.0)
Monocytes Absolute: 0.8 10*3/uL (ref 0.1–1.0)
Monocytes Relative: 9 %
Neutro Abs: 6.6 10*3/uL (ref 1.7–7.7)
Neutrophils Relative %: 72 %
Platelet Count: 193 10*3/uL (ref 150–400)
RBC: 3.83 MIL/uL — ABNORMAL LOW (ref 3.87–5.11)
RDW: 13.7 % (ref 11.5–15.5)
WBC Count: 9.3 10*3/uL (ref 4.0–10.5)
nRBC: 0 % (ref 0.0–0.2)

## 2023-04-09 LAB — CMP (CANCER CENTER ONLY)
ALT: 15 U/L (ref 0–44)
AST: 20 U/L (ref 15–41)
Albumin: 3.8 g/dL (ref 3.5–5.0)
Alkaline Phosphatase: 57 U/L (ref 38–126)
Anion gap: 10 (ref 5–15)
BUN: 18 mg/dL (ref 8–23)
CO2: 25 mmol/L (ref 22–32)
Calcium: 9.6 mg/dL (ref 8.9–10.3)
Chloride: 103 mmol/L (ref 98–111)
Creatinine: 1.69 mg/dL — ABNORMAL HIGH (ref 0.44–1.00)
GFR, Estimated: 31 mL/min — ABNORMAL LOW (ref >60)
Glucose, Bld: 123 mg/dL — ABNORMAL HIGH (ref 70–99)
Potassium: 4.7 mmol/L (ref 3.5–5.1)
Sodium: 138 mmol/L (ref 135–145)
Total Bilirubin: 0.3 mg/dL (ref 0.3–1.2)
Total Protein: 7.6 g/dL (ref 6.5–8.1)

## 2023-04-11 DIAGNOSIS — G4733 Obstructive sleep apnea (adult) (pediatric): Secondary | ICD-10-CM | POA: Diagnosis not present

## 2023-04-14 DIAGNOSIS — H268 Other specified cataract: Secondary | ICD-10-CM | POA: Diagnosis not present

## 2023-04-14 DIAGNOSIS — H25811 Combined forms of age-related cataract, right eye: Secondary | ICD-10-CM | POA: Diagnosis not present

## 2023-04-15 DIAGNOSIS — G4733 Obstructive sleep apnea (adult) (pediatric): Secondary | ICD-10-CM | POA: Diagnosis not present

## 2023-04-22 ENCOUNTER — Encounter: Payer: Self-pay | Admitting: Oncology

## 2023-04-27 ENCOUNTER — Encounter: Payer: Self-pay | Admitting: Hematology and Oncology

## 2023-04-29 ENCOUNTER — Telehealth: Payer: Self-pay

## 2023-04-29 NOTE — Telephone Encounter (Signed)
-----   Message from Dellia Beckwith sent at 04/27/2023  8:43 PM EDT ----- Regarding: call Tell her labs okay but kidneiy function a little worse each time.  Make sure she is drinking enough fluids.  Dr. Kirtland Bouchard put her on diuretic - send copy of her labs to PCP and Dr Bing Matter. The anemia ia mild and stable, rest normal

## 2023-04-29 NOTE — Telephone Encounter (Signed)
Attempted to contact patient but no answer at this time.

## 2023-04-30 ENCOUNTER — Other Ambulatory Visit: Payer: Self-pay

## 2023-04-30 ENCOUNTER — Telehealth: Payer: Self-pay

## 2023-04-30 DIAGNOSIS — M159 Polyosteoarthritis, unspecified: Secondary | ICD-10-CM

## 2023-04-30 DIAGNOSIS — G8929 Other chronic pain: Secondary | ICD-10-CM

## 2023-04-30 DIAGNOSIS — Z8739 Personal history of other diseases of the musculoskeletal system and connective tissue: Secondary | ICD-10-CM

## 2023-04-30 DIAGNOSIS — M479 Spondylosis, unspecified: Secondary | ICD-10-CM

## 2023-04-30 NOTE — Telephone Encounter (Signed)
Patient called to get refill on medication. Notified patient of message.

## 2023-04-30 NOTE — Telephone Encounter (Signed)
-----   Message from Dellia Beckwith sent at 04/27/2023  8:43 PM EDT ----- Regarding: call Tell her labs okay but kidneiy function a little worse each time.  Make sure she is drinking enough fluids.  Dr. Kirtland Bouchard put her on diuretic - send copy of her labs to PCP and Dr Bing Matter. The anemia ia mild and stable, rest normal

## 2023-05-01 ENCOUNTER — Telehealth: Payer: Self-pay

## 2023-05-01 ENCOUNTER — Encounter: Payer: Self-pay | Admitting: Hematology and Oncology

## 2023-05-01 MED ORDER — MORPHINE SULFATE ER 15 MG PO TBCR
15.0000 mg | EXTENDED_RELEASE_TABLET | Freq: Two times a day (BID) | ORAL | 0 refills | Status: DC
Start: 2023-05-01 — End: 2023-05-29

## 2023-05-01 MED ORDER — OXYCODONE-ACETAMINOPHEN 10-325 MG PO TABS
1.0000 | ORAL_TABLET | ORAL | 0 refills | Status: DC | PRN
Start: 2023-05-01 — End: 2023-05-02

## 2023-05-01 NOTE — Telephone Encounter (Signed)
-----   Message from Adah Perl sent at 05/01/2023  9:50 AM EDT ----- Regarding: RE: pain medication Rx sent ----- Message ----- From: Dyane Dustman, RN Sent: 05/01/2023   9:11 AM EDT To: Adah Perl, PA-C Subject: pain medication                                Called requesting refill on her Morphine ER 15mg  and her oxycodone/ace 10/325mg .  Last filled on 05/03/2023.  Please send to [facility] on [address].

## 2023-05-01 NOTE — Telephone Encounter (Signed)
Patient aware.

## 2023-05-01 NOTE — Telephone Encounter (Signed)
Patient called stating that [facility] in [place] does not have her Oxy in stock and they are on backorder. Can we please send it to [facility] in [place]?

## 2023-05-02 ENCOUNTER — Other Ambulatory Visit: Payer: Self-pay | Admitting: Hematology and Oncology

## 2023-05-02 ENCOUNTER — Encounter: Payer: Self-pay | Admitting: Hematology and Oncology

## 2023-05-02 DIAGNOSIS — M159 Polyosteoarthritis, unspecified: Secondary | ICD-10-CM

## 2023-05-02 DIAGNOSIS — G8929 Other chronic pain: Secondary | ICD-10-CM

## 2023-05-02 MED ORDER — OXYCODONE-ACETAMINOPHEN 10-325 MG PO TABS: 1.0000 | ORAL_TABLET | ORAL | 0 refills | Status: DC | PRN

## 2023-05-29 ENCOUNTER — Other Ambulatory Visit: Payer: Self-pay

## 2023-05-29 ENCOUNTER — Other Ambulatory Visit: Payer: Self-pay | Admitting: Oncology

## 2023-05-29 DIAGNOSIS — M159 Polyosteoarthritis, unspecified: Secondary | ICD-10-CM

## 2023-05-29 DIAGNOSIS — Z8739 Personal history of other diseases of the musculoskeletal system and connective tissue: Secondary | ICD-10-CM

## 2023-05-29 DIAGNOSIS — M479 Spondylosis, unspecified: Secondary | ICD-10-CM

## 2023-05-29 DIAGNOSIS — G8929 Other chronic pain: Secondary | ICD-10-CM

## 2023-05-29 MED ORDER — MORPHINE SULFATE ER 15 MG PO TBCR
15.0000 mg | EXTENDED_RELEASE_TABLET | Freq: Two times a day (BID) | ORAL | 0 refills | Status: DC
Start: 2023-05-29 — End: 2023-06-27

## 2023-05-29 MED ORDER — OXYCODONE-ACETAMINOPHEN 10-325 MG PO TABS: 1.0000 | ORAL_TABLET | ORAL | 0 refills | Status: DC | PRN

## 2023-05-29 MED ORDER — MORPHINE SULFATE ER 15 MG PO TBCR: 15.0000 mg | EXTENDED_RELEASE_TABLET | Freq: Two times a day (BID) | ORAL | 0 refills | Status: DC

## 2023-06-02 DIAGNOSIS — H25812 Combined forms of age-related cataract, left eye: Secondary | ICD-10-CM | POA: Diagnosis not present

## 2023-06-06 DIAGNOSIS — G4733 Obstructive sleep apnea (adult) (pediatric): Secondary | ICD-10-CM | POA: Diagnosis not present

## 2023-06-10 ENCOUNTER — Other Ambulatory Visit: Payer: Self-pay | Admitting: *Deleted

## 2023-06-10 DIAGNOSIS — I701 Atherosclerosis of renal artery: Secondary | ICD-10-CM

## 2023-06-10 DIAGNOSIS — K551 Chronic vascular disorders of intestine: Secondary | ICD-10-CM

## 2023-06-18 ENCOUNTER — Ambulatory Visit (INDEPENDENT_AMBULATORY_CARE_PROVIDER_SITE_OTHER): Payer: 59 | Admitting: Physician Assistant

## 2023-06-18 ENCOUNTER — Ambulatory Visit (HOSPITAL_COMMUNITY)
Admission: RE | Admit: 2023-06-18 | Discharge: 2023-06-18 | Disposition: A | Discharge status: Home / Self Care | Payer: 59 | Source: Ambulatory Visit | Attending: Vascular Surgery | Admitting: Vascular Surgery

## 2023-06-18 VITALS — BP 146/61 | HR 77 | Temp 97.7°F | Ht 65.0 in | Wt 186.8 lb

## 2023-06-18 DIAGNOSIS — I701 Atherosclerosis of renal artery: Secondary | ICD-10-CM | POA: Insufficient documentation

## 2023-06-18 DIAGNOSIS — K551 Chronic vascular disorders of intestine: Secondary | ICD-10-CM | POA: Insufficient documentation

## 2023-06-18 NOTE — Progress Notes (Signed)
Office Note   History of Present Illness   Ashley Miles is a 77 y.o. ([birth year]) female who presents for follow up renal artery and SMA stenosis.  She was first seen by Dr. Randie Heinz in 2023 with asymptomatic celiac, SMA, and renal artery stenosis.  She does have a history of CKD but this is stable.  She returns today for follow-up.  She denies any recent changes to her health.  She states her renal function is stable.  Her blood pressure is well-controlled.  She denies any unintentional weight loss, postprandial pain, or food fear.  Current Outpatient Medications  Medication Sig Dispense Refill   albuterol (PROVENTIL) (2.5 MG/3ML) 0.083% nebulizer solution Take 2.5 mg by nebulization every 6 (six) hours as needed for wheezing or shortness of breath.     albuterol (VENTOLIN HFA) 108 (90 Base) MCG/ACT inhaler Inhale 2 puffs into the lungs as needed for shortness of breath.     Cholecalciferol (VITAMIN D3) 1.25 MG (50000 UT) TABS Take 1 tablet by mouth once a week. (Patient not taking: Reported on 01/14/2023)     clopidogrel (PLAVIX) 75 MG tablet Take 1 tablet (75 mg total) by mouth daily. 30 tablet 6   Cyanocobalamin (VITAMIN B-12 PO) Take 1 tablet by mouth daily.     doxepin (SINEQUAN) 10 MG capsule Take 10 mg by mouth daily.     doxylamine, Sleep, (UNISOM) 25 MG tablet Take 25 mg by mouth at bedtime as needed for sleep.     ezetimibe (ZETIA) 10 MG tablet Take 1 tablet (10 mg total) by mouth daily. 30 tablet 6   Ferrous Sulfate (FEROSUL PO) Take 325 mg by mouth daily.     folic acid (FOLVITE) 1 MG tablet Take 1 tablet (1 mg total) by mouth daily. 30 tablet 5   furosemide (LASIX) 20 MG tablet Take 1 tablet (20 mg total) by mouth daily. 90 tablet 3   ipratropium-albuterol (DUONEB) 0.5-2.5 (3) MG/3ML SOLN Take 3 mLs by nebulization every 6 (six) hours as needed (SOB or wheezing).     lubiprostone (AMITIZA) 24 MCG capsule Take 24 mcg by mouth 2 (two) times daily with a meal.     metoprolol  succinate (TOPROL-XL) 25 MG 24 hr tablet Take 25 mg by mouth daily.     morphine (MS CONTIN) 15 MG 12 hr tablet Take 1 tablet (15 mg total) by mouth every 12 (twelve) hours. 60 tablet 0   naloxegol oxalate (MOVANTIK) 25 MG TABS tablet Take 25 mg by mouth daily.     nitroGLYCERIN (NITROSTAT) 0.4 MG SL tablet Place 1 tablet (0.4 mg total) under the tongue every 5 (five) minutes x 3 doses as needed for chest pain. 25 tablet 12   ondansetron (ZOFRAN ODT) 4 MG disintegrating tablet 4mg  ODT q4 hours prn nausea/vomit (Patient taking differently: Take 4 mg by mouth every 4 (four) hours as needed for nausea or vomiting. 4mg  ODT q4 hours prn nausea/vomit) 8 tablet 0   oxyCODONE-acetaminophen (PERCOCET) 10-325 MG tablet Take 1 tablet by mouth every 4 (four) hours as needed for pain. 120 tablet 0   pantoprazole (PROTONIX) 40 MG tablet Take 40 mg by mouth 2 (two) times daily.     polyethylene glycol (MIRALAX / GLYCOLAX) packet Take 17 g by mouth daily.     potassium chloride (K-DUR) 10 MEQ tablet Take 10 mEq by mouth daily.     prednisoLONE acetate (PRED FORTE) 1 % ophthalmic suspension SMARTSIG:In Eye(s)     prochlorperazine (  COMPAZINE) 10 MG tablet Take 10 mg by mouth 3 (three) times daily as needed for nausea/vomiting, nausea or vomiting.     rosuvastatin (CRESTOR) 20 MG tablet Take 1 tablet (20 mg total) by mouth daily. 90 tablet 3   tiZANidine (ZANAFLEX) 4 MG tablet Take 4 mg by mouth at bedtime as needed for muscle spasms.     traZODone (DESYREL) 50 MG tablet Take 50 mg by mouth at bedtime.     TRELEGY ELLIPTA 200-62.5-25 MCG/ACT AEPB Inhale 1 puff into the lungs daily.     Vitamin D, Ergocalciferol, (DRISDOL) 50000 units CAPS capsule Take 50,000 Units by mouth every 7 (seven) days.     No current facility-administered medications for this visit.    REVIEW OF SYSTEMS (negative unless checked):   Cardiac:  []  Chest pain or chest pressure? []  Shortness of breath upon activity? []  Shortness of breath  when lying flat? []  Irregular heart rhythm?  Vascular:  []  Pain in calf, thigh, or hip brought on by walking? []  Pain in feet at night that wakes you up from your sleep? []  Blood clot in your veins? []  Leg swelling?  Pulmonary:  []  Oxygen at home? []  Productive cough? []  Wheezing?  Neurologic:  []  Sudden weakness in arms or legs? []  Sudden numbness in arms or legs? []  Sudden onset of difficult speaking or slurred speech? []  Temporary loss of vision in one eye? []  Problems with dizziness?  Gastrointestinal:  []  Blood in stool? []  Vomited blood?  Genitourinary:  []  Burning when urinating? []  Blood in urine?  Psychiatric:  []  Major depression  Hematologic:  []  Bleeding problems? []  Problems with blood clotting?  Dermatologic:  []  Rashes or ulcers?  Constitutional:  []  Fever or chills?  Ear/Nose/Throat:  []  Change in hearing? []  Nose bleeds? []  Sore throat?  Musculoskeletal:  []  Back pain? []  Joint pain? []  Muscle pain?   Physical Examination   Vitals:   06/18/23 0940  BP: (!) 146/61  Pulse: 77  Temp: 97.7 F (36.5 C)  SpO2: 93%  Weight: 186 lb 12.8 oz (84.7 kg)  Height: 5\' 5"  (1.651 m)   Body mass index is 31.09 kg/m.  General:  WDWN in NAD; vital signs documented above Gait: Not observed HENT: WNL, normocephalic Pulmonary: normal non-labored breathing , without rales, rhonchi,  wheezing Cardiac: regular Abdomen: soft, NT, no masses Skin: without rashes Vascular Exam/Pulses: Palpable radial pulses.  Bilateral lower extremities warm well-perfused Extremities: without ischemic changes, without gangrene , without cellulitis; without open wounds;  Musculoskeletal: no muscle wasting or atrophy  Neurologic: A&O X 3;  No focal weakness or paresthesias are detected Psychiatric:  The pt has Normal affect.   Non-Invasive Vascular Imaging   Mesenteric Duplex (06/18/2023) Unchanged celiac artery and SMA stenosis.  Peak celiac artery velocities of  157 cm/s.  Peak velocities and proximal SMA at 331 cm/s.  Unchanged renal artery stenosis.   Medical Decision Making   Ashley Miles is a 77 y.o. (February 09, 1946) female who presents for surveillance of celiac artery, SMA, and renal artery stenosis  Based on this patient's duplex, the stenosis in her celiac artery, renal arteries, and superior mesenteric arteries is unchanged.  She has less t+han 70% stenosis of the celiac artery.  She has a greater than 70% stenosis of the SMA.  She has less than 60% stenosis of the renal arteries. She denies any recent changes to her kidney function.  She states her hypertension is well-controlled.  She has no history  of chronic mesenteric ischemia symptoms.  She denies any food fear, postprandial pain, or unintentional weight loss On exam she has warm and well-perfused bilateral upper and lower extremities.  She has no tenderness to palpation of her abdomen. She can follow-up with our office in 1 year with repeat mesenteric duplex   Loel Dubonnet PA-C [facility] Office: [phone]  Clinic MD: Randie Heinz

## 2023-06-24 DIAGNOSIS — C3490 Malignant neoplasm of unspecified part of unspecified bronchus or lung: Secondary | ICD-10-CM | POA: Diagnosis not present

## 2023-06-24 DIAGNOSIS — E785 Hyperlipidemia, unspecified: Secondary | ICD-10-CM | POA: Diagnosis not present

## 2023-06-24 DIAGNOSIS — G4700 Insomnia, unspecified: Secondary | ICD-10-CM | POA: Diagnosis not present

## 2023-06-24 DIAGNOSIS — K5909 Other constipation: Secondary | ICD-10-CM | POA: Diagnosis not present

## 2023-06-24 DIAGNOSIS — E538 Deficiency of other specified B group vitamins: Secondary | ICD-10-CM | POA: Diagnosis not present

## 2023-06-24 DIAGNOSIS — Z8673 Personal history of transient ischemic attack (TIA), and cerebral infarction without residual deficits: Secondary | ICD-10-CM | POA: Diagnosis not present

## 2023-06-24 DIAGNOSIS — D509 Iron deficiency anemia, unspecified: Secondary | ICD-10-CM | POA: Diagnosis not present

## 2023-06-24 DIAGNOSIS — E559 Vitamin D deficiency, unspecified: Secondary | ICD-10-CM | POA: Diagnosis not present

## 2023-06-27 ENCOUNTER — Other Ambulatory Visit: Payer: Self-pay

## 2023-06-27 DIAGNOSIS — M159 Polyosteoarthritis, unspecified: Secondary | ICD-10-CM

## 2023-06-27 DIAGNOSIS — M479 Spondylosis, unspecified: Secondary | ICD-10-CM

## 2023-06-27 DIAGNOSIS — G8929 Other chronic pain: Secondary | ICD-10-CM

## 2023-06-27 DIAGNOSIS — Z8739 Personal history of other diseases of the musculoskeletal system and connective tissue: Secondary | ICD-10-CM

## 2023-06-27 DIAGNOSIS — K551 Chronic vascular disorders of intestine: Secondary | ICD-10-CM

## 2023-06-27 MED ORDER — OXYCODONE-ACETAMINOPHEN 10-325 MG PO TABS: 1.0000 | ORAL_TABLET | ORAL | 0 refills | Status: DC | PRN

## 2023-06-27 MED ORDER — MORPHINE SULFATE ER 15 MG PO TBCR: 15.0000 mg | EXTENDED_RELEASE_TABLET | Freq: Two times a day (BID) | ORAL | 0 refills | Status: DC

## 2023-07-15 DIAGNOSIS — G4733 Obstructive sleep apnea (adult) (pediatric): Secondary | ICD-10-CM | POA: Diagnosis not present

## 2023-07-21 DIAGNOSIS — Z9181 History of falling: Secondary | ICD-10-CM | POA: Diagnosis not present

## 2023-07-21 DIAGNOSIS — Z0000 Encounter for general adult medical examination without abnormal findings: Secondary | ICD-10-CM | POA: Diagnosis not present

## 2023-07-21 DIAGNOSIS — Z139 Encounter for screening, unspecified: Secondary | ICD-10-CM | POA: Diagnosis not present

## 2023-07-24 ENCOUNTER — Other Ambulatory Visit: Payer: Self-pay

## 2023-07-24 DIAGNOSIS — G8929 Other chronic pain: Secondary | ICD-10-CM

## 2023-07-24 DIAGNOSIS — M159 Polyosteoarthritis, unspecified: Secondary | ICD-10-CM

## 2023-07-24 DIAGNOSIS — M479 Spondylosis, unspecified: Secondary | ICD-10-CM

## 2023-07-24 DIAGNOSIS — Z8739 Personal history of other diseases of the musculoskeletal system and connective tissue: Secondary | ICD-10-CM

## 2023-07-24 MED ORDER — OXYCODONE-ACETAMINOPHEN 10-325 MG PO TABS: 1.0000 | ORAL_TABLET | ORAL | 0 refills | Status: DC | PRN

## 2023-07-24 MED ORDER — MORPHINE SULFATE ER 15 MG PO TBCR: 15.0000 mg | EXTENDED_RELEASE_TABLET | Freq: Two times a day (BID) | ORAL | 0 refills | Status: DC

## 2023-08-14 DIAGNOSIS — J700 Acute pulmonary manifestations due to radiation: Secondary | ICD-10-CM | POA: Diagnosis not present

## 2023-08-14 DIAGNOSIS — G4733 Obstructive sleep apnea (adult) (pediatric): Secondary | ICD-10-CM | POA: Diagnosis not present

## 2023-08-14 DIAGNOSIS — J4550 Severe persistent asthma, uncomplicated: Secondary | ICD-10-CM | POA: Diagnosis not present

## 2023-08-14 DIAGNOSIS — Z87891 Personal history of nicotine dependence: Secondary | ICD-10-CM | POA: Diagnosis not present

## 2023-08-20 DIAGNOSIS — G4733 Obstructive sleep apnea (adult) (pediatric): Secondary | ICD-10-CM | POA: Diagnosis not present

## 2023-08-21 ENCOUNTER — Other Ambulatory Visit: Payer: Self-pay

## 2023-08-21 DIAGNOSIS — M479 Spondylosis, unspecified: Secondary | ICD-10-CM

## 2023-08-21 DIAGNOSIS — Z8739 Personal history of other diseases of the musculoskeletal system and connective tissue: Secondary | ICD-10-CM

## 2023-08-21 DIAGNOSIS — G8929 Other chronic pain: Secondary | ICD-10-CM

## 2023-08-21 DIAGNOSIS — M159 Polyosteoarthritis, unspecified: Secondary | ICD-10-CM

## 2023-08-21 MED ORDER — MORPHINE SULFATE ER 15 MG PO TBCR: 15.0000 mg | EXTENDED_RELEASE_TABLET | Freq: Two times a day (BID) | ORAL | 0 refills | Status: DC

## 2023-08-21 MED ORDER — OXYCODONE-ACETAMINOPHEN 10-325 MG PO TABS: 1.0000 | ORAL_TABLET | ORAL | 0 refills | Status: DC | PRN

## 2023-09-05 ENCOUNTER — Encounter: Payer: Self-pay | Admitting: Hematology and Oncology

## 2023-09-06 ENCOUNTER — Encounter: Payer: Self-pay | Admitting: Hematology and Oncology

## 2023-09-12 ENCOUNTER — Ambulatory Visit: Payer: 59 | Admitting: Cardiology

## 2023-09-16 ENCOUNTER — Other Ambulatory Visit: Payer: Self-pay

## 2023-09-16 DIAGNOSIS — C3412 Malignant neoplasm of upper lobe, left bronchus or lung: Secondary | ICD-10-CM

## 2023-09-16 NOTE — Addendum Note (Signed)
Addended by: Jeannette Corpus on: 09/16/2023 08:33 AM   Modules accepted: Orders

## 2023-09-18 ENCOUNTER — Other Ambulatory Visit: Payer: Self-pay

## 2023-09-18 DIAGNOSIS — Z8739 Personal history of other diseases of the musculoskeletal system and connective tissue: Secondary | ICD-10-CM

## 2023-09-18 DIAGNOSIS — M479 Spondylosis, unspecified: Secondary | ICD-10-CM

## 2023-09-18 DIAGNOSIS — G8929 Other chronic pain: Secondary | ICD-10-CM

## 2023-09-18 DIAGNOSIS — M159 Polyosteoarthritis, unspecified: Secondary | ICD-10-CM

## 2023-09-18 MED ORDER — OXYCODONE-ACETAMINOPHEN 10-325 MG PO TABS: 1.0000 | ORAL_TABLET | ORAL | 0 refills | Status: DC | PRN

## 2023-09-18 MED ORDER — MORPHINE SULFATE ER 15 MG PO TBCR: 15.0000 mg | EXTENDED_RELEASE_TABLET | Freq: Two times a day (BID) | ORAL | 0 refills | Status: DC

## 2023-09-19 ENCOUNTER — Encounter: Payer: Self-pay | Admitting: Cardiology

## 2023-09-22 ENCOUNTER — Ambulatory Visit: Payer: 59 | Admitting: Cardiology

## 2023-10-04 DIAGNOSIS — G4733 Obstructive sleep apnea (adult) (pediatric): Secondary | ICD-10-CM | POA: Diagnosis not present

## 2023-10-07 DIAGNOSIS — D509 Iron deficiency anemia, unspecified: Secondary | ICD-10-CM | POA: Diagnosis not present

## 2023-10-07 DIAGNOSIS — I509 Heart failure, unspecified: Secondary | ICD-10-CM | POA: Diagnosis not present

## 2023-10-07 DIAGNOSIS — E785 Hyperlipidemia, unspecified: Secondary | ICD-10-CM | POA: Diagnosis not present

## 2023-10-07 DIAGNOSIS — C3490 Malignant neoplasm of unspecified part of unspecified bronchus or lung: Secondary | ICD-10-CM | POA: Diagnosis not present

## 2023-10-07 DIAGNOSIS — Z8673 Personal history of transient ischemic attack (TIA), and cerebral infarction without residual deficits: Secondary | ICD-10-CM | POA: Diagnosis not present

## 2023-10-07 DIAGNOSIS — J439 Emphysema, unspecified: Secondary | ICD-10-CM | POA: Diagnosis not present

## 2023-10-07 DIAGNOSIS — G4700 Insomnia, unspecified: Secondary | ICD-10-CM | POA: Diagnosis not present

## 2023-10-09 ENCOUNTER — Inpatient Hospital Stay (HOSPITAL_BASED_OUTPATIENT_CLINIC_OR_DEPARTMENT_OTHER): Admission: RE | Admit: 2023-10-09 | Payer: 59 | Source: Ambulatory Visit | Admitting: Radiology

## 2023-10-13 ENCOUNTER — Other Ambulatory Visit: Payer: Self-pay

## 2023-10-13 DIAGNOSIS — M479 Spondylosis, unspecified: Secondary | ICD-10-CM

## 2023-10-13 DIAGNOSIS — G4733 Obstructive sleep apnea (adult) (pediatric): Secondary | ICD-10-CM | POA: Diagnosis not present

## 2023-10-13 DIAGNOSIS — G8929 Other chronic pain: Secondary | ICD-10-CM

## 2023-10-13 DIAGNOSIS — M159 Polyosteoarthritis, unspecified: Secondary | ICD-10-CM

## 2023-10-13 DIAGNOSIS — Z8739 Personal history of other diseases of the musculoskeletal system and connective tissue: Secondary | ICD-10-CM

## 2023-10-13 MED ORDER — MORPHINE SULFATE ER 15 MG PO TBCR: 15.0000 mg | EXTENDED_RELEASE_TABLET | Freq: Two times a day (BID) | ORAL | 0 refills | Status: DC

## 2023-10-13 MED ORDER — OXYCODONE-ACETAMINOPHEN 10-325 MG PO TABS: 1.0000 | ORAL_TABLET | ORAL | 0 refills | Status: DC | PRN

## 2023-10-14 ENCOUNTER — Encounter: Payer: Self-pay | Admitting: Hematology and Oncology

## 2023-10-23 DIAGNOSIS — G4733 Obstructive sleep apnea (adult) (pediatric): Secondary | ICD-10-CM | POA: Diagnosis not present

## 2023-10-23 DIAGNOSIS — J700 Acute pulmonary manifestations due to radiation: Secondary | ICD-10-CM | POA: Diagnosis not present

## 2023-10-23 DIAGNOSIS — Z87891 Personal history of nicotine dependence: Secondary | ICD-10-CM | POA: Diagnosis not present

## 2023-10-23 DIAGNOSIS — J4550 Severe persistent asthma, uncomplicated: Secondary | ICD-10-CM | POA: Diagnosis not present

## 2023-10-29 ENCOUNTER — Ambulatory Visit: Payer: 59 | Attending: Cardiology | Admitting: Cardiology

## 2023-10-29 ENCOUNTER — Encounter: Payer: Self-pay | Admitting: Cardiology

## 2023-10-29 VITALS — BP 136/68 | HR 79 | Ht 66.0 in | Wt 185.2 lb

## 2023-10-29 DIAGNOSIS — I2510 Atherosclerotic heart disease of native coronary artery without angina pectoris: Secondary | ICD-10-CM | POA: Diagnosis not present

## 2023-10-29 DIAGNOSIS — I5022 Chronic systolic (congestive) heart failure: Secondary | ICD-10-CM | POA: Diagnosis not present

## 2023-10-29 DIAGNOSIS — K551 Chronic vascular disorders of intestine: Secondary | ICD-10-CM

## 2023-10-29 DIAGNOSIS — E785 Hyperlipidemia, unspecified: Secondary | ICD-10-CM

## 2023-10-29 DIAGNOSIS — J449 Chronic obstructive pulmonary disease, unspecified: Secondary | ICD-10-CM

## 2023-10-29 MED ORDER — FUROSEMIDE 20 MG PO TABS: 20.0000 mg | ORAL_TABLET | Freq: Every day | ORAL | 3 refills | Status: DC

## 2023-10-29 MED ORDER — EZETIMIBE 10 MG PO TABS: 10.0000 mg | ORAL_TABLET | Freq: Every day | ORAL | 3 refills | Status: DC

## 2023-10-29 MED ORDER — ROSUVASTATIN CALCIUM 40 MG PO TABS: 40.0000 mg | ORAL_TABLET | Freq: Every day | ORAL | 3 refills | Status: AC

## 2023-10-29 NOTE — Addendum Note (Signed)
 Addended by: Roxanne Mins I on: 10/29/2023 10:44 AM   Modules accepted: Orders

## 2023-10-29 NOTE — Patient Instructions (Signed)
 Medication Instructions:  Your physician has recommended you make the following change in your medication:   START: Crestor 40 mg daily START: Zetia 10 mg daily  *If you need a refill on your cardiac medications before your next appointment, please call your pharmacy*   Lab Work: Your physician recommends that you return for lab work in:   Labs in 6 weeks: Lipids, LFT  If you have labs (blood work) drawn today and your tests are completely normal, you will receive your results only by: MyChart Message (if you have MyChart) OR A paper copy in the mail If you have any lab test that is abnormal or we need to change your treatment, we will call you to review the results.   Testing/Procedures: None   Follow-Up: At [facility], you and your health needs are our priority.  As part of our continuing mission to provide you with exceptional heart care, we have created designated Provider Care Teams.  These Care Teams include your primary Cardiologist (physician) and Advanced Practice Providers (APPs -  Physician Assistants and Nurse Practitioners) who all work together to provide you with the care you need, when you need it.  We recommend signing up for the patient portal called "MyChart".  Sign up information is provided on this After Visit Summary.  MyChart is used to connect with patients for Virtual Visits (Telemedicine).  Patients are able to view lab/test results, encounter notes, upcoming appointments, etc.  Non-urgent messages can be sent to your provider as well.   To learn more about what you can do with MyChart, go to [email or web address].    Your next appointment:   6 month(s)  Provider:   Gypsy Balsam, MD    Other Instructions None

## 2023-10-29 NOTE — Progress Notes (Signed)
 Cardiology Office Note:    Date:  10/29/2023   ID:  Ashley Miles, DOB 05-10-1946, MRN [id number]  PCP:  Jim Like, NP  Cardiologist:  Gypsy Balsam, MD    Referring MD: Jim Like, NP   Chief Complaint  Patient presents with   Follow-up    History of Present Illness:    Ashley Miles is a 78 y.o. female past medical history significant for coronary artery disease, status post non-STEMI in 2019, at that time she was found to have diminished left ventricle ejection fraction which eventually normalized, there are some issue of noncompliance she disappeared from follow-up for a while and now she is back in our scheduling.  She does have history of peripheral vascular disease initial assessment of carotic artery with 79% stenosis but after that test done showed only up to 49.  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  Comes today to months for follow-up.  Overall doing well.  Denies have any chest pain tightness squeezing pressure burning chest she does have some poor recollection of medication she takes her cholesterol checked show LDL of 121 which is obviously absolutely unacceptable however she tell me she does not take any cholesterol medication which is not true she takes Zetia as well as Crestor but at the same time she tells me she ran out of some medication I suspect that is the problem.  Past Medical History:  Diagnosis Date   Acute blood loss anemia    Acute kidney failure (HCC)    Acute systolic heart failure (HCC)    Anemia, unspecified 07/11/2020   Anxiety    Arthritis    Cardiogenic shock (HCC)    CHF (congestive heart failure) (HCC)    Chronic systolic heart failure (HCC) 03/03/2017   Cardiac catheterization 2018 showed diagonal branch occluded and right coronary artery stenosis however right coronary artery was very small, the feeling was that degree of coronary artery disease  is low to her ejection fraction.   Chronic vascular disorders of intestine (HCC) 04/23/2022   Cognitive communication deficit 04/22/2022   COPD (chronic obstructive pulmonary disease) (HCC)    Coronary artery disease 03/03/2017   Cardiac catheterization June 2018 showing 90% small RCA, completely occluded diagonal branch. Unsuccessful attempt to open up diagonal branch.   Deficiency anemia 07/11/2020   Degenerative joint disease involving multiple joints on both sides of body 09/20/2022   Severe DDD and DJD of spine     Drug-induced constipation    Dyslipidemia 04/03/2017   Folate deficiency anemia, unspecified 04/21/2022   Gastric ulcer    GERD (gastroesophageal reflux disease)    Hiatal hernia    Hyperlipidemia    Hypertension    Ischemic cardiomyopathy 05/21/2018   Long term (current) use of antithrombotics/antiplatelets 05/03/2022   Lung cancer (HCC) 03/03/2017   Muscle wasting and atrophy, not elsewhere classified, unspecified site 04/21/2022   NSTEMI (non-ST elevated myocardial infarction) (HCC) 01/09/2017   Oral phase dysphagia 04/21/2022   Osteopenia    Peripheral vascular disease, unspecified (HCC) 08/10/2021   Small cell lung cancer (HCC)    Smoking 11/20/2017   Stricture of artery (HCC) 04/23/2022   T12 compression fracture (HCC)    Tobacco use    Wedge compression fracture of first lumbar vertebra, subsequent encounter for fracture with routine healing 05/03/2022    Past Surgical History:  Procedure Laterality Date   CESAREAN SECTION  ESOPHAGOGASTRODUODENOSCOPY  02/17/2017   Healing of the gastic ulcer- no antral scar. Small hiatal hernia   LEFT HEART CATH AND CORONARY ANGIOGRAPHY N/A 01/09/2017   Procedure: Left Heart Cath and Coronary Angiography;  Surgeon: Kathleene Hazel, MD;  [facility] INVASIVE CV LAB;  Service: Cardiovascular;  Laterality: N/A;   LUNG CANCER SURGERY  2014    Current Medications: Current Meds  Medication Sig   albuterol  (PROVENTIL) (2.5 MG/3ML) 0.083% nebulizer solution Take 2.5 mg by nebulization every 6 (six) hours as needed for wheezing or shortness of breath.   albuterol (VENTOLIN HFA) 108 (90 Base) MCG/ACT inhaler Inhale 2 puffs into the lungs as needed for shortness of breath.   clopidogrel (PLAVIX) 75 MG tablet Take 1 tablet (75 mg total) by mouth daily.   Cyanocobalamin (VITAMIN B-12 PO) Take 1 tablet by mouth daily.   doxepin (SINEQUAN) 10 MG capsule Take 10 mg by mouth daily.   doxylamine, Sleep, (UNISOM) 25 MG tablet Take 25 mg by mouth at bedtime as needed for sleep.   ezetimibe (ZETIA) 10 MG tablet Take 1 tablet (10 mg total) by mouth daily.   Ferrous Sulfate (FEROSUL PO) Take 325 mg by mouth daily.   folic acid (FOLVITE) 1 MG tablet Take 1 tablet (1 mg total) by mouth daily.   furosemide (LASIX) 20 MG tablet Take 1 tablet (20 mg total) by mouth daily.   ipratropium-albuterol (DUONEB) 0.5-2.5 (3) MG/3ML SOLN Take 3 mLs by nebulization every 6 (six) hours as needed (SOB or wheezing).   lubiprostone (AMITIZA) 24 MCG capsule Take 24 mcg by mouth 2 (two) times daily with a meal.   metoprolol succinate (TOPROL-XL) 25 MG 24 hr tablet Take 25 mg by mouth daily.   morphine (MS CONTIN) 15 MG 12 hr tablet Take 1 tablet (15 mg total) by mouth every 12 (twelve) hours.   naloxegol oxalate (MOVANTIK) 25 MG TABS tablet Take 25 mg by mouth daily.   nitroGLYCERIN (NITROSTAT) 0.4 MG SL tablet Place 1 tablet (0.4 mg total) under the tongue every 5 (five) minutes x 3 doses as needed for chest pain.   ondansetron (ZOFRAN ODT) 4 MG disintegrating tablet 4mg  ODT q4 hours prn nausea/vomit (Patient taking differently: Take 4 mg by mouth every 4 (four) hours as needed for nausea or vomiting. 4mg  ODT q4 hours prn nausea/vomit)   oxyCODONE-acetaminophen (PERCOCET) 10-325 MG tablet Take 1 tablet by mouth every 4 (four) hours as needed for pain.   pantoprazole (PROTONIX) 40 MG tablet Take 40 mg by mouth 2 (two) times daily.    polyethylene glycol (MIRALAX / GLYCOLAX) packet Take 17 g by mouth daily.   potassium chloride (K-DUR) 10 MEQ tablet Take 10 mEq by mouth daily.   prednisoLONE acetate (PRED FORTE) 1 % ophthalmic suspension Place 1 drop into both eyes 4 (four) times daily.   prochlorperazine (COMPAZINE) 10 MG tablet Take 10 mg by mouth 3 (three) times daily as needed for nausea/vomiting, nausea or vomiting.   rosuvastatin (CRESTOR) 20 MG tablet Take 1 tablet (20 mg total) by mouth daily.   tiZANidine (ZANAFLEX) 4 MG tablet Take 4 mg by mouth at bedtime as needed for muscle spasms.   traZODone (DESYREL) 50 MG tablet Take 50 mg by mouth at bedtime.   TRELEGY ELLIPTA 200-62.5-25 MCG/ACT AEPB Inhale 1 puff into the lungs daily.   Vitamin D, Ergocalciferol, (DRISDOL) 50000 units CAPS capsule Take 50,000 Units by mouth every 7 (seven) days.     Allergies:   Patient  has no known allergies.   Social History   Socioeconomic History   Marital status: Divorced    Spouse name: Not on file   Number of children: Not on file   Years of education: Not on file   Highest education level: Not on file  Occupational History   Not on file  Tobacco Use   Smoking status: Former    Current packs/day: 0.00    Types: Cigarettes    Quit date: 06/05/2021    Years since quitting: 2.4   Smokeless tobacco: Never  Vaping Use   Vaping status: Former  Substance and Sexual Activity   Alcohol use: No   Drug use: No   Sexual activity: Not on file  Other Topics Concern   Not on file  Social History Narrative   Not on file   Social Drivers of Health   Financial Resource Strain: Not on file  Food Insecurity: Not on file  Transportation Needs: Not on file  Physical Activity: Not on file  Stress: Not on file  Social Connections: Not on file     Family History: The patient's family history includes Blindness in her mother; Cancer in her father; Hypertension in her father. There is no history of Colon cancer or Esophageal  cancer. ROS:   Please see the history of present illness.    All 14 point review of systems negative except as described per history of present illness  EKGs/Labs/Other Studies Reviewed:         Recent Labs: 01/15/2023: NT-Pro BNP 1,477 04/09/2023: ALT 15; BUN 18; Creatinine 1.69; Hemoglobin 11.5; Platelet Count 193; Potassium 4.7; Sodium 138  Recent Lipid Panel    Component Value Date/Time   CHOL 204 (H) 10/04/2022 1546   TRIG 230 (H) 10/04/2022 1546   HDL 50 10/04/2022 1546   CHOLHDL 4.1 10/04/2022 1546   CHOLHDL 5.0 01/11/2017 0521   VLDL 27 01/11/2017 0521   LDLCALC 114 (H) 10/04/2022 1546    Physical Exam:    VS:  BP 136/68 (BP Location: Left Arm, Patient Position: Sitting)   Pulse 79   Ht 5\' 6"  (1.676 m)   Wt 185 lb 3.2 oz (84 kg)   SpO2 94%   BMI 29.89 kg/m     Wt Readings from Last 3 Encounters:  10/29/23 185 lb 3.2 oz (84 kg)  06/18/23 186 lb 12.8 oz (84.7 kg)  04/09/23 181 lb 8 oz (82.3 kg)     GEN:  Well nourished, well developed in no acute distress HEENT: Normal NECK: No JVD; No carotid bruits LYMPHATICS: No lymphadenopathy CARDIAC: RRR, no murmurs, no rubs, no gallops RESPIRATORY:  Clear to auscultation without rales, wheezing or rhonchi  ABDOMEN: Soft, non-tender, non-distended MUSCULOSKELETAL:  No edema; No deformity  SKIN: Warm and dry LOWER EXTREMITIES: no swelling NEUROLOGIC:  Alert and oriented x 3 PSYCHIATRIC:  Normal affect   ASSESSMENT:    1. Chronic systolic heart failure (HCC)   2. Chronic vascular disorders of intestine (HCC)   3. Coronary artery disease involving native coronary artery of native heart without angina pectoris   4. Chronic obstructive pulmonary disease, unspecified COPD type (HCC)   5. Dyslipidemia    PLAN:    In order of problems listed above:  Chronic systolic congestive heart failure last echocardiogram showed normalization of left ventricular ejection fraction will continue monitoring, next year she need to  have echocardiogram done. Dyslipidemia LDL 121 HDL 43 this is reviewing of K PN from 10/07/2023 obesity absolutely unacceptable.  Will send her 1 more time prescription for Crestor 40+ Zetia.  She tells me she ran out of some medications. Coronary disease stable from that point review no signs and symptoms of reactivation problem. Chronic obstructive pulmonary disease.  Stable. Peripheral vascular disease followed by group from [facility] noncritical   Medication Adjustments/Labs and Tests Ordered: Current medicines are reviewed at length with the patient today.  Concerns regarding medicines are outlined above.  No orders of the defined types were placed in this encounter.  Medication changes: No orders of the defined types were placed in this encounter.   Signed, Georgeanna Lea, MD, [facility] 10/29/2023 10:26 AM    [facility]

## 2023-10-30 ENCOUNTER — Ambulatory Visit (HOSPITAL_BASED_OUTPATIENT_CLINIC_OR_DEPARTMENT_OTHER)
Admission: RE | Admit: 2023-10-30 | Discharge: 2023-10-30 | Disposition: A | Discharge status: Home / Self Care | Source: Ambulatory Visit | Attending: Oncology | Admitting: Oncology

## 2023-10-30 DIAGNOSIS — K5710 Diverticulosis of small intestine without perforation or abscess without bleeding: Secondary | ICD-10-CM | POA: Diagnosis not present

## 2023-10-30 DIAGNOSIS — C3412 Malignant neoplasm of upper lobe, left bronchus or lung: Secondary | ICD-10-CM | POA: Diagnosis not present

## 2023-10-30 DIAGNOSIS — N200 Calculus of kidney: Secondary | ICD-10-CM | POA: Diagnosis not present

## 2023-10-30 DIAGNOSIS — I700 Atherosclerosis of aorta: Secondary | ICD-10-CM | POA: Diagnosis not present

## 2023-10-30 LAB — I-STAT CREATININE (MANUAL ENTRY): Creatinine, Ser: 1.4 — AB (ref 0.50–1.10)

## 2023-10-30 MED ORDER — IOHEXOL 300 MG/ML  SOLN
100.0000 mL | Freq: Once | INTRAMUSCULAR | Status: AC | PRN
  Administered 2023-10-30: 80 mL via INTRAVENOUS

## 2023-11-12 ENCOUNTER — Other Ambulatory Visit: Payer: Self-pay

## 2023-11-12 DIAGNOSIS — G8929 Other chronic pain: Secondary | ICD-10-CM

## 2023-11-12 DIAGNOSIS — Z8739 Personal history of other diseases of the musculoskeletal system and connective tissue: Secondary | ICD-10-CM

## 2023-11-12 DIAGNOSIS — M479 Spondylosis, unspecified: Secondary | ICD-10-CM

## 2023-11-12 DIAGNOSIS — M159 Polyosteoarthritis, unspecified: Secondary | ICD-10-CM

## 2023-11-12 MED ORDER — OXYCODONE-ACETAMINOPHEN 10-325 MG PO TABS: 1.0000 | ORAL_TABLET | ORAL | 0 refills | Status: DC | PRN

## 2023-11-12 MED ORDER — MORPHINE SULFATE ER 15 MG PO TBCR
15.0000 mg | EXTENDED_RELEASE_TABLET | Freq: Two times a day (BID) | ORAL | 0 refills | Status: DC
Start: 2023-11-12 — End: 2023-12-09

## 2023-11-18 DIAGNOSIS — G4733 Obstructive sleep apnea (adult) (pediatric): Secondary | ICD-10-CM | POA: Diagnosis not present

## 2023-11-19 DIAGNOSIS — G4733 Obstructive sleep apnea (adult) (pediatric): Secondary | ICD-10-CM | POA: Diagnosis not present

## 2023-12-09 ENCOUNTER — Other Ambulatory Visit: Payer: Self-pay | Admitting: Hematology and Oncology

## 2023-12-09 DIAGNOSIS — Z8739 Personal history of other diseases of the musculoskeletal system and connective tissue: Secondary | ICD-10-CM

## 2023-12-09 DIAGNOSIS — G8929 Other chronic pain: Secondary | ICD-10-CM

## 2023-12-09 DIAGNOSIS — M159 Polyosteoarthritis, unspecified: Secondary | ICD-10-CM

## 2023-12-09 DIAGNOSIS — M479 Spondylosis, unspecified: Secondary | ICD-10-CM

## 2023-12-09 MED ORDER — MORPHINE SULFATE ER 15 MG PO TBCR: 15.0000 mg | EXTENDED_RELEASE_TABLET | Freq: Two times a day (BID) | ORAL | 0 refills | Status: DC

## 2023-12-09 MED ORDER — OXYCODONE-ACETAMINOPHEN 10-325 MG PO TABS
1.0000 | ORAL_TABLET | ORAL | 0 refills | Status: DC | PRN
Start: 2023-12-09 — End: 2024-01-06

## 2023-12-25 DIAGNOSIS — G4733 Obstructive sleep apnea (adult) (pediatric): Secondary | ICD-10-CM | POA: Diagnosis not present

## 2024-01-04 DIAGNOSIS — G4733 Obstructive sleep apnea (adult) (pediatric): Secondary | ICD-10-CM | POA: Diagnosis not present

## 2024-01-06 ENCOUNTER — Other Ambulatory Visit: Payer: Self-pay

## 2024-01-06 DIAGNOSIS — Z8739 Personal history of other diseases of the musculoskeletal system and connective tissue: Secondary | ICD-10-CM

## 2024-01-06 DIAGNOSIS — M159 Polyosteoarthritis, unspecified: Secondary | ICD-10-CM

## 2024-01-06 DIAGNOSIS — G8929 Other chronic pain: Secondary | ICD-10-CM

## 2024-01-06 DIAGNOSIS — M479 Spondylosis, unspecified: Secondary | ICD-10-CM

## 2024-01-06 MED ORDER — MORPHINE SULFATE ER 15 MG PO TBCR: 15.0000 mg | EXTENDED_RELEASE_TABLET | Freq: Two times a day (BID) | ORAL | 0 refills | Status: DC

## 2024-01-06 MED ORDER — OXYCODONE-ACETAMINOPHEN 10-325 MG PO TABS: 1.0000 | ORAL_TABLET | ORAL | 0 refills | Status: DC | PRN

## 2024-01-07 ENCOUNTER — Encounter: Payer: Self-pay | Admitting: Hematology and Oncology

## 2024-01-07 NOTE — Progress Notes (Signed)
 Lab appointment, see other note for office visit

## 2024-01-08 DIAGNOSIS — E538 Deficiency of other specified B group vitamins: Secondary | ICD-10-CM | POA: Diagnosis not present

## 2024-01-08 DIAGNOSIS — E785 Hyperlipidemia, unspecified: Secondary | ICD-10-CM | POA: Diagnosis not present

## 2024-01-08 DIAGNOSIS — G4700 Insomnia, unspecified: Secondary | ICD-10-CM | POA: Diagnosis not present

## 2024-01-08 DIAGNOSIS — I509 Heart failure, unspecified: Secondary | ICD-10-CM | POA: Diagnosis not present

## 2024-01-08 DIAGNOSIS — D509 Iron deficiency anemia, unspecified: Secondary | ICD-10-CM | POA: Diagnosis not present

## 2024-01-08 DIAGNOSIS — Z1231 Encounter for screening mammogram for malignant neoplasm of breast: Secondary | ICD-10-CM | POA: Diagnosis not present

## 2024-01-08 DIAGNOSIS — Z8673 Personal history of transient ischemic attack (TIA), and cerebral infarction without residual deficits: Secondary | ICD-10-CM | POA: Diagnosis not present

## 2024-01-08 DIAGNOSIS — J439 Emphysema, unspecified: Secondary | ICD-10-CM | POA: Diagnosis not present

## 2024-01-08 DIAGNOSIS — E559 Vitamin D deficiency, unspecified: Secondary | ICD-10-CM | POA: Diagnosis not present

## 2024-01-08 DIAGNOSIS — C3490 Malignant neoplasm of unspecified part of unspecified bronchus or lung: Secondary | ICD-10-CM | POA: Diagnosis not present

## 2024-02-02 ENCOUNTER — Other Ambulatory Visit: Payer: Self-pay

## 2024-02-02 DIAGNOSIS — Z8739 Personal history of other diseases of the musculoskeletal system and connective tissue: Secondary | ICD-10-CM

## 2024-02-02 DIAGNOSIS — I2510 Atherosclerotic heart disease of native coronary artery without angina pectoris: Secondary | ICD-10-CM | POA: Diagnosis not present

## 2024-02-02 DIAGNOSIS — G8929 Other chronic pain: Secondary | ICD-10-CM

## 2024-02-02 DIAGNOSIS — G4700 Insomnia, unspecified: Secondary | ICD-10-CM | POA: Diagnosis not present

## 2024-02-02 DIAGNOSIS — M479 Spondylosis, unspecified: Secondary | ICD-10-CM

## 2024-02-02 DIAGNOSIS — M159 Polyosteoarthritis, unspecified: Secondary | ICD-10-CM

## 2024-02-03 ENCOUNTER — Encounter: Payer: Self-pay | Admitting: Hematology and Oncology

## 2024-02-03 MED ORDER — MORPHINE SULFATE ER 15 MG PO TBCR: 15.0000 mg | EXTENDED_RELEASE_TABLET | Freq: Two times a day (BID) | ORAL | 0 refills | Status: DC

## 2024-02-03 MED ORDER — OXYCODONE-ACETAMINOPHEN 10-325 MG PO TABS: 1.0000 | ORAL_TABLET | ORAL | 0 refills | Status: DC | PRN

## 2024-03-01 ENCOUNTER — Telehealth: Payer: Self-pay

## 2024-03-01 ENCOUNTER — Other Ambulatory Visit: Payer: Self-pay

## 2024-03-01 DIAGNOSIS — Z8739 Personal history of other diseases of the musculoskeletal system and connective tissue: Secondary | ICD-10-CM

## 2024-03-01 DIAGNOSIS — M159 Polyosteoarthritis, unspecified: Secondary | ICD-10-CM

## 2024-03-01 DIAGNOSIS — M479 Spondylosis, unspecified: Secondary | ICD-10-CM

## 2024-03-01 DIAGNOSIS — G8929 Other chronic pain: Secondary | ICD-10-CM

## 2024-03-01 NOTE — Telephone Encounter (Signed)
 When pt called to ask for refills, she also mentioned she hasn't been here in a while. She said you normally see her every 6 months.

## 2024-03-02 ENCOUNTER — Encounter: Payer: Self-pay | Admitting: Hematology and Oncology

## 2024-03-02 MED ORDER — OXYCODONE-ACETAMINOPHEN 10-325 MG PO TABS: 1.0000 | ORAL_TABLET | ORAL | 0 refills | Status: DC | PRN

## 2024-03-02 MED ORDER — MORPHINE SULFATE ER 15 MG PO TBCR: 15.0000 mg | EXTENDED_RELEASE_TABLET | Freq: Two times a day (BID) | ORAL | 0 refills | Status: DC

## 2024-03-02 NOTE — Telephone Encounter (Signed)
 Patient has been scheduled. Aware of appt date and time.

## 2024-03-12 ENCOUNTER — Other Ambulatory Visit: Payer: Self-pay | Admitting: Oncology

## 2024-03-12 ENCOUNTER — Encounter: Payer: Self-pay | Admitting: Oncology

## 2024-03-12 ENCOUNTER — Inpatient Hospital Stay (HOSPITAL_BASED_OUTPATIENT_CLINIC_OR_DEPARTMENT_OTHER): Admitting: Oncology

## 2024-03-12 ENCOUNTER — Inpatient Hospital Stay: Attending: Oncology

## 2024-03-12 VITALS — BP 146/67 | HR 88 | Temp 98.1°F | Resp 18 | Ht 66.0 in | Wt 189.3 lb

## 2024-03-12 DIAGNOSIS — C3412 Malignant neoplasm of upper lobe, left bronchus or lung: Secondary | ICD-10-CM

## 2024-03-12 DIAGNOSIS — Z923 Personal history of irradiation: Secondary | ICD-10-CM | POA: Insufficient documentation

## 2024-03-12 DIAGNOSIS — D649 Anemia, unspecified: Secondary | ICD-10-CM | POA: Diagnosis not present

## 2024-03-12 DIAGNOSIS — Z85118 Personal history of other malignant neoplasm of bronchus and lung: Secondary | ICD-10-CM | POA: Diagnosis not present

## 2024-03-12 DIAGNOSIS — Z9221 Personal history of antineoplastic chemotherapy: Secondary | ICD-10-CM | POA: Insufficient documentation

## 2024-03-12 DIAGNOSIS — I3139 Other pericardial effusion (noninflammatory): Secondary | ICD-10-CM | POA: Diagnosis not present

## 2024-03-12 DIAGNOSIS — Z08 Encounter for follow-up examination after completed treatment for malignant neoplasm: Secondary | ICD-10-CM | POA: Insufficient documentation

## 2024-03-12 LAB — CMP (CANCER CENTER ONLY)
ALT: 12 U/L (ref 0–44)
AST: 23 U/L (ref 15–41)
Albumin: 4.1 g/dL (ref 3.5–5.0)
Alkaline Phosphatase: 60 U/L (ref 38–126)
Anion gap: 13 (ref 5–15)
BUN: 16 mg/dL (ref 8–23)
CO2: 27 mmol/L (ref 22–32)
Calcium: 9.5 mg/dL (ref 8.9–10.3)
Chloride: 99 mmol/L (ref 98–111)
Creatinine: 1.39 mg/dL — ABNORMAL HIGH (ref 0.44–1.00)
GFR, Estimated: 39 mL/min — ABNORMAL LOW (ref >60)
Glucose, Bld: 125 mg/dL — ABNORMAL HIGH (ref 70–99)
Potassium: 4.6 mmol/L (ref 3.5–5.1)
Sodium: 139 mmol/L (ref 135–145)
Total Bilirubin: 0.2 mg/dL (ref 0.0–1.2)
Total Protein: 7.5 g/dL (ref 6.5–8.1)

## 2024-03-12 LAB — CBC WITH DIFFERENTIAL (CANCER CENTER ONLY)
Abs Immature Granulocytes: 0.04 K/uL (ref 0.00–0.07)
Basophils Absolute: 0.1 K/uL (ref 0.0–0.1)
Basophils Relative: 1 %
Eosinophils Absolute: 0.3 K/uL (ref 0.0–0.5)
Eosinophils Relative: 3 %
HCT: 36.3 % (ref 36.0–46.0)
Hemoglobin: 11.7 g/dL — ABNORMAL LOW (ref 12.0–15.0)
Immature Granulocytes: 0 %
Lymphocytes Relative: 19 %
Lymphs Abs: 1.9 K/uL (ref 0.7–4.0)
MCH: 29.7 pg (ref 26.0–34.0)
MCHC: 32.2 g/dL (ref 30.0–36.0)
MCV: 92.1 fL (ref 80.0–100.0)
Monocytes Absolute: 1.1 K/uL — ABNORMAL HIGH (ref 0.1–1.0)
Monocytes Relative: 11 %
Neutro Abs: 6.6 K/uL (ref 1.7–7.7)
Neutrophils Relative %: 66 %
Platelet Count: 262 K/uL (ref 150–400)
RBC: 3.94 MIL/uL (ref 3.87–5.11)
RDW: 13.4 % (ref 11.5–15.5)
WBC Count: 10.1 K/uL (ref 4.0–10.5)
nRBC: 0 % (ref 0.0–0.2)

## 2024-03-12 NOTE — Progress Notes (Signed)
 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 She was treated with concurrent radiation and chemotherapy, which included 6 cycles of carboplatin and Etoposide, that was completed in October 2014. She has restaging scans every 3 months that have remained stable.  A PET scan in March 2015 showed only mild to moderate metabolic activity in the area of her original tumor.  She does have degenerative disc disease and osteoarthritis with chronic bone pain.  We have agreed to prescribe pain medication for her as she is unable to obtain a primary care provider.  In addition, she has significant osteopenia and was placed on Fosamax along with calcium  and vitamin-D.  She continues to complain of lower back pain, and rates it as an 8 and constant..  She states the Percocet 10/325 gives her relief but just for a short period of time.  In June of 2017, she was complaining of a catch under both breasts and some increased cough with occasional chest pain.  At that time her CT scan did show progressive collapse and consolidation of the left upper lobe and so a PET scan was done to evaluate this further.  This revealed mild hypermetabolism within the posterior left upper hemithorax consistent with radiation treatment changes and appeared relatively uniform with no areas of intense uptake suspicious for malignancy.  In December of 2017, she had a rib fracture that was found on x-ray at urgent care. She was also put on MS Contin  15mg  bid. She was seen by Dr. Lynnie Bring and found to have a large gastric ulcer with bleeding.   She was placed on Protonix  twice daily and had a follow-up upper endoscopy in July of 2018, which showed good healing of her ulcer, now consistent with an antral scar, and a small hiatal hernia. .  When I saw her in April, her hemoglobin was up to 11.3 but was back down to 9.1 on July 16th.  Biopsies revealed a reactive gastropathy and was negative for H pylori.  She is on iron supplement. She has had 2 myocardial infarctions, one in March of 2019 and another in June, and was transferred to [facility] in [place].  A chest x-ray done at that time showed just a chronic opacity at the left lung apex consistent with her treated lung cancer.  She also had an echocardiogram which revealed severe impairment of her left ventricular function with an ejection fraction of 30-35%.  She had akinesis of multiple areas of the myocardium.    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  It also revealed a small stable pericardial effusion.  ECHO from July 2021 revealed a good ejection fraction of 60-65% and small stable pericardial effusion.  CT chest from November 2021 revealed stable dense radiation fibrosis involving the left paramediastinal lung and left hilum with no findings suspicious for recurrent tumor. Annual mammogram from December 2021 was negative. CT chest from September 26, 2021 reveals stable dense radiation fibrosis involving the  left upper lobe and left hilum. No findings suspicious for recurrent tumor. No mediastinal or hilar mass or adenopathy. Stable small bilateral adrenal gland nodules, likely benign adenomas. CT chest in February of 2024 remained stable other than intrahepatic and extrahepatic biliary duct dilatation. CT of abdomen and pelvis revealed this to be mild and did not reveal the etiology.  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 We have not seen her in the office since 04/09/2023. She did have her annual CT of the chest in March but never made it in for the appointment. Patient states that she feels poorly, especially with the recent passing of her son, 8 years old who had a motorcycle accident. She complains of chronic pain and dyspnea, especially with mild exertion. She had a CT chest, abdomen, and pelvis performed on 10/30/2023 which revealed radiation changes in the medial left hemithorax and no evidence of metastatic disease. She has a WBC of 10.1, a slightly low hemoglobin of 11.7 improved from 11.5, and platelet count of 262,000. Her CMP is normal other than an elevated creatinine of 1.39. She denies ulcers. She will have her port flushed today. I will see her back in 7-8 months with CBC, CMP, and CT chest  She denies fever, chills, night sweats, or other signs of infection. She denies cardiorespiratory and gastrointestinal issues. She  denies pain. Her appetite is good and Her weight is 189lb today.  REVIEW OF SYSTEMS:  Review of Systems  Constitutional: Negative.  Negative for appetite change, chills, diaphoresis, fatigue, fever and unexpected weight change.  HENT:  Negative.  Negative for hearing loss, lump/mass, mouth sores, nosebleeds, sore throat, tinnitus, trouble swallowing and voice change.   Eyes: Negative.  Negative for eye problems and icterus.  Respiratory:  Positive for shortness of breath (severe even with mild exertion). Negative for chest tightness, cough, hemoptysis and wheezing.   Cardiovascular: Negative.  Negative for chest pain, leg swelling and palpitations.  Gastrointestinal: Negative.  Negative for abdominal distention, abdominal pain, blood in stool, constipation, diarrhea, nausea, rectal pain and vomiting.  Endocrine: Negative.   Genitourinary: Negative.  Negative for bladder incontinence, difficulty urinating, dyspareunia,  dysuria, frequency, hematuria, menstrual problem, nocturia, pelvic pain, vaginal bleeding and vaginal discharge.   Musculoskeletal:  Positive for back pain (spinal stenosis) and gait problem (uses a walker to ambulate). Negative for arthralgias, flank pain, myalgias, neck pain and neck stiffness.       Chronic pain  Skin: Negative.  Negative for itching, rash and wound.  Neurological:  Positive for extremity weakness and gait problem (uses a walker to ambulate). Negative for dizziness, headaches, light-headedness, numbness, seizures and speech difficulty.  Hematological: Negative.  Negative for adenopathy. Does not bruise/bleed easily.  Psychiatric/Behavioral: Negative.  Negative for confusion, decreased concentration, depression, sleep disturbance and suicidal ideas. The patient is not nervous/anxious.     VITALS:  Blood pressure (!) 146/67, pulse 88, temperature 98.1 F (36.7 C), temperature source Oral, resp. rate 18, height 5' 6 (1.676 m), weight 189 lb 4.8 oz (85.9 kg), SpO2 96%.  Wt Readings from Last 3 Encounters:  03/12/24 189 lb 4.8 oz (85.9 kg)  10/29/23 185 lb 3.2 oz (84 kg)  06/18/23 186 lb 12.8 oz (84.7 kg)    Body mass index is 30.55 kg/m.  Performance status (ECOG): 1 - Symptomatic but completely ambulatory  PHYSICAL EXAM:  Physical Exam Vitals and nursing note  reviewed.  Constitutional:      General: She is not in acute distress.    Appearance: Normal appearance. She is normal weight. She is not ill-appearing, toxic-appearing or diaphoretic.  HENT:     Head: Normocephalic and atraumatic.     Right Ear: Tympanic membrane, ear canal and external ear normal. There is no impacted cerumen.     Left Ear: Tympanic membrane, ear canal and external ear normal. There is no impacted cerumen.     Nose: Nose normal. No congestion or rhinorrhea.     Mouth/Throat:     Mouth: Mucous membranes are moist.     Pharynx: Oropharynx is clear. No oropharyngeal exudate or posterior  oropharyngeal erythema.  Eyes:     General: No scleral icterus.       Right eye: No discharge.        Left eye: No discharge.     Extraocular Movements: Extraocular movements intact.     Conjunctiva/sclera: Conjunctivae normal.     Pupils: Pupils are equal, round, and reactive to light.  Neck:     Vascular: No carotid bruit.  Cardiovascular:     Rate and Rhythm: Normal rate and regular rhythm.     Pulses: Normal pulses.     Heart sounds: Murmur heard.     Systolic murmur is present with a grade of 1/6.     No friction rub. No gallop.  Pulmonary:     Effort: Pulmonary effort is normal. No respiratory distress.     Breath sounds: Normal breath sounds. No stridor. No wheezing, rhonchi or rales.  Chest:     Chest wall: No tenderness.  Abdominal:     General: Bowel sounds are normal. There is no distension.     Palpations: Abdomen is soft. There is no hepatomegaly, splenomegaly or mass.     Tenderness: There is no abdominal tenderness. There is no right CVA tenderness, left CVA tenderness, guarding or rebound.     Hernia: No hernia is present.  Musculoskeletal:        General: No swelling, tenderness, deformity or signs of injury. Normal range of motion.     Cervical back: Normal range of motion and neck supple. No rigidity or tenderness.     Right lower leg: No edema.     Left lower leg: No edema.  Lymphadenopathy:     Cervical: No cervical adenopathy.  Skin:    General: Skin is warm and dry.     Coloration: Skin is not jaundiced or pale.     Findings: No bruising, erythema, lesion or rash.  Neurological:     General: No focal deficit present.     Mental Status: She is alert and oriented to person, place, and time. Mental status is at baseline.     Cranial Nerves: No cranial nerve deficit.     Sensory: No sensory deficit.     Motor: No weakness.     Coordination: Coordination normal.     Gait: Gait abnormal (uses walker).     Deep Tendon Reflexes: Reflexes normal.   Psychiatric:        Mood and Affect: Mood normal.        Behavior: Behavior normal.        Thought Content: Thought content normal.        Judgment: Judgment normal.    LABS:      Latest Ref Rng & Units 03/12/2024    3:30 PM 04/09/2023    4:01 PM 09/25/2022  12:00 AM  CBC  WBC 4.0 - 10.5 K/uL 10.1  9.3  8.0      Hemoglobin 12.0 - 15.0 g/dL 88.2  88.4  88.5      Hematocrit 36.0 - 46.0 % 36.3  36.2  34      Platelets 150 - 400 K/uL 262  193  347         This result is from an external source.      Latest Ref Rng & Units 03/12/2024    3:30 PM 10/30/2023    5:07 PM 04/09/2023    4:01 PM  CMP  Glucose 70 - 99 mg/dL 874   876   BUN 8 - 23 mg/dL 16   18   Creatinine 9.55 - 1.00 mg/dL 8.60  8.59  8.30   Sodium 135 - 145 mmol/L 139   138   Potassium 3.5 - 5.1 mmol/L 4.6   4.7   Chloride 98 - 111 mmol/L 99   103   CO2 22 - 32 mmol/L 27   25   Calcium  8.9 - 10.3 mg/dL 9.5   9.6   Total Protein 6.5 - 8.1 g/dL 7.5   7.6   Total Bilirubin 0.0 - 1.2 mg/dL 0.2   0.3   Alkaline Phos 38 - 126 U/L 60   57   AST 15 - 41 U/L 23   20   ALT 0 - 44 U/L 12   15    Component Ref Range & Units 01/15/2023  NT-Pro BNP 0 - 738 pg/mL 1,477 High    Lab Results  Component Value Date   CEA1 2.1 03/27/2022   CEA 1.3 09/25/2022   /  CEA  Date Value Ref Range Status  09/25/2022 1.3  Final  03/27/2022 2.1 0.0 - 4.7 ng/mL Final    Comment:    (NOTE)                             Nonsmokers          <3.9                             Smokers             <5.6 [employer] Electrochemiluminescence Immunoassay (ECLIA) Values obtained with different assay methods or kits cannot be used interchangeably.  Results cannot be interpreted as absolute evidence of the presence or absence of malignant disease. Performed At: [facility] [address] [place], [place] [id number] Jennette Shorter MD Ey:[phone]    STUDIES:  EXAM: 10/30/2023 CT CHEST, ABDOMEN, AND PELVIS WITH  CONTRAST IMPRESSION: Radiation changes in the medial left hemithorax. No evidence of recurrent or metastatic disease.       Exam:  09/25/2022 Ct Chest with Contrast Impression: Post treatment changes in the left hemithorax without evidence of recurrent or metastatic disease. Moderate pericardial effusion, stable. Intrahepatic and extrahepatic bilary ductal dilation appears increased from 04/17/2022. Please correlate clinically and consider right upper quadrant ultrasound or CT abdomen with contrast in further evaluation, as clinically indicated.  Aortic Atherosclerosis (ICD10-170.0) Coronary Artery calcification.  Emphysema (ICD10-J43.9)  EXAM: 09/26/2021 CT CHEST WITH CONTRAST  IMPRESSION:  1. Stable dense radiation fibrosis involving the left upper lobe and  left hilum. No findings suspicious for recurrent tumor.  2. No mediastinal or hilar mass or adenopathy.  3. Stable 3 mm subpleural pulmonary nodule in the  right lower lobe.  4. Stable advanced vascular calcifications.  5. Stable small bilateral adrenal gland nodules, likely benign  adenomas.    Allergies: No Known Allergies  Current Medications: Current Outpatient Medications  Medication Sig Dispense Refill   albuterol  (PROVENTIL ) (2.5 MG/3ML) 0.083% nebulizer solution Take 2.5 mg by nebulization every 6 (six) hours as needed for wheezing or shortness of breath.     albuterol  (VENTOLIN  HFA) 108 (90 Base) MCG/ACT inhaler Inhale 2 puffs into the lungs as needed for shortness of breath.     clopidogrel  (PLAVIX ) 75 MG tablet Take 1 tablet (75 mg total) by mouth daily. 30 tablet 6   Cyanocobalamin  (VITAMIN B-12 PO) Take 1 tablet by mouth daily.     doxepin (SINEQUAN) 10 MG capsule Take 10 mg by mouth daily.     doxylamine, Sleep, (UNISOM) 25 MG tablet Take 25 mg by mouth at bedtime as needed for sleep.     ezetimibe  (ZETIA ) 10 MG tablet Take 1 tablet (10 mg total) by mouth daily. 90 tablet 3   Ferrous Sulfate (FEROSUL PO)  Take 325 mg by mouth daily.     folic acid  (FOLVITE ) 1 MG tablet Take 1 tablet (1 mg total) by mouth daily. 30 tablet 5   furosemide  (LASIX ) 20 MG tablet Take 1 tablet (20 mg total) by mouth daily. 90 tablet 3   ipratropium-albuterol  (DUONEB) 0.5-2.5 (3) MG/3ML SOLN Take 3 mLs by nebulization every 6 (six) hours as needed (SOB or wheezing).     lubiprostone (AMITIZA) 24 MCG capsule Take 24 mcg by mouth 2 (two) times daily with a meal.     metoprolol  succinate (TOPROL -XL) 25 MG 24 hr tablet Take 25 mg by mouth daily.     morphine  (MS CONTIN ) 15 MG 12 hr tablet Take 1 tablet (15 mg total) by mouth every 12 (twelve) hours. 60 tablet 0   naloxegol oxalate  (MOVANTIK ) 25 MG TABS tablet Take 25 mg by mouth daily.     nitroGLYCERIN  (NITROSTAT ) 0.4 MG SL tablet Place 1 tablet (0.4 mg total) under the tongue every 5 (five) minutes x 3 doses as needed for chest pain. 25 tablet 12   ondansetron  (ZOFRAN  ODT) 4 MG disintegrating tablet 4mg  ODT q4 hours prn nausea/vomit (Patient taking differently: Take 4 mg by mouth every 4 (four) hours as needed for nausea or vomiting. 4mg  ODT q4 hours prn nausea/vomit) 8 tablet 0   oxyCODONE -acetaminophen  (PERCOCET) 10-325 MG tablet Take 1 tablet by mouth every 4 (four) hours as needed for pain. 120 tablet 0   pantoprazole  (PROTONIX ) 40 MG tablet Take 40 mg by mouth 2 (two) times daily.     polyethylene glycol (MIRALAX / GLYCOLAX) packet Take 17 g by mouth daily.     potassium chloride  (K-DUR) 10 MEQ tablet Take 10 mEq by mouth daily.     prednisoLONE acetate (PRED FORTE) 1 % ophthalmic suspension Place 1 drop into both eyes 4 (four) times daily.     prochlorperazine (COMPAZINE) 10 MG tablet Take 10 mg by mouth 3 (three) times daily as needed for nausea/vomiting, nausea or vomiting.     rosuvastatin  (CRESTOR ) 40 MG tablet Take 1 tablet (40 mg total) by mouth daily. 90 tablet 3   tiZANidine (ZANAFLEX) 4 MG tablet Take 4 mg by mouth at bedtime as needed for muscle spasms.      traZODone (DESYREL) 50 MG tablet Take 50 mg by mouth at bedtime.     TRELEGY ELLIPTA 200-62.5-25 MCG/ACT AEPB Inhale 1 puff into  the lungs daily.     Vitamin D , Ergocalciferol , (DRISDOL) 50000 units CAPS capsule Take 50,000 Units by mouth every 7 (seven) days.     No current facility-administered medications for this visit.     ASSESSMENT & PLAN:  Assessment:   1.  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  This was treated with chemotherapy and radiation.  She remains without evidence of disease for over 10 years.    2.  Tiny stable pulmonary nodules, stable.  3.  Chronic back pain from severe degenerative disc disease and osteoarthritis.  She continues oxycodone  10/325 with fairly good control of her pain.  4.  Stable, moderate pericardial effusion.  ECHO from July 2021 revealed an EF between 60-65%.  Her cardiologist Dr. Bernie ordered a diuretic.  5.  COPD with severe dyspnea. She finally quit smoking in November 2022.  Dr. Mardee has diagnosed sleep apnea and she now uses a CPAP mask.  6.  Anemia.  This is mild and normochromic, normocytic. It has improved somewhat with folate supplement.  Plan: She complains of chronic pain and dyspnea, especially with mild exertion. She had a CT chest, abdomen, and pelvis performed on 10/30/2023 which revealed radiation changes in the medial left hemithorax and no evidence of metastatic disease. She has a WBC of 10.1, a slightly low hemoglobin of 11.7 improved from 11.5, and platelet count of 262,000. Her CMP is normal other than an elevated creatinine of 1.39. She denies ulcers. She will have her port flushed today. I will see her back in 7-8 months with CBC, CMP, and CT chest. She understands and agrees with this plan of care.   I provided 10 minutes of face-to-face time during this this encounter and > 50% was spent counseling as documented under my assessment and plan.    Wanda VEAR Cornish, MD [facility] AT [facility] [address] [place] [address] Dept: [phone] Dept Fax: [phone]   I,Liala Codispoti H Rockland Kotarski,acting as a scribe for Wanda VEAR Cornish, MD.,have documented all relevant documentation on the behalf of Wanda VEAR Cornish, MD,as directed by  Wanda VEAR Cornish, MD while in the presence of Wanda VEAR Cornish, MD.

## 2024-03-24 ENCOUNTER — Encounter: Payer: Self-pay | Admitting: Hematology and Oncology

## 2024-03-31 ENCOUNTER — Other Ambulatory Visit: Payer: Self-pay

## 2024-03-31 DIAGNOSIS — Z8739 Personal history of other diseases of the musculoskeletal system and connective tissue: Secondary | ICD-10-CM

## 2024-03-31 DIAGNOSIS — M479 Spondylosis, unspecified: Secondary | ICD-10-CM

## 2024-03-31 DIAGNOSIS — M159 Polyosteoarthritis, unspecified: Secondary | ICD-10-CM

## 2024-03-31 DIAGNOSIS — G8929 Other chronic pain: Secondary | ICD-10-CM

## 2024-03-31 MED ORDER — OXYCODONE-ACETAMINOPHEN 10-325 MG PO TABS: 1.0000 | ORAL_TABLET | ORAL | 0 refills | Status: DC | PRN

## 2024-03-31 MED ORDER — MORPHINE SULFATE ER 15 MG PO TBCR: 15.0000 mg | EXTENDED_RELEASE_TABLET | Freq: Two times a day (BID) | ORAL | 0 refills | Status: DC

## 2024-04-28 ENCOUNTER — Other Ambulatory Visit: Payer: Self-pay

## 2024-04-28 ENCOUNTER — Telehealth: Payer: Self-pay

## 2024-04-28 DIAGNOSIS — M159 Polyosteoarthritis, unspecified: Secondary | ICD-10-CM

## 2024-04-28 DIAGNOSIS — Z8739 Personal history of other diseases of the musculoskeletal system and connective tissue: Secondary | ICD-10-CM

## 2024-04-28 DIAGNOSIS — M479 Spondylosis, unspecified: Secondary | ICD-10-CM

## 2024-04-28 DIAGNOSIS — G8929 Other chronic pain: Secondary | ICD-10-CM

## 2024-04-28 MED ORDER — MORPHINE SULFATE ER 15 MG PO TBCR: 15.0000 mg | EXTENDED_RELEASE_TABLET | Freq: Two times a day (BID) | ORAL | 0 refills | Status: DC

## 2024-04-28 MED ORDER — OXYCODONE-ACETAMINOPHEN 10-325 MG PO TABS: 1.0000 | ORAL_TABLET | ORAL | 0 refills | Status: DC | PRN

## 2024-04-28 NOTE — Telephone Encounter (Signed)
 Opened in error

## 2024-05-03 ENCOUNTER — Ambulatory Visit: Attending: Cardiology | Admitting: Cardiology

## 2024-05-03 ENCOUNTER — Encounter: Payer: Self-pay | Admitting: Cardiology

## 2024-05-03 VITALS — BP 140/78 | HR 87 | Ht 66.0 in | Wt 197.0 lb

## 2024-05-03 DIAGNOSIS — I5042 Chronic combined systolic (congestive) and diastolic (congestive) heart failure: Secondary | ICD-10-CM

## 2024-05-03 DIAGNOSIS — C3412 Malignant neoplasm of upper lobe, left bronchus or lung: Secondary | ICD-10-CM

## 2024-05-03 DIAGNOSIS — R0609 Other forms of dyspnea: Secondary | ICD-10-CM

## 2024-05-03 DIAGNOSIS — E785 Hyperlipidemia, unspecified: Secondary | ICD-10-CM

## 2024-05-03 DIAGNOSIS — I739 Peripheral vascular disease, unspecified: Secondary | ICD-10-CM

## 2024-05-03 DIAGNOSIS — I2510 Atherosclerotic heart disease of native coronary artery without angina pectoris: Secondary | ICD-10-CM

## 2024-05-03 DIAGNOSIS — I255 Ischemic cardiomyopathy: Secondary | ICD-10-CM

## 2024-05-03 DIAGNOSIS — I5021 Acute systolic (congestive) heart failure: Secondary | ICD-10-CM

## 2024-05-03 MED ORDER — POTASSIUM CHLORIDE ER 10 MEQ PO TBCR: 20.0000 meq | EXTENDED_RELEASE_TABLET | Freq: Every day | ORAL | 3 refills | Status: AC

## 2024-05-03 MED ORDER — FUROSEMIDE 20 MG PO TABS: 40.0000 mg | ORAL_TABLET | Freq: Every day | ORAL | 3 refills | Status: DC

## 2024-05-03 NOTE — Addendum Note (Signed)
 Addended by: ARLOA PLANAS D on: 05/03/2024 04:11 PM   Modules accepted: Orders

## 2024-05-03 NOTE — Patient Instructions (Addendum)
 Medication Instructions:   INCREASE: Lasix  to 40mg  daily  INCREASE: Potassium to 20mEq daily   Lab Work: Your physician recommends that you return for lab work in: 1 week You need to have labs done when you are fasting.  You can come Monday through Friday 8:30 am to 12:00 pm and 1:15 to 4:30. You do not need to make an appointment as the order has already been placed. The labs you are going to have done are BMP, ProBNP   Testing/Procedures: None Ordered   Follow-Up: At [facility], you and your health needs are our priority.  As part of our continuing mission to provide you with exceptional heart care, we have created designated Provider Care Teams.  These Care Teams include your primary Cardiologist (physician) and Advanced Practice Providers (APPs -  Physician Assistants and Nurse Practitioners) who all work together to provide you with the care you need, when you need it.  We recommend signing up for the patient portal called MyChart.  Sign up information is provided on this After Visit Summary.  MyChart is used to connect with patients for Virtual Visits (Telemedicine).  Patients are able to view lab/test results, encounter notes, upcoming appointments, etc.  Non-urgent messages can be sent to your provider as well.   To learn more about what you can do with MyChart, go to [email or web address].    Your next appointment:   6 month(s)  The format for your next appointment:   In Person  Provider:   Lamar Fitch, MD    Other Instructions NA

## 2024-05-03 NOTE — Progress Notes (Signed)
 Cardiology Office Note:    Date:  05/03/2024   ID:  Ashley Miles, DOB 1945-09-25, MRN [id number]  PCP:  Ashley Therisa RAMAN, NP  Cardiologist:  Lamar Fitch, MD    Referring MD: Ashley Therisa RAMAN, NP   Chief Complaint  Patient presents with   Follow-up    History of Present Illness:    Ashley Miles is a 78 y.o. female past medical history significant for coronary disease status post cardiac catheterization done in 2019 which showed ostial RCA 80% stenosis proximal RCA with 70% stenosis ramus 60% second diagonal branch was completely occluded mid LAD 30% stenosis, that was done in patient for acute myocardial infarction, attempted to open up occluded diagonal branch was made but unsuccessful, she ended up being in cardiogenic shock with severely diminished left ventricular ejection fraction, after that she was put on guideline directed medical therapy with improvement of her left ventricular ejection fraction then she disappeared from follow-up she showed up recently to be reestablished as patient, overall she is doing well.  Denies have any chest pain tightness squeezing pressure mid chest no palpitation dizziness but does have some swelling of lower extremities specially evening time.  Also does have carotic arterial disease that he follow-up by vascular team from [facility]  Past Medical History:  Diagnosis Date   Acute blood loss anemia    Acute kidney failure    Acute systolic heart failure (HCC)    Anemia, unspecified 07/11/2020   Anxiety    Arthritis    Cardiogenic shock (HCC)    CHF (congestive heart failure) (HCC)    Chronic systolic heart failure (HCC) 03/03/2017   Cardiac catheterization 2018 showed diagonal branch occluded and right coronary artery stenosis however right coronary artery was very small, the feeling was that degree of coronary artery disease is low to her ejection fraction.   Chronic vascular disorders of intestine 04/23/2022   Cognitive communication  deficit 04/22/2022   COPD (chronic obstructive pulmonary disease) (HCC)    Coronary artery disease 03/03/2017   Cardiac catheterization June 2018 showing 90% small RCA, completely occluded diagonal branch. Unsuccessful attempt to open up diagonal branch.   Deficiency anemia 07/11/2020   Degenerative joint disease involving multiple joints on both sides of body 09/20/2022   Severe DDD and DJD of spine     Drug-induced constipation    Dyslipidemia 04/03/2017   Folate deficiency anemia, unspecified 04/21/2022   Gastric ulcer    GERD (gastroesophageal reflux disease)    Hiatal hernia    Hyperlipidemia    Hypertension    Ischemic cardiomyopathy 05/21/2018   Long term (current) use of antithrombotics/antiplatelets 05/03/2022   Lung cancer (HCC) 03/03/2017   Muscle wasting and atrophy, not elsewhere classified, unspecified site 04/21/2022   NSTEMI (non-ST elevated myocardial infarction) (HCC) 01/09/2017   Oral phase dysphagia 04/21/2022   Osteopenia    Peripheral vascular disease, unspecified 08/10/2021   Small cell lung cancer (HCC)    Smoking 11/20/2017   Stricture of artery 04/23/2022   T12 compression fracture (HCC)    Tobacco use    Wedge compression fracture of first lumbar vertebra, subsequent encounter for fracture with routine healing 05/03/2022    Past Surgical History:  Procedure Laterality Date   CESAREAN SECTION     ESOPHAGOGASTRODUODENOSCOPY  02/17/2017   Healing of the gastic ulcer- no antral scar. Small hiatal hernia   LEFT HEART CATH AND CORONARY ANGIOGRAPHY N/A 01/09/2017   Procedure: Left Heart Cath and Coronary Angiography;  Surgeon: Verlin,  Lonni BIRCH, MD;  Location: [facility] INVASIVE CV LAB;  Service: Cardiovascular;  Laterality: N/A;   LUNG CANCER SURGERY  2014    Current Medications: Current Meds  Medication Sig   albuterol  (PROVENTIL ) (2.5 MG/3ML) 0.083% nebulizer solution Take 2.5 mg by nebulization every 6 (six) hours as needed for wheezing or shortness of  breath.   albuterol  (VENTOLIN  HFA) 108 (90 Base) MCG/ACT inhaler Inhale 2 puffs into the lungs as needed for shortness of breath.   clopidogrel  (PLAVIX ) 75 MG tablet Take 1 tablet (75 mg total) by mouth daily.   Cyanocobalamin  (VITAMIN B-12 PO) Take 1 tablet by mouth daily.   doxepin (SINEQUAN) 10 MG capsule Take 10 mg by mouth daily.   doxylamine, Sleep, (UNISOM) 25 MG tablet Take 25 mg by mouth at bedtime as needed for sleep.   ezetimibe  (ZETIA ) 10 MG tablet Take 1 tablet (10 mg total) by mouth daily.   Ferrous Sulfate (FEROSUL PO) Take 325 mg by mouth daily.   folic acid  (FOLVITE ) 1 MG tablet Take 1 tablet (1 mg total) by mouth daily.   furosemide  (LASIX ) 20 MG tablet Take 1 tablet (20 mg total) by mouth daily.   ipratropium-albuterol  (DUONEB) 0.5-2.5 (3) MG/3ML SOLN Take 3 mLs by nebulization every 6 (six) hours as needed (SOB or wheezing).   lubiprostone (AMITIZA) 24 MCG capsule Take 24 mcg by mouth 2 (two) times daily with a meal.   metoprolol  succinate (TOPROL -XL) 25 MG 24 hr tablet Take 25 mg by mouth daily.   morphine  (MS CONTIN ) 15 MG 12 hr tablet Take 1 tablet (15 mg total) by mouth every 12 (twelve) hours.   naloxegol oxalate  (MOVANTIK ) 25 MG TABS tablet Take 25 mg by mouth daily.   nitroGLYCERIN  (NITROSTAT ) 0.4 MG SL tablet Place 1 tablet (0.4 mg total) under the tongue every 5 (five) minutes x 3 doses as needed for chest pain.   ondansetron  (ZOFRAN  ODT) 4 MG disintegrating tablet 4mg  ODT q4 hours prn nausea/vomit (Patient taking differently: Take 4 mg by mouth every 4 (four) hours as needed for nausea or vomiting. 4mg  ODT q4 hours prn nausea/vomit)   oxyCODONE -acetaminophen  (PERCOCET) 10-325 MG tablet Take 1 tablet by mouth every 4 (four) hours as needed for pain.   pantoprazole  (PROTONIX ) 40 MG tablet Take 40 mg by mouth 2 (two) times daily.   polyethylene glycol (MIRALAX / GLYCOLAX) packet Take 17 g by mouth daily.   potassium chloride  (K-DUR) 10 MEQ tablet Take 20 mEq by mouth  daily.   prednisoLONE acetate (PRED FORTE) 1 % ophthalmic suspension Place 1 drop into both eyes 4 (four) times daily.   prochlorperazine (COMPAZINE) 10 MG tablet Take 10 mg by mouth 3 (three) times daily as needed for nausea/vomiting, nausea or vomiting.   rosuvastatin  (CRESTOR ) 40 MG tablet Take 1 tablet (40 mg total) by mouth daily.   tiZANidine (ZANAFLEX) 4 MG tablet Take 4 mg by mouth at bedtime as needed for muscle spasms.   traZODone (DESYREL) 100 MG tablet Take 100 mg by mouth at bedtime.   TRELEGY ELLIPTA 200-62.5-25 MCG/ACT AEPB Inhale 1 puff into the lungs daily.   Vitamin D , Ergocalciferol , (DRISDOL) 50000 units CAPS capsule Take 50,000 Units by mouth every 7 (seven) days.     Allergies:   Patient has no known allergies.   Social History   Socioeconomic History   Marital status: Divorced    Spouse name: Not on file   Number of children: Not on file   Years of education:  Not on file   Highest education level: Not on file  Occupational History   Not on file  Tobacco Use   Smoking status: Former    Current packs/day: 0.00    Types: Cigarettes    Quit date: 06/05/2021    Years since quitting: 2.9   Smokeless tobacco: Never  Vaping Use   Vaping status: Former  Substance and Sexual Activity   Alcohol use: No   Drug use: No   Sexual activity: Not on file  Other Topics Concern   Not on file  Social History Narrative   Not on file   Social Drivers of Health   Financial Resource Strain: Not on file  Food Insecurity: Not on file  Transportation Needs: Not on file  Physical Activity: Not on file  Stress: Not on file  Social Connections: Not on file     Family History: The patient's family history includes Blindness in her mother; Cancer in her father; Hypertension in her father. There is no history of Colon cancer or Esophageal cancer. ROS:   Please see the history of present illness.    All 14 point review of systems negative except as described per history of  present illness  EKGs/Labs/Other Studies Reviewed:    EKG Interpretation Date/Time:  Monday May 03 2024 15:48:32 EDT Ventricular Rate:  87 PR Interval:  146 QRS Duration:  78 QT Interval:  370 QTC Calculation: 445 R Axis:   92  Text Interpretation: Normal sinus rhythm with sinus arrhythmia Rightward axis Nonspecific T wave abnormality Abnormal ECG When compared with ECG of 07-Aug-2020 09:46, PREVIOUS ECG IS PRESENT Confirmed by Bernie Charleston [phone]) on 05/03/2024 3:53:24 PM    Recent Labs: 03/12/2024: ALT 12; BUN 16; Creatinine 1.39; Hemoglobin 11.7; Platelet Count 262; Potassium 4.6; Sodium 139  Recent Lipid Panel    Component Value Date/Time   CHOL 204 (H) 10/04/2022 1546   TRIG 230 (H) 10/04/2022 1546   HDL 50 10/04/2022 1546   CHOLHDL 4.1 10/04/2022 1546   CHOLHDL 5.0 01/11/2017 0521   VLDL 27 01/11/2017 0521   LDLCALC 114 (H) 10/04/2022 1546    Physical Exam:    VS:  BP (!) 140/78   Pulse 87   Ht 5' 6 (1.676 m)   Wt 197 lb (89.4 kg)   SpO2 94%   BMI 31.80 kg/m     Wt Readings from Last 3 Encounters:  05/03/24 197 lb (89.4 kg)  03/12/24 189 lb 4.8 oz (85.9 kg)  10/29/23 185 lb 3.2 oz (84 kg)     GEN:  Well nourished, well developed in no acute distress HEENT: Normal NECK: No JVD; No carotid bruits LYMPHATICS: No lymphadenopathy CARDIAC: RRR, no murmurs, no rubs, no gallops RESPIRATORY:  Clear to auscultation without rales, wheezing or rhonchi  ABDOMEN: Soft, non-tender, non-distended MUSCULOSKELETAL:  No edema; No deformity  SKIN: Warm and dry LOWER EXTREMITIES: no swelling NEUROLOGIC:  Alert and oriented x 3 PSYCHIATRIC:  Normal affect   ASSESSMENT:    1. Coronary artery disease involving native coronary artery of native heart without angina pectoris   2. Acute systolic heart failure (HCC)   3. Chronic combined systolic and diastolic congestive heart failure (HCC)   4. Ischemic cardiomyopathy   5. Peripheral vascular disease, unspecified    6. 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 Dyslipidemia    PLAN:    In order of problems listed above:  Coronary disease stable from that point review  asymptomatic, guideline-directed medical therapy will check History of cardiomyopathy with normalization of left ventricular ejection fraction.  Will recheck left ventricle ejection fraction on echocardiogram. Swelling of lower extremity with double dose of Lasix  and potassium will check Chem-7 proBNP in about a week. Dyslipidemia I did review [facility] which show me her LDL of 98 HDL 42.  This is on maximal Crestor  and Zetia  I will switch Zetia  to Nexlizet.   Medication Adjustments/Labs and Tests Ordered: Current medicines are reviewed at length with the patient today.  Concerns regarding medicines are outlined above.  Orders Placed This Encounter  Procedures   EKG 12-Lead   Medication changes: No orders of the defined types were placed in this encounter.   Signed, Lamar DOROTHA Fitch, MD, [facility] 05/03/2024 4:03 PM    [facility]

## 2024-05-20 ENCOUNTER — Telehealth: Payer: Self-pay

## 2024-05-20 ENCOUNTER — Other Ambulatory Visit: Payer: Self-pay

## 2024-05-20 MED ORDER — BEMPEDOIC ACID 180 MG PO TABS: 180.0000 mg | ORAL_TABLET | Freq: Every day | ORAL | 0 refills | Status: DC

## 2024-05-20 MED ORDER — NEXLIZET 180-10 MG PO TABS: 1.0000 | ORAL_TABLET | Freq: Every day | ORAL | 3 refills | Status: DC

## 2024-05-20 NOTE — Telephone Encounter (Signed)
 Stop Zetia  add Nexlizet per Dr. Krasowski

## 2024-05-21 ENCOUNTER — Telehealth: Payer: Self-pay

## 2024-05-21 DIAGNOSIS — E785 Hyperlipidemia, unspecified: Secondary | ICD-10-CM

## 2024-05-21 LAB — BASIC METABOLIC PANEL WITH GFR
BUN/Creatinine Ratio: 13 (ref 12–28)
BUN: 18 mg/dL (ref 8–27)
CO2: 23 mmol/L (ref 20–29)
Calcium: 9.5 mg/dL (ref 8.7–10.3)
Chloride: 99 mmol/L (ref 96–106)
Creatinine, Ser: 1.35 mg/dL — ABNORMAL HIGH (ref 0.57–1.00)
Glucose: 107 mg/dL — ABNORMAL HIGH (ref 70–99)
Potassium: 4.6 mmol/L (ref 3.5–5.2)
Sodium: 138 mmol/L (ref 134–144)
eGFR: 40 mL/min/1.73 — ABNORMAL LOW (ref >59)

## 2024-05-21 LAB — PRO B NATRIURETIC PEPTIDE: NT-Pro BNP: 1966 pg/mL — ABNORMAL HIGH (ref 0–738)

## 2024-05-21 NOTE — Telephone Encounter (Signed)
 Spoke with pts daughter regarding Nexlizet. Advised per Dr. Bernie to stop Zetia  and start Nexlizet then recheck Lipid, AST, ALT in 6 weeks

## 2024-05-25 ENCOUNTER — Other Ambulatory Visit: Payer: Self-pay

## 2024-05-25 DIAGNOSIS — Z8739 Personal history of other diseases of the musculoskeletal system and connective tissue: Secondary | ICD-10-CM

## 2024-05-25 DIAGNOSIS — M479 Spondylosis, unspecified: Secondary | ICD-10-CM

## 2024-05-25 DIAGNOSIS — M159 Polyosteoarthritis, unspecified: Secondary | ICD-10-CM

## 2024-05-25 DIAGNOSIS — G8929 Other chronic pain: Secondary | ICD-10-CM

## 2024-05-25 MED ORDER — OXYCODONE-ACETAMINOPHEN 10-325 MG PO TABS: 1.0000 | ORAL_TABLET | ORAL | 0 refills | Status: DC | PRN

## 2024-06-10 ENCOUNTER — Telehealth: Payer: Self-pay | Admitting: Cardiology

## 2024-06-10 MED ORDER — NEXLIZET 180-10 MG PO TABS: 1.0000 | ORAL_TABLET | Freq: Every day | ORAL | 3 refills | Status: AC

## 2024-06-10 NOTE — Telephone Encounter (Signed)
 RX has been sent.

## 2024-06-10 NOTE — Telephone Encounter (Signed)
 Pts daughter states they received samples of a cholesterol medication for pt. She is not sure name of medication. Samples are done and she thought their would be a Rx sent it, but it wasn't. Requesting callback to discuss. Please advise.

## 2024-06-11 ENCOUNTER — Telehealth: Payer: Self-pay | Admitting: Pharmacy Technician

## 2024-06-11 ENCOUNTER — Other Ambulatory Visit (HOSPITAL_COMMUNITY): Payer: Self-pay

## 2024-06-11 NOTE — Telephone Encounter (Signed)
   Pharmacy Patient Advocate Encounter   Received notification from CoverMyMeds that prior authorization for nexlizet is required/requested.   Insurance verification completed.   The patient is insured through [facility].   Per test claim: PA required; PA submitted to above mentioned insurance via Latent Key/confirmation #/EOC [id number] Status is pending

## 2024-06-11 NOTE — Telephone Encounter (Signed)
 Pharmacy Patient Advocate Encounter  Received notification from [employer] that Prior Authorization for nexlizet has been APPROVED from 06/11/24 to 12/09/24   PA #/Case ID/Reference #: [id number]    Notified pharmacy

## 2024-06-24 ENCOUNTER — Other Ambulatory Visit: Payer: Self-pay

## 2024-06-24 DIAGNOSIS — G8929 Other chronic pain: Secondary | ICD-10-CM

## 2024-06-24 DIAGNOSIS — M479 Spondylosis, unspecified: Secondary | ICD-10-CM

## 2024-06-24 DIAGNOSIS — Z8739 Personal history of other diseases of the musculoskeletal system and connective tissue: Secondary | ICD-10-CM

## 2024-06-24 DIAGNOSIS — M159 Polyosteoarthritis, unspecified: Secondary | ICD-10-CM

## 2024-06-24 MED ORDER — MORPHINE SULFATE ER 15 MG PO TBCR: 15.0000 mg | EXTENDED_RELEASE_TABLET | Freq: Two times a day (BID) | ORAL | 0 refills | Status: DC

## 2024-06-24 MED ORDER — OXYCODONE-ACETAMINOPHEN 10-325 MG PO TABS: 1.0000 | ORAL_TABLET | ORAL | 0 refills | Status: DC | PRN

## 2024-07-21 ENCOUNTER — Other Ambulatory Visit: Payer: Self-pay

## 2024-07-21 DIAGNOSIS — G8929 Other chronic pain: Secondary | ICD-10-CM

## 2024-07-21 DIAGNOSIS — M159 Polyosteoarthritis, unspecified: Secondary | ICD-10-CM

## 2024-07-21 DIAGNOSIS — M479 Spondylosis, unspecified: Secondary | ICD-10-CM

## 2024-07-21 DIAGNOSIS — Z8739 Personal history of other diseases of the musculoskeletal system and connective tissue: Secondary | ICD-10-CM

## 2024-07-21 MED ORDER — MORPHINE SULFATE ER 15 MG PO TBCR: 15.0000 mg | EXTENDED_RELEASE_TABLET | Freq: Two times a day (BID) | ORAL | 0 refills | Status: DC

## 2024-07-21 MED ORDER — OXYCODONE-ACETAMINOPHEN 10-325 MG PO TABS: 1.0000 | ORAL_TABLET | ORAL | 0 refills | Status: DC | PRN

## 2024-08-01 DIAGNOSIS — I10 Essential (primary) hypertension: Secondary | ICD-10-CM | POA: Diagnosis not present

## 2024-08-01 DIAGNOSIS — I5030 Unspecified diastolic (congestive) heart failure: Secondary | ICD-10-CM | POA: Diagnosis not present

## 2024-08-01 DIAGNOSIS — R0602 Shortness of breath: Secondary | ICD-10-CM | POA: Diagnosis not present

## 2024-08-01 DIAGNOSIS — I2510 Atherosclerotic heart disease of native coronary artery without angina pectoris: Secondary | ICD-10-CM | POA: Diagnosis not present

## 2024-08-02 DIAGNOSIS — J441 Chronic obstructive pulmonary disease with (acute) exacerbation: Secondary | ICD-10-CM | POA: Diagnosis not present

## 2024-08-02 DIAGNOSIS — J811 Chronic pulmonary edema: Secondary | ICD-10-CM | POA: Diagnosis not present

## 2024-08-02 DIAGNOSIS — I2510 Atherosclerotic heart disease of native coronary artery without angina pectoris: Secondary | ICD-10-CM | POA: Diagnosis not present

## 2024-08-02 DIAGNOSIS — J9600 Acute respiratory failure, unspecified whether with hypoxia or hypercapnia: Secondary | ICD-10-CM | POA: Diagnosis not present

## 2024-08-03 DIAGNOSIS — J811 Chronic pulmonary edema: Secondary | ICD-10-CM | POA: Diagnosis not present

## 2024-08-03 DIAGNOSIS — J441 Chronic obstructive pulmonary disease with (acute) exacerbation: Secondary | ICD-10-CM | POA: Diagnosis not present

## 2024-08-03 DIAGNOSIS — J9600 Acute respiratory failure, unspecified whether with hypoxia or hypercapnia: Secondary | ICD-10-CM | POA: Diagnosis not present

## 2024-08-03 DIAGNOSIS — I2510 Atherosclerotic heart disease of native coronary artery without angina pectoris: Secondary | ICD-10-CM | POA: Diagnosis not present

## 2024-08-11 DIAGNOSIS — J181 Lobar pneumonia, unspecified organism: Secondary | ICD-10-CM | POA: Insufficient documentation

## 2024-08-18 ENCOUNTER — Telehealth: Payer: Self-pay

## 2024-08-18 ENCOUNTER — Other Ambulatory Visit: Payer: Self-pay

## 2024-08-18 DIAGNOSIS — G8929 Other chronic pain: Secondary | ICD-10-CM

## 2024-08-18 DIAGNOSIS — M159 Polyosteoarthritis, unspecified: Secondary | ICD-10-CM

## 2024-08-18 DIAGNOSIS — M479 Spondylosis, unspecified: Secondary | ICD-10-CM

## 2024-08-18 DIAGNOSIS — Z8739 Personal history of other diseases of the musculoskeletal system and connective tissue: Secondary | ICD-10-CM

## 2024-08-18 MED ORDER — MORPHINE SULFATE ER 15 MG PO TBCR: 15.0000 mg | EXTENDED_RELEASE_TABLET | Freq: Two times a day (BID) | ORAL | 0 refills | Status: AC

## 2024-08-18 MED ORDER — OXYCODONE-ACETAMINOPHEN 10-325 MG PO TABS: 1.0000 | ORAL_TABLET | ORAL | 0 refills | Status: AC | PRN

## 2024-08-18 NOTE — Telephone Encounter (Signed)
 Message left medication requested was refilled. (Morphine )

## 2024-09-03 ENCOUNTER — Encounter: Payer: Self-pay | Admitting: Cardiology

## 2024-09-03 ENCOUNTER — Ambulatory Visit: Attending: Cardiology | Admitting: Cardiology

## 2024-09-03 VITALS — BP 110/60 | HR 103 | Ht 66.0 in | Wt 187.0 lb

## 2024-09-03 DIAGNOSIS — C3412 Malignant neoplasm of upper lobe, left bronchus or lung: Secondary | ICD-10-CM

## 2024-09-03 DIAGNOSIS — J449 Chronic obstructive pulmonary disease, unspecified: Secondary | ICD-10-CM | POA: Diagnosis not present

## 2024-09-03 DIAGNOSIS — I5042 Chronic combined systolic (congestive) and diastolic (congestive) heart failure: Secondary | ICD-10-CM | POA: Diagnosis not present

## 2024-09-03 DIAGNOSIS — E785 Hyperlipidemia, unspecified: Secondary | ICD-10-CM | POA: Diagnosis not present

## 2024-09-03 DIAGNOSIS — I2510 Atherosclerotic heart disease of native coronary artery without angina pectoris: Secondary | ICD-10-CM

## 2024-09-03 NOTE — Patient Instructions (Signed)
 Medication Instructions:  Your physician recommends that you continue on your current medications as directed. Please refer to the Current Medication list given to you today.  *If you need a refill on your cardiac medications before your next appointment, please call your pharmacy*   Lab Work: None Ordered If you have labs (blood work) drawn today and your tests are completely normal, you will receive your results only by: MyChart Message (if you have MyChart) OR A paper copy in the mail If you have any lab test that is abnormal or we need to change your treatment, we will call you to review the results.   Testing/Procedures: None Ordered   Follow-Up: At [facility], you and your health needs are our priority.  As part of our continuing mission to provide you with exceptional heart care, we have created designated Provider Care Teams.  These Care Teams include your primary Cardiologist (physician) and Advanced Practice Providers (APPs -  Physician Assistants and Nurse Practitioners) who all work together to provide you with the care you need, when you need it.  We recommend signing up for the patient portal called MyChart.  Sign up information is provided on this After Visit Summary.  MyChart is used to connect with patients for Virtual Visits (Telemedicine).  Patients are able to view lab/test results, encounter notes, upcoming appointments, etc.  Non-urgent messages can be sent to your provider as well.   To learn more about what you can do with MyChart, go to [email or web address].    Your next appointment:   10 day(s)  The format for your next appointment:   In Person  Provider:   Lamar Fitch, MD    Other Instructions NA

## 2024-09-10 ENCOUNTER — Telehealth: Payer: Self-pay | Admitting: Cardiology

## 2024-09-10 NOTE — Telephone Encounter (Signed)
 Patient's daughter Luke is asking if patient was to discontinue potassium. Informed Kim the only documentation seen regarding medication changes was in October Dr. Bernie recommended stopping Zetia  and starting Nexlizet .  Potassium was increased to 20 meq daily at office visit in September, and advised to continue taking as prescribed at office visit on 09/03/24.  Reviewed medications with Luke, she verbalized understanding and expressed appreciation for call.

## 2024-09-10 NOTE — Telephone Encounter (Signed)
 Pt's daughter wants to know if pt is supposed to stop taking potassium chloride  because she is also taking Lasix . Pt's daughter would like a c/b regarding this matter. Please advise.

## 2024-09-14 ENCOUNTER — Ambulatory Visit: Admitting: Cardiology

## 2024-10-14 ENCOUNTER — Other Ambulatory Visit

## 2024-10-14 ENCOUNTER — Other Ambulatory Visit (HOSPITAL_BASED_OUTPATIENT_CLINIC_OR_DEPARTMENT_OTHER): Admitting: Radiology

## 2024-10-21 ENCOUNTER — Ambulatory Visit: Admitting: Oncology
